# Patient Record
Sex: Female | Born: 1985
Health system: Southern US, Community
[De-identification: ages and names within clinical notes are randomized; demographics above are authoritative.]

## PROBLEM LIST (undated history)

## (undated) ENCOUNTER — Inpatient Hospital Stay (HOSPITAL_COMMUNITY): Payer: Self-pay

## (undated) DIAGNOSIS — Z5189 Encounter for other specified aftercare: Secondary | ICD-10-CM

## (undated) DIAGNOSIS — IMO0002 Reserved for concepts with insufficient information to code with codable children: Secondary | ICD-10-CM

## (undated) HISTORY — DX: Encounter for other specified aftercare: Z51.89

## (undated) HISTORY — PX: WISDOM TOOTH EXTRACTION: SHX21

## (undated) HISTORY — DX: Reserved for concepts with insufficient information to code with codable children: IMO0002

---

## 2006-04-23 DIAGNOSIS — IMO0002 Reserved for concepts with insufficient information to code with codable children: Secondary | ICD-10-CM

## 2006-04-23 DIAGNOSIS — R87619 Unspecified abnormal cytological findings in specimens from cervix uteri: Secondary | ICD-10-CM

## 2006-04-23 HISTORY — DX: Unspecified abnormal cytological findings in specimens from cervix uteri: R87.619

## 2006-04-23 HISTORY — PX: COLPOSCOPY: SHX161

## 2006-04-23 HISTORY — DX: Reserved for concepts with insufficient information to code with codable children: IMO0002

## 2006-05-22 ENCOUNTER — Other Ambulatory Visit: Admission: RE | Admit: 2006-05-22 | Discharge: 2006-05-22 | Payer: Self-pay | Admitting: Obstetrics & Gynecology

## 2006-12-04 ENCOUNTER — Other Ambulatory Visit: Admission: RE | Admit: 2006-12-04 | Discharge: 2006-12-04 | Payer: Self-pay | Admitting: Obstetrics & Gynecology

## 2007-04-25 ENCOUNTER — Other Ambulatory Visit: Admission: RE | Admit: 2007-04-25 | Discharge: 2007-04-25 | Payer: Self-pay | Admitting: Obstetrics & Gynecology

## 2007-10-23 ENCOUNTER — Emergency Department (HOSPITAL_COMMUNITY): Admission: EM | Admit: 2007-10-23 | Discharge: 2007-10-23 | Payer: Self-pay | Admitting: Family Medicine

## 2007-11-06 ENCOUNTER — Other Ambulatory Visit: Admission: RE | Admit: 2007-11-06 | Discharge: 2007-11-06 | Payer: Self-pay | Admitting: Obstetrics and Gynecology

## 2008-04-26 ENCOUNTER — Other Ambulatory Visit: Admission: RE | Admit: 2008-04-26 | Discharge: 2008-04-26 | Payer: Self-pay | Admitting: Obstetrics & Gynecology

## 2008-08-24 ENCOUNTER — Emergency Department (HOSPITAL_COMMUNITY): Admission: EM | Admit: 2008-08-24 | Discharge: 2008-08-24 | Payer: Self-pay | Admitting: Family Medicine

## 2008-10-09 ENCOUNTER — Emergency Department (HOSPITAL_COMMUNITY): Admission: EM | Admit: 2008-10-09 | Discharge: 2008-10-09 | Payer: Self-pay | Admitting: Family Medicine

## 2011-01-18 LAB — CBC
HCT: 41.9
Hemoglobin: 14.4
RBC: 4.54
RDW: 11.9

## 2011-01-18 LAB — POCT RAPID STREP A: Streptococcus, Group A Screen (Direct): NEGATIVE

## 2012-08-15 ENCOUNTER — Encounter: Payer: Self-pay | Admitting: *Deleted

## 2012-08-18 ENCOUNTER — Encounter: Payer: Self-pay | Admitting: Nurse Practitioner

## 2012-08-18 ENCOUNTER — Ambulatory Visit (INDEPENDENT_AMBULATORY_CARE_PROVIDER_SITE_OTHER): Payer: 59 | Admitting: Nurse Practitioner

## 2012-08-18 VITALS — BP 102/70 | HR 68 | Resp 16 | Ht 64.0 in | Wt 134.0 lb

## 2012-08-18 DIAGNOSIS — Z113 Encounter for screening for infections with a predominantly sexual mode of transmission: Secondary | ICD-10-CM

## 2012-08-18 DIAGNOSIS — Z202 Contact with and (suspected) exposure to infections with a predominantly sexual mode of transmission: Secondary | ICD-10-CM

## 2012-08-18 LAB — STD PANEL: HIV: NONREACTIVE

## 2012-08-18 NOTE — Patient Instructions (Signed)
Sexually Transmitted Disease  Sexually transmitted disease (STD) refers to any infection that is passed from person to person during sexual activity. This may happen by way of saliva, semen, blood, vaginal mucus, or urine. Common STDs include:   Gonorrhea.   Chlamydia.   Syphilis.   HIV/AIDS.   Genital herpes.   Hepatitis B and C.   Trichomonas.   Human papillomavirus (HPV).   Pubic lice.  CAUSES   An STD may be spread by bacteria, virus, or parasite. A person can get an STD by:   Sexual intercourse with an infected person.   Sharing sex toys with an infected person.   Sharing needles with an infected person.   Having intimate contact with the genitals, mouth, or rectal areas of an infected person.  SYMPTOMS   Some people may not have any symptoms, but they can still pass the infection to others. Different STDs have different symptoms. Symptoms include:   Painful or bloody urination.   Pain in the pelvis, abdomen, vagina, anus, throat, or eyes.   Skin rash, itching, irritation, growths, or sores (lesions). These usually occur in the genital or anal area.   Abnormal vaginal discharge.   Penile discharge in men.   Soft, flesh-colored skin growths in the genital or anal area.   Fever.   Pain or bleeding during sexual intercourse.   Swollen glands in the groin area.   Yellow skin and eyes (jaundice). This is seen with hepatitis.  DIAGNOSIS   To make a diagnosis, your caregiver may:   Take a medical history.   Perform a physical exam.   Take a specimen (culture) to be examined.   Examine a sample of discharge under a microscope.   Perform blood tests.   Perform a Pap test, if this applies.   Perform a colposcopy.   Perform a laparoscopy.  TREATMENT    Chlamydia, gonorrhea, trichomonas, and syphilis can be cured with antibiotic medicine.   Genital herpes, hepatitis, and HIV can be treated, but not cured, with prescribed medicines. The medicines will lessen the symptoms.   Genital warts  from HPV can be treated with medicine or by freezing, burning (electrocautery), or surgery. Warts may come back.   HPV is a virus and cannot be cured with medicine or surgery.However, abnormal areas may be followed very closely by your caregiver and may be removed from the cervix, vagina, or vulva through office procedures or surgery.  If your diagnosis is confirmed, your recent sexual partners need treatment. This is true even if they are symptom-free or have a negative culture or evaluation. They should not have sex until their caregiver says it is okay.  HOME CARE INSTRUCTIONS   All sexual partners should be informed, tested, and treated for all STDs.   Take your antibiotics as directed. Finish them even if you start to feel better.   Only take over-the-counter or prescription medicines for pain, discomfort, or fever as directed by your caregiver.   Rest.   Eat a balanced diet and drink enough fluids to keep your urine clear or pale yellow.   Do not have sex until treatment is completed and you have followed up with your caregiver. STDs should be checked after treatment.   Keep all follow-up appointments, Pap tests, and blood tests as directed by your caregiver.   Only use latex condoms and water-soluble lubricants during sexual activity. Do not use petroleum jelly or oils.   Avoid alcohol and illegal drugs.   Get vaccinated   for HPV and hepatitis. If you have not received these vaccines in the past, talk to your caregiver about whether one or both might be right for you.   Avoid risky sex practices that can break the skin.  The only way to avoid getting an STD is to avoid all sexual activity.Latex condoms and dental dams (for oral sex) will help lessen the risk of getting an STD, but will not completely eliminate the risk.  SEEK MEDICAL CARE IF:    You have a fever.   You have any new or worsening symptoms.  Document Released: 06/30/2002 Document Revised: 07/02/2011 Document Reviewed:  07/07/2010  [employer] Patient Information 2013 [employer].

## 2012-08-18 NOTE — Progress Notes (Signed)
Subjective:     Patient ID: Lindsey Sellers, female   DOB: August 04, 1985, 27 y.o.   MRN: [id number]  Exposure to STD  The patient's pertinent negatives include no discharge, dyspareunia, dysuria, genital itching, genital lesions, genital rash or pelvic pain. Risk factors: Ended last relatioship of 5 years about 3 months ago.  Now concerned after hearing from other people, that he may not have been faithful.     Review of Systems  Constitutional: Negative.   Respiratory: Negative.   Cardiovascular: Negative.   Gastrointestinal: Negative.   Genitourinary: Positive for vaginal bleeding. Negative for dysuria, urgency, hematuria, vaginal discharge, genital sores, vaginal pain, pelvic pain and dyspareunia.       Menses normal on Loestrin 1/20.  While out of town last week for 2 days took OCP the next morning instead of the usual bedtime dosing.  Vaginal bleeding started this weekend.  Not sexually active X 3 mos.  Musculoskeletal: Negative.   Neurological: Negative.   Psychiatric/Behavioral: Negative.        Objective:   Physical Exam  Constitutional: She appears well-developed and well-nourished.  Cardiovascular: Normal rate.   Pulmonary/Chest: Effort normal.  Abdominal: Soft. Bowel sounds are normal. She exhibits no distension and no mass. There is no tenderness. There is no rebound and no guarding.  Genitourinary:  Light brown vaginal spotting. No pain of bimanual exam.  Musculoskeletal: Normal range of motion.  Neurological: She is alert.  Psychiatric: She has a normal mood and affect. Her behavior is normal. Judgment normal.       Assessment:     Asymptomatic R/O STD's   Ended 5 year relationship who may have been unfaithful Plan:     Will do STD screening and send patient results.

## 2012-08-19 NOTE — Progress Notes (Signed)
Encounter reviewed by Dr. Brook Silva.  

## 2012-08-20 ENCOUNTER — Telehealth: Payer: Self-pay | Admitting: Nurse Practitioner

## 2012-08-20 LAB — IPS N GONORRHOEA AND CHLAMYDIA BY PCR

## 2012-08-20 NOTE — Telephone Encounter (Signed)
Pt is aware of negative (blood) lab results.

## 2012-08-20 NOTE — Telephone Encounter (Signed)
Patient is calling for recent results.  °

## 2012-08-21 ENCOUNTER — Telehealth: Payer: Self-pay | Admitting: *Deleted

## 2012-08-21 ENCOUNTER — Telehealth: Payer: Self-pay | Admitting: Nurse Practitioner

## 2012-08-21 NOTE — Telephone Encounter (Signed)
Encounter opened in error

## 2012-08-21 NOTE — Telephone Encounter (Signed)
LVM (2nd) for pt to return my call for results.  

## 2012-08-21 NOTE — Telephone Encounter (Signed)
Message copied by Osie Bond on Thu Aug 21, 2012 11:45 AM ------      Message from: Vangie Bicker A      Created: Thu Aug 21, 2012 10:52 AM      Regarding: Return Phone Call      Contact: [phone]       Patient is returning your call. ------

## 2012-08-22 ENCOUNTER — Telehealth: Payer: Self-pay | Admitting: *Deleted

## 2012-08-22 NOTE — Progress Notes (Signed)
LVM (2nd) for pt to return my call for results.

## 2012-08-22 NOTE — Telephone Encounter (Signed)
Pt is aware of lab results.

## 2012-08-22 NOTE — Telephone Encounter (Signed)
Message copied by Osie Bond on Fri Aug 22, 2012  4:33 PM ------      Message from: Ria Comment R      Created: Thu Aug 21, 2012  8:33 AM       Let pt. Know negative. ------

## 2012-08-22 NOTE — Progress Notes (Signed)
Pt is aware of lab results.

## 2012-08-22 NOTE — Telephone Encounter (Signed)
Message copied by Osie Bond on Fri Aug 22, 2012  4:33 PM ------      Message from: Ria Comment R      Created: Tue Aug 19, 2012  9:19 AM       Hold these results until Gc/Chl ------

## 2013-02-26 ENCOUNTER — Other Ambulatory Visit: Payer: Self-pay

## 2013-05-21 ENCOUNTER — Other Ambulatory Visit: Payer: Self-pay | Admitting: *Deleted

## 2013-05-21 MED ORDER — NORETHIN ACE-ETH ESTRAD-FE 1-20 MG-MCG PO TABS: 1.0000 | ORAL_TABLET | Freq: Every day | ORAL | Status: DC

## 2013-05-21 NOTE — Telephone Encounter (Signed)
Last AEX 05/2012 Last refill 06/16/2012 Next appt 06/18/2013  Will refill 1 month until appt.

## 2013-06-18 ENCOUNTER — Ambulatory Visit: Payer: Self-pay | Admitting: Nurse Practitioner

## 2013-06-29 ENCOUNTER — Ambulatory Visit (INDEPENDENT_AMBULATORY_CARE_PROVIDER_SITE_OTHER): Payer: 59 | Admitting: Certified Nurse Midwife

## 2013-06-29 ENCOUNTER — Encounter: Payer: Self-pay | Admitting: Certified Nurse Midwife

## 2013-06-29 VITALS — BP 114/77 | HR 83 | Resp 16 | Ht 63.75 in | Wt 138.0 lb

## 2013-06-29 DIAGNOSIS — Z309 Encounter for contraceptive management, unspecified: Secondary | ICD-10-CM

## 2013-06-29 DIAGNOSIS — Z01419 Encounter for gynecological examination (general) (routine) without abnormal findings: Secondary | ICD-10-CM

## 2013-06-29 MED ORDER — LEVONORGEST-ETH ESTRAD 91-DAY 0.15-0.03 &0.01 MG PO TABS: 1.0000 | ORAL_TABLET | Freq: Every day | ORAL | Status: DC

## 2013-06-29 NOTE — Progress Notes (Signed)
28 y.o. G0P0 Single Caucasian Fe here for annual exam. Periods slightly heavier and more PMS the past few cycles. No partner change, no STD concerns of testing desired. Patient would like to try extended cycle OCP to decrease PMS occurrence. Sees PCP prn. No other health issues today.    Patient's last menstrual period was 06/22/2013.          Sexually active: yes  The current method of family planning is OCP (estrogen/progesterone).    Exercising: yes  yoga,aerobics & walking Smoker:  no  Health Maintenance: Pap: 06-15-11 neg MMG:  none Colonoscopy:  none BMD:   none TDaP:  2010 Labs: none Self breast exam: occ   reports that she has never smoked. She has never used smokeless tobacco. She reports that she drinks about 0.5 ounces of alcohol per week. She reports that she does not use illicit drugs.  Past Medical History  Diagnosis Date  . Abnormal pap 04/2006  . 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 Colposcopy  2008    no biopsy done    Current Outpatient Prescriptions  Medication Sig Dispense Refill  . Multiple Vitamins-Minerals (MULTIVITAMIN PO) Take by mouth daily.      . norethindrone-ethinyl estradiol (JUNEL FE,GILDESS FE,LOESTRIN FE) 1-20 MG-MCG tablet Take 1 tablet by mouth daily.  1 Package  0   No current facility-administered medications for this visit.    Family History  Problem Relation Age of Onset  . Cancer Paternal Grandmother     lymph node    ROS:  Pertinent items are noted in HPI.  Otherwise, a comprehensive ROS was negative.  Exam:   BP 114/77  Pulse 83  Resp 16  Ht 5' 3.75" (1.619 m)  Wt 138 lb (62.596 kg)  BMI 23.88 kg/m2  LMP 06/22/2013 Height: 5' 3.75" (161.9 cm)  Ht Readings from Last 3 Encounters:  06/29/13 5' 3.75" (1.619 m)  08/18/12 5\' 4"  (1.626 m)    General appearance: alert, cooperative and appears stated age Head: Normocephalic,  without obvious abnormality, atraumatic Neck: no adenopathy, supple, symmetrical, trachea midline and thyroid normal to inspection and palpation and non-palpable Lungs: clear to auscultation bilaterally Breasts: normal appearance, no masses or tenderness, No nipple retraction or dimpling, No nipple discharge or bleeding, No axillary or supraclavicular adenopathy Heart: regular rate and rhythm Abdomen: soft, non-tender; no masses,  no organomegaly Extremities: extremities normal, atraumatic, no cyanosis or edema Skin: Skin color, texture, turgor normal. No rashes or lesions Lymph nodes: Cervical, supraclavicular, and axillary nodes normal. No abnormal inguinal nodes palpated Neurologic: Grossly normal   Pelvic: External genitalia:  no lesions              Urethra:  normal appearing urethra with no masses, tenderness or lesions              Bartholin's and Skene's: normal                 Vagina: normal appearing vagina with normal color and discharge, no lesions              Cervix: normal, non tender              Pap taken: no Bimanual Exam:  Uterus:  normal size, contour, position, consistency, mobility, non-tender and anteverted              Adnexa: normal adnexa and no  mass, fullness, tenderness               Rectovaginal: Confirms               Anus:  Deferred  A:  Well Woman with normal exam  Contraception desires extended cycle OCP trial, previous OCP use   History of abnormal pap LSIL in 2009 repeat pap follow up  P:   Reviewed health and wellness pertinent to exam  Rx Seasonique see order with instructions to keep menses calendar and advise if problems. Bleeding expectations given with extended cycle OCP. Questions addressed.  Pap smear as per guidelines   pap smear not taken today counseled on breast self exam, STD prevention, HIV risk factors and prevention, use and side effects of OCP's, adequate intake of calcium and vitamin D, diet and exercise  return annually or  prn  An After Visit Summary was printed and given to the patient.

## 2013-06-29 NOTE — Patient Instructions (Signed)
General topics  Next pap or exam is  due in 1 year Take a Women's multivitamin Take 1200 mg. of calcium daily - prefer dietary If any concerns in interim to call back  Breast Self-Awareness Practicing breast self-awareness may pick up problems early, prevent significant medical complications, and possibly save your life. By practicing breast self-awareness, you can become familiar with how your breasts look and feel and if your breasts are changing. This allows you to notice changes early. It can also offer you some reassurance that your breast health is good. One way to learn what is normal for your breasts and whether your breasts are changing is to do a breast self-exam. If you find a lump or something that was not present in the past, it is best to contact your caregiver right away. Other findings that should be evaluated by your caregiver include nipple discharge, especially if it is bloody; skin changes or reddening; areas where the skin seems to be pulled in (retracted); or new lumps and bumps. Breast pain is seldom associated with cancer (malignancy), but should also be evaluated by a caregiver. BREAST SELF-EXAM The best time to examine your breasts is 5 7 days after your menstrual period is over.  ExitCare Patient Information 2013 ExitCare[employer] LLC.   Exercise to Stay Healthy Exercise helps you become and stay healthy. EXERCISE IDEAS AND TIPS Choose exercises that:  You enjoy.  Fit into your day. You do not need to exercise really hard to be healthy. You can do exercises at a slow or medium level and stay healthy. You can:  Stretch before and after working out.  Try yoga, Pilates, or tai chi.  Lift weights.  Walk fast, swim, jog, run, climb stairs, bicycle, dance, or rollerskate.  Take aerobic classes. Exercises that burn about 150 calories:  Running 1  miles in 15 minutes.  Playing volleyball for 45 to 60 minutes.  Washing and waxing a car for 45 to 60  minutes.  Playing touch football for 45 minutes.  Walking 1  miles in 35 minutes.  Pushing a stroller 1  miles in 30 minutes.  Playing basketball for 30 minutes.  Raking leaves for 30 minutes.  Bicycling 5 miles in 30 minutes.  Walking 2 miles in 30 minutes.  Dancing for 30 minutes.  Shoveling snow for 15 minutes.  Swimming laps for 20 minutes.  Walking up stairs for 15 minutes.  Bicycling 4 miles in 15 minutes.  Gardening for 30 to 45 minutes.  Jumping rope for 15 minutes.  Washing windows or floors for 45 to 60 minutes. Document Released: 05/12/2010 Document Revised: 07/02/2011 Document Reviewed: 05/12/2010 ExitCare Patient Information 2013 ExitCare[employer] LLC.   Other topics ( that may be useful information):    Sexually Transmitted Disease Sexually transmitted disease (STD) refers to any infection that is passed from person to person during sexual activity. This may happen by way of saliva, semen, blood, vaginal mucus, or urine. Common STDs include:  Gonorrhea.  Chlamydia.  Syphilis.  HIV/AIDS.  Genital herpes.  Hepatitis B and C.  Trichomonas.  Human papillomavirus (HPV).  Pubic lice. CAUSES  An STD may be spread by bacteria, virus, or parasite. A person can get an STD by:  Sexual intercourse with an infected person.  Sharing sex toys with an infected person.  Sharing needles with an infected person.  Having intimate contact with the genitals, mouth, or rectal areas of an infected person. SYMPTOMS  Some people may not have any symptoms, but   they can still pass the infection to others. Different STDs have different symptoms. Symptoms include:  Painful or bloody urination.  Pain in the pelvis, abdomen, vagina, anus, throat, or eyes.  Skin rash, itching, irritation, growths, or sores (lesions). These usually occur in the genital or anal area.  Abnormal vaginal discharge.  Penile discharge in men.  Soft, flesh-colored skin growths in the  genital or anal area.  Fever.  Pain or bleeding during sexual intercourse.  Swollen glands in the groin area.  Yellow skin and eyes (jaundice). This is seen with hepatitis. DIAGNOSIS  To make a diagnosis, your caregiver may:  Take a medical history.  Perform a physical exam.  Take a specimen (culture) to be examined.  Examine a sample of discharge under a microscope.  Perform blood test TREATMENT   Chlamydia, gonorrhea, trichomonas, and syphilis can be cured with antibiotic medicine.  Genital herpes, hepatitis, and HIV can be treated, but not cured, with prescribed medicines. The medicines will lessen the symptoms.  Genital warts from HPV can be treated with medicine or by freezing, burning (electrocautery), or surgery. Warts may come back.  HPV is a virus and cannot be cured with medicine or surgery.However, abnormal areas may be followed very closely by your caregiver and may be removed from the cervix, vagina, or vulva through office procedures or surgery. If your diagnosis is confirmed, your recent sexual partners need treatment. This is true even if they are symptom-free or have a negative culture or evaluation. They should not have sex until their caregiver says it is okay. HOME CARE INSTRUCTIONS  All sexual partners should be informed, tested, and treated for all STDs.  Take your antibiotics as directed. Finish them even if you start to feel better.  Only take over-the-counter or prescription medicines for pain, discomfort, or fever as directed by your caregiver.  Rest.  Eat a balanced diet and drink enough fluids to keep your urine clear or pale yellow.  Do not have sex until treatment is completed and you have followed up with your caregiver. STDs should be checked after treatment.  Keep all follow-up appointments, Pap tests, and blood tests as directed by your caregiver.  Only use latex condoms and water-soluble lubricants during sexual activity. Do not use  petroleum jelly or oils.  Avoid alcohol and illegal drugs.  Get vaccinated for HPV and hepatitis. If you have not received these vaccines in the past, talk to your caregiver about whether one or both might be right for you.  Avoid risky sex practices that can break the skin. The only way to avoid getting an STD is to avoid all sexual activity.Latex condoms and dental dams (for oral sex) will help lessen the risk of getting an STD, but will not completely eliminate the risk. SEEK MEDICAL CARE IF:   You have a fever.  You have any new or worsening symptoms. Document Released: 06/30/2002 Document Revised: 07/02/2011 Document Reviewed: 07/07/2010 [facility] Patient Information 2013 [place].    Domestic Abuse You are being battered or abused if someone close to you hits, pushes, or physically hurts you in any way. You also are being abused if you are forced into activities. You are being sexually abused if you are forced to have sexual contact of any kind. You are being emotionally abused if you are made to feel worthless or if you are constantly threatened. It is important to remember that help is available. No one has the right to abuse you. PREVENTION OF FURTHER  ABUSE  Learn the warning signs of danger. This varies with situations but may include: the use of alcohol, threats, isolation from friends and family, or forced sexual contact. Leave if you feel that violence is going to occur.  If you are attacked or beaten, report it to the police so the abuse is documented. You do not have to press charges. The police can protect you while you or the attackers are leaving. Get the officer's name and badge number and a copy of the report.  Find someone you can trust and tell them what is happening to you: your caregiver, a nurse, clergy member, close friend or family member. Feeling ashamed is natural, but remember that you have done nothing wrong. No one deserves abuse. Document Released:  04/06/2000 Document Revised: 07/02/2011 Document Reviewed: 06/15/2010 ExitCare Patient Information 2013 ExitCare, LLC.    How Much is Too Much Alcohol? Drinking too much alcohol can cause injury, accidents, and health problems. These types of problems can include:   Car crashes.  Falls.  Family fighting (domestic violence).  Drowning.  Fights.  Injuries.  Burns.  Damage to certain organs.  Having a baby with birth defects. ONE DRINK CAN BE TOO MUCH WHEN YOU ARE:  Working.  Pregnant or breastfeeding.  Taking medicines. Ask your doctor.  Driving or planning to drive. If you or someone you know has a drinking problem, get help from a doctor.  Document Released: 02/03/2009 Document Revised: 07/02/2011 Document Reviewed: 02/03/2009 ExitCare Patient Information 2013 ExitCare, LLC.   Smoking Hazards Smoking cigarettes is extremely bad for your health. Tobacco smoke has over 200 known poisons in it. There are over 60 chemicals in tobacco smoke that cause cancer. Some of the chemicals found in cigarette smoke include:   Cyanide.  Benzene.  Formaldehyde.  Methanol (wood alcohol).  Acetylene (fuel used in welding torches).  Ammonia. Cigarette smoke also contains the poisonous gases nitrogen oxide and carbon monoxide.  Cigarette smokers have an increased risk of many serious medical problems and Smoking causes approximately:  90% of all lung cancer deaths in men.  80% of all lung cancer deaths in women.  90% of deaths from chronic obstructive lung disease. Compared with nonsmokers, smoking increases the risk of:  Coronary heart disease by 2 to 4 times.  Stroke by 2 to 4 times.  Men developing lung cancer by 23 times.  Women developing lung cancer by 13 times.  Dying from chronic obstructive lung diseases by 12 times.  . Smoking is the most preventable cause of death and disease in our society.  WHY IS SMOKING ADDICTIVE?  Nicotine is the chemical  agent in tobacco that is capable of causing addiction or dependence.  When you smoke and inhale, nicotine is absorbed rapidly into the bloodstream through your lungs. Nicotine absorbed through the lungs is capable of creating a powerful addiction. Both inhaled and non-inhaled nicotine may be addictive.  Addiction studies of cigarettes and spit tobacco show that addiction to nicotine occurs mainly during the teen years, when young people begin using tobacco products. WHAT ARE THE BENEFITS OF QUITTING?  There are many health benefits to quitting smoking.   Likelihood of developing cancer and heart disease decreases. Health improvements are seen almost immediately.  Blood pressure, pulse rate, and breathing patterns start returning to normal soon after quitting. QUITTING SMOKING   American Lung Association - 1-800-LUNGUSA  American Cancer Society - 1-800-ACS-2345 Document Released: 05/17/2004 Document Revised: 07/02/2011 Document Reviewed: 01/19/2009 ExitCare Patient Information 2013 ExitCare,   LLC.   Stress Management Stress is a state of physical or mental tension that often results from changes in your life or normal routine. Some common causes of stress are:  Death of a loved one.  Injuries or severe illnesses.  Getting fired or changing jobs.  Moving into a new home. Other causes may be:  Sexual problems.  Business or financial losses.  Taking on a large debt.  Regular conflict with someone at home or at work.  Constant tiredness from lack of sleep. It is not just bad things that are stressful. It may be stressful to:  Win the lottery.  Get married.  Buy a new car. The amount of stress that can be easily tolerated varies from person to person. Changes generally cause stress, regardless of the types of change. Too much stress can affect your health. It may lead to physical or emotional problems. Too little stress (boredom) may also become stressful. SUGGESTIONS TO  REDUCE STRESS:  Talk things over with your family and friends. It often is helpful to share your concerns and worries. If you feel your problem is serious, you may want to get help from a professional counselor.  Consider your problems one at a time instead of lumping them all together. Trying to take care of everything at once may seem impossible. List all the things you need to do and then start with the most important one. Set a goal to accomplish 2 or 3 things each day. If you expect to do too many in a single day you will naturally fail, causing you to feel even more stressed.  Do not use alcohol or drugs to relieve stress. Although you may feel better for a short time, they do not remove the problems that caused the stress. They can also be habit forming.  Exercise regularly - at least 3 times per week. Physical exercise can help to relieve that "uptight" feeling and will relax you.  The shortest distance between despair and hope is often a good night's sleep.  Go to bed and get up on time allowing yourself time for appointments without being rushed.  Take a short "time-out" period from any stressful situation that occurs during the day. Close your eyes and take some deep breaths. Starting with the muscles in your face, tense them, hold it for a few seconds, then relax. Repeat this with the muscles in your neck, shoulders, hand, stomach, back and legs.  Take good care of yourself. Eat a balanced diet and get plenty of rest.  Schedule time for having fun. Take a break from your daily routine to relax. HOME CARE INSTRUCTIONS   Call if you feel overwhelmed by your problems and feel you can no longer manage them on your own.  Return immediately if you feel like hurting yourself or someone else. Document Released: 10/03/2000 Document Revised: 07/02/2011 Document Reviewed: 05/26/2007 [facility] Patient Information 2013 [place].  Start new pack of pills today as discussed. Call with  any concerns, please call. Have a great spring! Debbi

## 2013-06-29 NOTE — Progress Notes (Signed)
Reviewed personally.  M. Suzanne Lisseth Brazeau, MD.  

## 2013-12-09 ENCOUNTER — Telehealth: Payer: Self-pay | Admitting: Nurse Practitioner

## 2013-12-09 NOTE — Telephone Encounter (Signed)
Patient wants to switch her bc

## 2013-12-09 NOTE — Telephone Encounter (Signed)
Left message to call Timouthy Gilardi at [phone]. 

## 2013-12-09 NOTE — Telephone Encounter (Signed)
There is not another oral pill option for 3 month periods. She can consider IUD or Nexplanon which can sometimes have no periods. Probably needs OV to discuss if she wants to change. Can schedule for next week

## 2013-12-09 NOTE — Telephone Encounter (Signed)
Spoke with patient. Patient states that she is on seasonique and is nearing the end of being on it for six months. Patient states that she is still having break through bleeding and has had increased weight gain. Patient would like to know if there is another option similar to the seasonique where she would only have a cycle once every three months where she would not have these symptoms. Would like to try something different. Advised would send a message over to Regina Eck CNM and give patient a call back with further recommendations. Patient agreeable.

## 2013-12-10 ENCOUNTER — Other Ambulatory Visit: Payer: Self-pay | Admitting: Certified Nurse Midwife

## 2013-12-10 ENCOUNTER — Telehealth: Payer: Self-pay | Admitting: Certified Nurse Midwife

## 2013-12-10 MED ORDER — LEVONORGEST-ETH ESTRAD 91-DAY 0.15-0.03 &0.01 MG PO TABS: 1.0000 | ORAL_TABLET | Freq: Every day | ORAL | Status: DC

## 2013-12-10 NOTE — Telephone Encounter (Signed)
Spoke with patient. Patient states that she called the pharmacy to see how much it was going to be for her brand only seasonique. States that it will be $300. Patient has a friend who is seen at our practice who is on Camrese. Patient would like to know if this would be an option of something she could try since it is also a three month pill. Advised would send a message over to Regina Eck CNM and give patient a call back with further recommendations and instructions. Patient agreeable.

## 2013-12-10 NOTE — Telephone Encounter (Signed)
Spoke with patient. Rx sent for Camrese #1 0RF to pharmacy on file. Advised patient to call back at the end of the three month pack to let us know how she is doing on the new OCP for refills. Patient agreeable and verbalizes understanding.  Routing to provider for final review. Patient agreeable to disposition. Will close encounter ;

## 2013-12-10 NOTE — Telephone Encounter (Signed)
That would be fine for her to try.

## 2013-12-10 NOTE — Telephone Encounter (Signed)
Patient is asking to talk with [facility] regarding her prescription.

## 2013-12-10 NOTE — Telephone Encounter (Signed)
Spoke with patient. Patient states that she is not interested in an IUD or nexplanon at this time. Patient states "Can't I be switched to the brand of this pill to see if that helps?" Advised patient could switch rx over to brand only if she would like. Patient would like to see if this will make a difference. Advised would send that rx in now. Patient would like to know how much it will cost per month. Advised will have to contact pharmacy so they can run it with her insurance and tell her how much it will be. Patient agreeable. Rx sent for [facility] with comment to dispense brand only rx.  Routing to provider for final review. Patient agreeable to disposition. Will close encounter.

## 2014-03-16 ENCOUNTER — Other Ambulatory Visit: Payer: Self-pay | Admitting: Certified Nurse Midwife

## 2014-03-17 NOTE — Telephone Encounter (Signed)
As of 12/10/13 patient switched to camrese and #1 pack was sent to pharmacy. The patient was supposed to give a update at the end of three months to let us know how she is doing.   Called patient and got update, she said she is feeling much better than the other pill she was on. Been feeling great on it, and she hasn't gained extra weight would like to continue.  Please advise [name] okay to send remainder of the year her last AEX was 06/29/13;  AEX scheduled for 07/01/14.  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

## 2014-04-07 ENCOUNTER — Encounter: Payer: Self-pay | Admitting: Family

## 2014-04-07 ENCOUNTER — Ambulatory Visit (INDEPENDENT_AMBULATORY_CARE_PROVIDER_SITE_OTHER): Payer: 59 | Admitting: Family

## 2014-04-07 VITALS — BP 118/78 | HR 81 | Temp 97.8°F | Resp 16 | Ht 64.5 in | Wt 143.4 lb

## 2014-04-07 DIAGNOSIS — Z0000 Encounter for general adult medical examination without abnormal findings: Secondary | ICD-10-CM

## 2014-04-07 LAB — URINALYSIS, ROUTINE W REFLEX MICROSCOPIC
BILIRUBIN URINE: NEGATIVE
Hgb urine dipstick: NEGATIVE
KETONES UR: NEGATIVE
Leukocytes, UA: NEGATIVE
Nitrite: NEGATIVE
PH: 6 (ref 5.0–8.0)
Total Protein, Urine: NEGATIVE
URINE GLUCOSE: NEGATIVE
Urobilinogen, UA: 0.2 (ref 0.0–1.0)

## 2014-04-07 LAB — HEPATIC FUNCTION PANEL
ALK PHOS: 28 U/L — AB (ref 39–117)
ALT: 21 U/L (ref 0–35)
AST: 16 U/L (ref 0–37)
Albumin: 4.4 g/dL (ref 3.5–5.2)
BILIRUBIN DIRECT: 0.1 mg/dL (ref 0.0–0.3)
Total Bilirubin: 0.3 mg/dL (ref 0.2–1.2)
Total Protein: 7.3 g/dL (ref 6.0–8.3)

## 2014-04-07 LAB — BASIC METABOLIC PANEL
BUN: 8 mg/dL (ref 6–23)
CALCIUM: 9.5 mg/dL (ref 8.4–10.5)
CO2: 24 meq/L (ref 19–32)
CREATININE: 0.7 mg/dL (ref 0.4–1.2)
Chloride: 105 mEq/L (ref 96–112)
GFR: 97.48 mL/min (ref >60.00)
Glucose, Bld: 72 mg/dL (ref 70–99)
Potassium: 4.1 mEq/L (ref 3.5–5.1)
Sodium: 139 mEq/L (ref 135–145)

## 2014-04-07 LAB — LIPID PANEL
CHOLESTEROL: 172 mg/dL (ref 0–200)
HDL: 69.1 mg/dL (ref >39.00)
LDL CALC: 80 mg/dL (ref 0–99)
NonHDL: 102.9
Total CHOL/HDL Ratio: 2
Triglycerides: 113 mg/dL (ref 0.0–149.0)
VLDL: 22.6 mg/dL (ref 0.0–40.0)

## 2014-04-07 LAB — CBC WITH DIFFERENTIAL/PLATELET
BASOS ABS: 0 10*3/uL (ref 0.0–0.1)
Basophils Relative: 0.3 % (ref 0.0–3.0)
EOS ABS: 0.1 10*3/uL (ref 0.0–0.7)
Eosinophils Relative: 1.4 % (ref 0.0–5.0)
HEMATOCRIT: 43.5 % (ref 36.0–46.0)
HEMOGLOBIN: 14.2 g/dL (ref 12.0–15.0)
LYMPHS ABS: 2 10*3/uL (ref 0.7–4.0)
Lymphocytes Relative: 28.3 % (ref 12.0–46.0)
MCHC: 32.6 g/dL (ref 30.0–36.0)
MCV: 96.9 fl (ref 78.0–100.0)
MONO ABS: 0.4 10*3/uL (ref 0.1–1.0)
MONOS PCT: 5.2 % (ref 3.0–12.0)
NEUTROS ABS: 4.7 10*3/uL (ref 1.4–7.7)
Neutrophils Relative %: 64.8 % (ref 43.0–77.0)
Platelets: 229 10*3/uL (ref 150.0–400.0)
RBC: 4.49 Mil/uL (ref 3.87–5.11)
RDW: 12.6 % (ref 11.5–15.5)
WBC: 7.2 10*3/uL (ref 4.0–10.5)

## 2014-04-07 LAB — TSH: TSH: 2.1 u[IU]/mL (ref 0.35–4.50)

## 2014-04-07 NOTE — Assessment & Plan Note (Signed)
Will obtain routine lab work. Immunizations reviewed up to date. Pap up to date- per gyn.

## 2014-04-07 NOTE — Progress Notes (Signed)
Subjective:    Patient ID: Lindsey Sellers, female    DOB: June 06, 1985, 28 y.o.   MRN: [id number]  HPI  [name] is a 28 yr old female who presents today to establish care.   Patient presents today for complete physical.  Immunizations: up to date Diet: reports diet is good Exercise: enjoys fitness classes at [employer] 2-3 times a week Pap Smear: per gyn last pap was 2 years ago. Has hx of abnormal pap (remotely 7 years ago) she had colposcopy Mammogram: Eye: up to date on eye exam Dental: up to date   Review of Systems  Constitutional:       Had some weight gain with change in OCP, but now stable.   HENT: Negative for hearing loss and rhinorrhea.   Eyes: Negative for visual disturbance.  Respiratory: Negative for cough.   Cardiovascular: Negative for leg swelling.  Gastrointestinal: Negative for nausea, vomiting, diarrhea and constipation.  Genitourinary: Negative for dysuria, frequency and menstrual problem.  Musculoskeletal: Negative for myalgias and arthralgias.  Skin: Negative for rash.  Neurological: Negative for headaches.  Hematological: Negative for adenopathy.  Psychiatric/Behavioral:       Denies depression. Mild anxiety at work   Past Medical History  Diagnosis Date  . Abnormal pap 04/2006  . 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 Marital Status: Single    Spouse Name: N/A    Number of Children: N/A  . Years of Education: N/A   Occupational History  . Not on file.   Social History Main Topics  . Smoking status: Never Smoker   . Smokeless tobacco: Never Used  . Alcohol Use: 0.6 - 1.2 oz/week    1-2 Not specified per week     Comment: occasional  . Drug Use: No  . Sexual Activity:    Partners: Male    Birth Control/ Protection: Pill   Other Topics Concern  . Not on file   Social History Narrative   Lives with roommate, has a boyfriend   Team lead for registration at [employer]    Grew up locally   Completed graduate degree   Enjoys reading   4 cats    Past Surgical History  Procedure Laterality Date  . Colposcopy  2008    no biopsy done  . Wisdom tooth extraction      Family History  Problem Relation Age of Onset  . Cancer Paternal Grandmother     lymph node  . Gout Father     No Known Allergies  Current Outpatient Prescriptions on File Prior to Visit  Medication Sig Dispense Refill  . CAMRESE 0.15-0.03 &0.01 MG tablet TAKE 1 TABLET BY MOUTH DAILY. 1 Package 3  . Multiple Vitamins-Minerals (MULTIVITAMIN PO) Take by mouth daily.     No current facility-administered medications on file prior to visit.    BP 118/78 mmHg  Pulse 81  Temp(Src) 97.8 F (36.6 C) (Oral)  Resp 16  Ht 5' 4.5" (1.638 m)  Wt 143 lb 6.4 oz (65.046 kg)  BMI 24.24 kg/m2  SpO2 99%  LMP 03/28/2014       Objective:   Physical Exam  Physical Exam  Constitutional: She is oriented to person, place, and time. She appears well-developed and well-nourished. No distress.  HENT:  Head: Normocephalic and atraumatic.  Right Ear: Tympanic membrane and ear canal normal.  Left Ear: Tympanic membrane and ear canal  normal.  Mouth/Throat: Oropharynx is clear and moist.  Eyes: Pupils are equal, round.  No scleral icterus. EOM intact Neck: Normal range of motion. No thyromegaly present.  Cardiovascular: Normal rate and regular rhythm.   No murmur heard. Pulmonary/Chest: Effort normal and breath sounds normal. No respiratory distress. He has no wheezes. She has no rales. She exhibits no tenderness.  Abdominal: Soft. Bowel sounds are normal. He exhibits no distension and no mass. There is no tenderness. There is no rebound and no guarding.  Musculoskeletal: She exhibits no edema.  Lymphadenopathy:    She has no cervical adenopathy.  Neurological: She is alert and oriented to person, place, and time. She has normal patellar reflexes. She exhibits normal muscle tone. Coordination  normal.  Skin: Skin is warm and dry.  Psychiatric: She has a normal mood and affect. Her behavior is normal. Judgment and thought content normal.  Breast/pelvic: deferred to GYN        Assessment & Plan:          Assessment & Plan:

## 2014-04-07 NOTE — Progress Notes (Signed)
   Subjective:    Patient ID: Lindsey Sellers, female    DOB: 06-30-1985, 28 y.o.   MRN: [id number]  HPI    Review of Systems     Objective:   Physical Exam        Assessment & Plan:

## 2014-04-07 NOTE — Progress Notes (Signed)
Pre visit review using our clinic review tool, if applicable. No additional management support is needed unless otherwise documented below in the visit note. 

## 2014-04-07 NOTE — Patient Instructions (Signed)
Please complete lab work prior to leaving. Follow up in 1 year, sooner if problems/concerns. Welcome to [facility]!

## 2014-04-07 NOTE — Addendum Note (Signed)
Addended by: Debbrah Alar on: 04/07/2014 08:06 AM   Modules accepted: Orders

## 2014-04-08 ENCOUNTER — Encounter: Payer: Self-pay | Admitting: Family

## 2014-07-01 ENCOUNTER — Ambulatory Visit (INDEPENDENT_AMBULATORY_CARE_PROVIDER_SITE_OTHER): Payer: 59 | Admitting: Certified Nurse Midwife

## 2014-07-01 ENCOUNTER — Encounter: Payer: Self-pay | Admitting: Certified Nurse Midwife

## 2014-07-01 VITALS — BP 110/74 | HR 68 | Resp 16 | Ht 63.5 in | Wt 139.0 lb

## 2014-07-01 DIAGNOSIS — Z124 Encounter for screening for malignant neoplasm of cervix: Secondary | ICD-10-CM

## 2014-07-01 DIAGNOSIS — Z01419 Encounter for gynecological examination (general) (routine) without abnormal findings: Secondary | ICD-10-CM

## 2014-07-01 DIAGNOSIS — Z3041 Encounter for surveillance of contraceptive pills: Secondary | ICD-10-CM | POA: Diagnosis not present

## 2014-07-01 MED ORDER — LEVONORGEST-ETH ESTRAD 91-DAY 0.15-0.03 &0.01 MG PO TABS: 1.0000 | ORAL_TABLET | Freq: Every day | ORAL | Status: DC

## 2014-07-01 NOTE — Patient Instructions (Signed)
General topics  Next pap or exam is  due in 1 year Take a Women's multivitamin Take 1200 mg. of calcium daily - prefer dietary If any concerns in interim to call back  Breast Self-Awareness Practicing breast self-awareness may pick up problems early, prevent significant medical complications, and possibly save your life. By practicing breast self-awareness, you can become familiar with how your breasts look and feel and if your breasts are changing. This allows you to notice changes early. It can also offer you some reassurance that your breast health is good. One way to learn what is normal for your breasts and whether your breasts are changing is to do a breast self-exam. If you find a lump or something that was not present in the past, it is best to contact your caregiver right away. Other findings that should be evaluated by your caregiver include nipple discharge, especially if it is bloody; skin changes or reddening; areas where the skin seems to be pulled in (retracted); or new lumps and bumps. Breast pain is seldom associated with cancer (malignancy), but should also be evaluated by a caregiver. BREAST SELF-EXAM The best time to examine your breasts is 5 7 days after your menstrual period is over.  ExitCare Patient Information 2013 [employer].   Exercise to Stay Healthy Exercise helps you become and stay healthy. EXERCISE IDEAS AND TIPS Choose exercises that:  You enjoy.  Fit into your day. You do not need to exercise really hard to be healthy. You can do exercises at a slow or medium level and stay healthy. You can:  Stretch before and after working out.  Try yoga, Pilates, or tai chi.  Lift weights.  Walk fast, swim, jog, run, climb stairs, bicycle, dance, or rollerskate.  Take aerobic classes. Exercises that burn about 150 calories:  Running 1  miles in 15 minutes.  Playing volleyball for 45 to 60 minutes.  Washing and waxing a car for 45 to 60  minutes.  Playing touch football for 45 minutes.  Walking 1  miles in 35 minutes.  Pushing a stroller 1  miles in 30 minutes.  Playing basketball for 30 minutes.  Raking leaves for 30 minutes.  Bicycling 5 miles in 30 minutes.  Walking 2 miles in 30 minutes.  Dancing for 30 minutes.  Shoveling snow for 15 minutes.  Swimming laps for 20 minutes.  Walking up stairs for 15 minutes.  Bicycling 4 miles in 15 minutes.  Gardening for 30 to 45 minutes.  Jumping rope for 15 minutes.  Washing windows or floors for 45 to 60 minutes. Document Released: 05/12/2010 Document Revised: 07/02/2011 Document Reviewed: 05/12/2010 ExitCare Patient Information 2013 [employer].   Other topics ( that may be useful information):    Sexually Transmitted Disease Sexually transmitted disease (STD) refers to any infection that is passed from person to person during sexual activity. This may happen by way of saliva, semen, blood, vaginal mucus, or urine. Common STDs include:  Gonorrhea.  Chlamydia.  Syphilis.  HIV/AIDS.  Genital herpes.  Hepatitis B and C.  Trichomonas.  Human papillomavirus (HPV).  Pubic lice. CAUSES  An STD may be spread by bacteria, virus, or parasite. A person can get an STD by:  Sexual intercourse with an infected person.  Sharing sex toys with an infected person.  Sharing needles with an infected person.  Having intimate contact with the genitals, mouth, or rectal areas of an infected person. SYMPTOMS  Some people may not have any symptoms, but   they can still pass the infection to others. Different STDs have different symptoms. Symptoms include:  Painful or bloody urination.  Pain in the pelvis, abdomen, vagina, anus, throat, or eyes.  Skin rash, itching, irritation, growths, or sores (lesions). These usually occur in the genital or anal area.  Abnormal vaginal discharge.  Penile discharge in men.  Soft, flesh-colored skin growths in the  genital or anal area.  Fever.  Pain or bleeding during sexual intercourse.  Swollen glands in the groin area.  Yellow skin and eyes (jaundice). This is seen with hepatitis. DIAGNOSIS  To make a diagnosis, your caregiver may:  Take a medical history.  Perform a physical exam.  Take a specimen (culture) to be examined.  Examine a sample of discharge under a microscope.  Perform blood test TREATMENT   Chlamydia, gonorrhea, trichomonas, and syphilis can be cured with antibiotic medicine.  Genital herpes, hepatitis, and HIV can be treated, but not cured, with prescribed medicines. The medicines will lessen the symptoms.  Genital warts from HPV can be treated with medicine or by freezing, burning (electrocautery), or surgery. Warts may come back.  HPV is a virus and cannot be cured with medicine or surgery.However, abnormal areas may be followed very closely by your caregiver and may be removed from the cervix, vagina, or vulva through office procedures or surgery. If your diagnosis is confirmed, your recent sexual partners need treatment. This is true even if they are symptom-free or have a negative culture or evaluation. They should not have sex until their caregiver says it is okay. HOME CARE INSTRUCTIONS  All sexual partners should be informed, tested, and treated for all STDs.  Take your antibiotics as directed. Finish them even if you start to feel better.  Only take over-the-counter or prescription medicines for pain, discomfort, or fever as directed by your caregiver.  Rest.  Eat a balanced diet and drink enough fluids to keep your urine clear or pale yellow.  Do not have sex until treatment is completed and you have followed up with your caregiver. STDs should be checked after treatment.  Keep all follow-up appointments, Pap tests, and blood tests as directed by your caregiver.  Only use latex condoms and water-soluble lubricants during sexual activity. Do not use  petroleum jelly or oils.  Avoid alcohol and illegal drugs.  Get vaccinated for HPV and hepatitis. If you have not received these vaccines in the past, talk to your caregiver about whether one or both might be right for you.  Avoid risky sex practices that can break the skin. The only way to avoid getting an STD is to avoid all sexual activity.Latex condoms and dental dams (for oral sex) will help lessen the risk of getting an STD, but will not completely eliminate the risk. SEEK MEDICAL CARE IF:   You have a fever.  You have any new or worsening symptoms. Document Released: 06/30/2002 Document Revised: 07/02/2011 Document Reviewed: 07/07/2010 [facility] Patient Information 2013 [employer].    Domestic Abuse You are being battered or abused if someone close to you hits, pushes, or physically hurts you in any way. You also are being abused if you are forced into activities. You are being sexually abused if you are forced to have sexual contact of any kind. You are being emotionally abused if you are made to feel worthless or if you are constantly threatened. It is important to remember that help is available. No one has the right to abuse you. PREVENTION OF FURTHER  ABUSE  Learn the warning signs of danger. This varies with situations but may include: the use of alcohol, threats, isolation from friends and family, or forced sexual contact. Leave if you feel that violence is going to occur.  If you are attacked or beaten, report it to the police so the abuse is documented. You do not have to press charges. The police can protect you while you or the attackers are leaving. Get the officer's name and badge number and a copy of the report.  Find someone you can trust and tell them what is happening to you: your caregiver, a nurse, clergy member, close friend or family member. Feeling ashamed is natural, but remember that you have done nothing wrong. No one deserves abuse. Document Released:  04/06/2000 Document Revised: 07/02/2011 Document Reviewed: 06/15/2010 ExitCare Patient Information 2013 [employer].    How Much is Too Much Alcohol? Drinking too much alcohol can cause injury, accidents, and health problems. These types of problems can include:   Car crashes.  Falls.  Family fighting (domestic violence).  Drowning.  Fights.  Injuries.  Burns.  Damage to certain organs.  Having a baby with birth defects. ONE DRINK CAN BE TOO MUCH WHEN YOU ARE:  Working.  Pregnant or breastfeeding.  Taking medicines. Ask your doctor.  Driving or planning to drive. If you or someone you know has a drinking problem, get help from a doctor.  Document Released: 02/03/2009 Document Revised: 07/02/2011 Document Reviewed: 02/03/2009 ExitCare Patient Information 2013 [employer].   Smoking Hazards Smoking cigarettes is extremely bad for your health. Tobacco smoke has over 200 known poisons in it. There are over 60 chemicals in tobacco smoke that cause cancer. Some of the chemicals found in cigarette smoke include:   Cyanide.  Benzene.  Formaldehyde.  Methanol (wood alcohol).  Acetylene (fuel used in welding torches).  Ammonia. Cigarette smoke also contains the poisonous gases nitrogen oxide and carbon monoxide.  Cigarette smokers have an increased risk of many serious medical problems and Smoking causes approximately:  90% of all lung cancer deaths in men.  80% of all lung cancer deaths in women.  90% of deaths from chronic obstructive lung disease. Compared with nonsmokers, smoking increases the risk of:  Coronary heart disease by 2 to 4 times.  Stroke by 2 to 4 times.  Men developing lung cancer by 23 times.  Women developing lung cancer by 13 times.  Dying from chronic obstructive lung diseases by 12 times.  . Smoking is the most preventable cause of death and disease in our society.  WHY IS SMOKING ADDICTIVE?  Nicotine is the chemical  agent in tobacco that is capable of causing addiction or dependence.  When you smoke and inhale, nicotine is absorbed rapidly into the bloodstream through your lungs. Nicotine absorbed through the lungs is capable of creating a powerful addiction. Both inhaled and non-inhaled nicotine may be addictive.  Addiction studies of cigarettes and spit tobacco show that addiction to nicotine occurs mainly during the teen years, when young people begin using tobacco products. WHAT ARE THE BENEFITS OF QUITTING?  There are many health benefits to quitting smoking.   Likelihood of developing cancer and heart disease decreases. Health improvements are seen almost immediately.  Blood pressure, pulse rate, and breathing patterns start returning to normal soon after quitting. QUITTING SMOKING   American Lung Association - 1-800-LUNGUSA  American Cancer Society - 1-800-ACS-2345 Document Released: 05/17/2004 Document Revised: 07/02/2011 Document Reviewed: 01/19/2009 ExitCare Patient Information 2013 ExitCare,   LLC.   Stress Management Stress is a state of physical or mental tension that often results from changes in your life or normal routine. Some common causes of stress are:  Death of a loved one.  Injuries or severe illnesses.  Getting fired or changing jobs.  Moving into a new home. Other causes may be:  Sexual problems.  Business or financial losses.  Taking on a large debt.  Regular conflict with someone at home or at work.  Constant tiredness from lack of sleep. It is not just bad things that are stressful. It may be stressful to:  Win the lottery.  Get married.  Buy a new car. The amount of stress that can be easily tolerated varies from person to person. Changes generally cause stress, regardless of the types of change. Too much stress can affect your health. It may lead to physical or emotional problems. Too little stress (boredom) may also become stressful. SUGGESTIONS TO  REDUCE STRESS:  Talk things over with your family and friends. It often is helpful to share your concerns and worries. If you feel your problem is serious, you may want to get help from a professional counselor.  Consider your problems one at a time instead of lumping them all together. Trying to take care of everything at once may seem impossible. List all the things you need to do and then start with the most important one. Set a goal to accomplish 2 or 3 things each day. If you expect to do too many in a single day you will naturally fail, causing you to feel even more stressed.  Do not use alcohol or drugs to relieve stress. Although you may feel better for a short time, they do not remove the problems that caused the stress. They can also be habit forming.  Exercise regularly - at least 3 times per week. Physical exercise can help to relieve that "uptight" feeling and will relax you.  The shortest distance between despair and hope is often a good night's sleep.  Go to bed and get up on time allowing yourself time for appointments without being rushed.  Take a short "time-out" period from any stressful situation that occurs during the day. Close your eyes and take some deep breaths. Starting with the muscles in your face, tense them, hold it for a few seconds, then relax. Repeat this with the muscles in your neck, shoulders, hand, stomach, back and legs.  Take good care of yourself. Eat a balanced diet and get plenty of rest.  Schedule time for having fun. Take a break from your daily routine to relax. HOME CARE INSTRUCTIONS   Call if you feel overwhelmed by your problems and feel you can no longer manage them on your own.  Return immediately if you feel like hurting yourself or someone else. Document Released: 10/03/2000 Document Revised: 07/02/2011 Document Reviewed: 05/26/2007 ExitCare Patient Information 2013 ExitCare[employer].   

## 2014-07-01 NOTE — Progress Notes (Signed)
29 y.o. G0P0000 Single  Caucasian Fe here for annual exam. Periods normal , only one occasion of BTB with extended cycle OCP. Happy with choice. No health issues in past year. Sees urgent care if needed. No STD screening needed. No health issues today.  Patient's last menstrual period was 06/24/2014.          Sexually active: Yes.    The current method of family planning is OCP (estrogen/progesterone).    Exercising: Yes.    spin & zumba Smoker:  no  Health Maintenance: Pap:  06-15-11 neg MMG:  none Colonoscopy:  none BMD:   none TDaP:  2010 Labs: none Self breast exam: done monthly   reports that she has never smoked. She has never used smokeless tobacco. She reports that she drinks about 0.6 - 1.2 oz of alcohol per week. She reports that she does not use illicit drugs.  Past Medical History  Diagnosis Date  . Abnormal pap 04/2006  . 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 Colposcopy  2008    no biopsy done  . Wisdom tooth extraction      Current Outpatient Prescriptions  Medication Sig Dispense Refill  . CAMRESE 0.15-0.03 &0.01 MG tablet TAKE 1 TABLET BY MOUTH DAILY. 1 Package 3  . Multiple Vitamins-Minerals (MULTIVITAMIN PO) Take by mouth daily.     No current facility-administered medications for this visit.    Family History  Problem Relation Age of Onset  . Cancer Paternal Grandmother     lymph node  . Gout Father     ROS:  Pertinent items are noted in HPI.  Otherwise, a comprehensive ROS was negative.  Exam:   BP 110/74 mmHg  Pulse 68  Resp 16  Ht 5' 3.5" (1.613 m)  Wt 139 lb (63.05 kg)  BMI 24.23 kg/m2  LMP 06/24/2014 Height: 5' 3.5" (161.3 cm) Ht Readings from Last 3 Encounters:  07/01/14 5' 3.5" (1.613 m)  04/07/14 5' 4.5" (1.638 m)  06/29/13 5' 3.75" (1.619 m)    General appearance: alert, cooperative and appears stated age Head: Normocephalic,  without obvious abnormality, atraumatic Neck: no adenopathy, supple, symmetrical, trachea midline and thyroid normal to inspection and palpation Lungs: clear to auscultation bilaterally Breasts: normal appearance, no masses or tenderness, No nipple retraction or dimpling, No nipple discharge or bleeding, No axillary or supraclavicular adenopathy Heart: regular rate and rhythm Abdomen: soft, non-tender; no masses,  no organomegaly Extremities: extremities normal, atraumatic, no cyanosis or edema Skin: Skin color, texture, turgor normal. No rashes or lesions Lymph nodes: Cervical, supraclavicular, and axillary nodes normal. No abnormal inguinal nodes palpated Neurologic: Grossly normal   Pelvic: External genitalia:  no lesions              Urethra:  normal appearing urethra with no masses, tenderness or lesions              Bartholin's and Skene's: normal                 Vagina: normal appearing vagina with normal color and discharge, no lesions              Cervix: normal non tender, no lesions, bleeding with pap only              Pap taken: Yes.   Bimanual Exam:  Uterus:  normal size, contour, position, consistency, mobility, non-tender  Adnexa: normal adnexa and no mass, fullness, tenderness               Rectovaginal: Confirms               Anus:  normal sphincter tone, no lesions  Chaperone present: Yes  A:  Well Woman with normal exam  Contraception OCP desired   P:   Reviewed health and wellness pertinent to exam  Rx Camrese see order  Pap smear taken today with HPVHR   counseled on breast self exam, STD prevention, HIV risk factors and prevention, use and side effects of OCP's, adequate intake of calcium and vitamin D, diet and exercise  return annually or prn  An After Visit Summary was printed and given to the patient.

## 2014-07-02 NOTE — Progress Notes (Signed)
Reviewed personally.  M. Suzanne Mohamud Mrozek, MD.  

## 2014-07-06 LAB — IPS PAP TEST WITH HPV

## 2014-08-04 ENCOUNTER — Telehealth: Payer: Self-pay | Admitting: Certified Nurse Midwife

## 2014-08-04 NOTE — Telephone Encounter (Signed)
Left message regarding upcoming appointment has been canceled and needs to be rescheduled. °

## 2015-03-01 ENCOUNTER — Telehealth: Payer: Self-pay | Admitting: Emergency Medicine

## 2015-03-01 ENCOUNTER — Encounter: Payer: Self-pay | Admitting: Certified Nurse Midwife

## 2015-03-01 NOTE — Telephone Encounter (Signed)
Chief Complaint  Patient presents with  . Appointment    Patient sent mychart message   . Medication Management   ----- Message -----    From: Marella Bile    Sent: 03/01/2015  2:12 PM EST      To: Melvia Heaps, CNM Subject: Non-Urgent Medical Question  Good morning,  I was recently changed to a different generic for my birth control (Daysee) by the pharmacy. I'm wondering if it's time to reevaluate what I'm taking-- I am on week 10 of this 3 month cycle, and I've been spotting for 2 weeks now (nothing heavy and it's darkly colored discharge). I've had some breakthrough bleeding on the other cycles since I've switched to the extended pill, but not two weeks at a time. Would you be concerned? I very much like the idea of the 3 month cycle but two weeks of spotting has got me a little worried. Thoughts? thanks! [name]

## 2015-03-01 NOTE — Telephone Encounter (Signed)
Call to patient. She states her extended cycle tablets were changed to a generic pill over the summer and has been having break through bleeding. She states two weeks ago, she traveled out of the country to [place] and did not miss any pills but could have been late due to time change. Denies pain or heavy vaginal bleeding. Spotting only. Sexually active. She thinks she may need to change pill types.  Advised patient to take OTC home pregnancy test and to return call if positive or if develops any heavy bleeding or dizziness, weakness, lightheaded. Patient verbalized understanding of instructions.  Scheduled office visit with Melvia Heaps CNM for 03/03/15 at 0800.  Routing to provider for final review. Patient agreeable to disposition. Will close encounter.

## 2015-03-01 NOTE — Telephone Encounter (Signed)
Responded to patient via mychart.  Requested she call office. Telephone encounter created.  Will close mychart encounter.

## 2015-03-01 NOTE — Telephone Encounter (Signed)
Mychart message sent to patient to request she call office to schedule office visit with Melvia Heaps CNM.

## 2015-03-03 ENCOUNTER — Ambulatory Visit (INDEPENDENT_AMBULATORY_CARE_PROVIDER_SITE_OTHER): Payer: 59 | Admitting: Certified Nurse Midwife

## 2015-03-03 ENCOUNTER — Encounter: Payer: Self-pay | Admitting: Certified Nurse Midwife

## 2015-03-03 VITALS — BP 108/72 | HR 70 | Resp 16 | Ht 63.5 in | Wt 143.0 lb

## 2015-03-03 DIAGNOSIS — N926 Irregular menstruation, unspecified: Secondary | ICD-10-CM | POA: Diagnosis not present

## 2015-03-03 DIAGNOSIS — Z3041 Encounter for surveillance of contraceptive pills: Secondary | ICD-10-CM | POA: Diagnosis not present

## 2015-03-03 LAB — POCT URINE PREGNANCY: Preg Test, Ur: NEGATIVE

## 2015-03-03 NOTE — Progress Notes (Signed)
29 y.o. Single Caucasian female G0P0000 with LMP 12/21/14 here complaining of  break through bleeding with contraceptive use of Daysee( different generic).   Using contraception method as prescribed.  Denies inconsistent use.  No new medications. Describes break through bleeding occurring on 10 week of continuous use cycle pack, has had spotting for the past 2 weeks. Patient has just returned from [place]. Patient also was switched to new generic. Spotting is not heavy light requires panti liner daily. . Patient is not experiencing cramping with the spotting. Home UPT negative.  Vital Signs:  BP  108/72         Pulse 70  Weight:  143 UPT-neg  O: Healthy WN WD female   Alert oriented x3 Skin:warm and dry Thyroid:normal to inspection and palpation Abdomen: non tender, no masses Pelvic exam:External genital: normal female, no lesions BUS: negative Bladder, urethral meatus non tender Vagina: normal, appearance and discharge, slight brown blood noted in vagina Cervix: non tender, no lesions scant blood noted Uterus: normal, non tender, no mases Adnexal: non tender, no masses, normal    A:Normal Pelvic exam 2-BTB with contraceptive use of new generic Daysee(seasonique). Patient had previous problem with another generic.     P:Reviewed findings of normal pelvic exam 2-Patient to request Camrese previous generic, which worked well. Will stop current pack have menses and start new pack of Camrese as instructed. Will continue to keep menses calendar and advise if any changes or DUB does not resolve.   Rv prn/aex 3/17

## 2015-03-03 NOTE — Patient Instructions (Signed)
Oral Contraception Use Oral contraceptive pills (OCPs) are medicines taken to prevent pregnancy. OCPs work by preventing the ovaries from releasing eggs. The hormones in OCPs also cause the cervical mucus to thicken, preventing the sperm from entering the uterus. The hormones also cause the uterine lining to become thin, not allowing a fertilized egg to attach to the inside of the uterus. OCPs are highly effective when taken exactly as prescribed. However, OCPs do not prevent sexually transmitted diseases (STDs). Safe sex practices, such as using condoms along with an OCP, can help prevent STDs. Before taking OCPs, you may have a physical exam and Pap test. Your health care provider may also order blood tests if necessary. Your health care provider will make sure you are a good candidate for oral contraception. Discuss with your health care provider the possible side effects of the OCP you may be prescribed. When starting an OCP, it can take 2 to 3 months for the body to adjust to the changes in hormone levels in your body.  HOW TO TAKE ORAL CONTRACEPTIVE PILLS Your health care provider may advise you on how to start taking the first cycle of OCPs. Otherwise, you can:   Start on day 1 of your menstrual period. You will not need any backup contraceptive protection with this start time.   Start on the first Sunday after your menstrual period or the day you get your prescription. In these cases, you will need to use backup contraceptive protection for the first week.   Start the pill at any time of your cycle. If you take the pill within 5 days of the start of your period, you are protected against pregnancy right away. In this case, you will not need a backup form of birth control. If you start at any other time of your menstrual cycle, you will need to use another form of birth control for 7 days. If your OCP is the type called a minipill, it will protect you from pregnancy after taking it for 2 days (48  hours). After you have started taking OCPs:   If you forget to take 1 pill, take it as soon as you remember. Take the next pill at the regular time.   If you miss 2 or more pills, call your health care provider because different pills have different instructions for missed doses. Use backup birth control until your next menstrual period starts.   If you use a 28-day pack that contains inactive pills and you miss 1 of the last 7 pills (pills with no hormones), it will not matter. Throw away the rest of the non-hormone pills and start a new pill pack.  No matter which day you start the OCP, you will always start a new pack on that same day of the week. Have an extra pack of OCPs and a backup contraceptive method available in case you miss some pills or lose your OCP pack.  HOME CARE INSTRUCTIONS   Do not smoke.   Always use a condom to protect against STDs. OCPs do not protect against STDs.   Use a calendar to mark your menstrual period days.   Read the information and directions that came with your OCP. Talk to your health care provider if you have questions.  SEEK MEDICAL CARE IF:   You develop nausea and vomiting.   You have abnormal vaginal discharge or bleeding.   You develop a rash.   You miss your menstrual period.   You are losing   your hair.   You need treatment for mood swings or depression.   You get dizzy when taking the OCP.   You develop acne from taking the OCP.   You become pregnant.  SEEK IMMEDIATE MEDICAL CARE IF:   You develop chest pain.   You develop shortness of breath.   You have an uncontrolled or severe headache.   You develop numbness or slurred speech.   You develop visual problems.   You develop pain, redness, and swelling in the legs.    This information is not intended to replace advice given to you by your health care provider. Make sure you discuss any questions you have with your health care provider.   Document  Released: 03/29/2011 Document Revised: 04/30/2014 Document Reviewed: 09/28/2012 Elsevier Interactive Patient Education 2016 Else[employer] Inc[employer]  

## 2015-03-03 NOTE — Progress Notes (Signed)
Reviewed personally.  M. Suzanne Turner Baillie, MD.  

## 2015-05-03 ENCOUNTER — Telehealth: Payer: Self-pay | Admitting: Certified Nurse Midwife

## 2015-05-03 NOTE — Telephone Encounter (Signed)
Left message regarding upcoming appointment has been canceled and needs to be rescheduled. °

## 2015-05-31 MED FILL — CAMRESE 0.15-0.03-0.01 MG T: 0.15-0.03 & | 90 days supply | Qty: 91 | Fill #3

## 2015-07-04 ENCOUNTER — Ambulatory Visit: Payer: 59 | Admitting: Certified Nurse Midwife

## 2015-07-08 ENCOUNTER — Ambulatory Visit: Payer: 59 | Admitting: Certified Nurse Midwife

## 2015-08-10 ENCOUNTER — Ambulatory Visit (INDEPENDENT_AMBULATORY_CARE_PROVIDER_SITE_OTHER): Payer: 59 | Admitting: Certified Nurse Midwife

## 2015-08-10 ENCOUNTER — Encounter: Payer: Self-pay | Admitting: Certified Nurse Midwife

## 2015-08-10 VITALS — BP 110/80 | HR 68 | Resp 16 | Ht 63.75 in | Wt 145.0 lb

## 2015-08-10 DIAGNOSIS — Z3041 Encounter for surveillance of contraceptive pills: Secondary | ICD-10-CM | POA: Diagnosis not present

## 2015-08-10 DIAGNOSIS — Z01419 Encounter for gynecological examination (general) (routine) without abnormal findings: Secondary | ICD-10-CM | POA: Diagnosis not present

## 2015-08-10 MED ORDER — NORETHIN ACE-ETH ESTRAD-FE 1-20 MG-MCG(24) PO TABS: 1.0000 | ORAL_TABLET | Freq: Every day | ORAL | Status: DC

## 2015-08-10 NOTE — Progress Notes (Signed)
30 y.o. G0P0000 Single  Caucasian Fe here for annual exam. Periods with extended cycle OCP has continued to have BTB and would like to change to another OCP. Not interested in other type of contraception at this point. Busy with moving in new house and work. Sees PCP prn, no issues in past year. No partner change or STD screening desired. No other health issues today.  Patient's last menstrual period was 07/30/2015.          Sexually active: Yes.    The current method of family planning is OCP (estrogen/progesterone).    Exercising: Yes.    barre,spin,zumba Smoker:  no  Health Maintenance: Pap:  07-02-14 neg HPV HR neg MMG:  none Colonoscopy:  none BMD:   none TDaP:  2010 Shingles: no Pneumonia: no Hep C and HIV: HIV neg 2014 Labs: none Self breast exam: done monthly   reports that she has never smoked. She has never used smokeless tobacco. She reports that she drinks about 1.2 - 1.8 oz of alcohol per week. She reports that she does not use illicit drugs.  Past Medical History  Diagnosis Date  . Abnormal pap 04/2006  . 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 Colposcopy  2008    no biopsy done  . Wisdom tooth extraction      Current Outpatient Prescriptions  Medication Sig Dispense Refill  . Levonorgestrel-Ethinyl Estradiol (DAYSEE) 0.15-0.03 &0.01 MG tablet Take 1 tablet by mouth daily.    . Multiple Vitamins-Minerals (MULTIVITAMIN PO) Take by mouth daily.     No current facility-administered medications for this visit.    Family History  Problem Relation Age of Onset  . Cancer Paternal Grandmother     lymph node  . Gout Father     ROS:  Pertinent items are noted in HPI.  Otherwise, a comprehensive ROS was negative.  Exam:   BP 110/80 mmHg  Pulse 68  Resp 16  Ht 5' 3.75" (1.619 m)  Wt 145 lb (65.772 kg)  BMI 25.09 kg/m2  LMP 07/30/2015 Height: 5' 3.75" (161.9  cm) Ht Readings from Last 3 Encounters:  08/10/15 5' 3.75" (1.619 m)  03/03/15 5' 3.5" (1.613 m)  07/01/14 5' 3.5" (1.613 m)    General appearance: alert, cooperative and appears stated age Head: Normocephalic, without obvious abnormality, atraumatic Neck: no adenopathy, supple, symmetrical, trachea midline and thyroid normal to inspection and palpation Lungs: clear to auscultation bilaterally Breasts: normal appearance, no masses or tenderness, No nipple retraction or dimpling, No nipple discharge or bleeding, No axillary or supraclavicular adenopathy , small brown mole noted, round no irregular appearance, near axilla area on left, no change in past year per patient, had dermatology check, no issues. Heart: regular rate and rhythm Abdomen: soft, non-tender; no masses,  no organomegaly Extremities: extremities normal, atraumatic, no cyanosis or edema Skin: Skin color, texture, turgor normal. No rashes or lesions Lymph nodes: Cervical, supraclavicular, and axillary nodes normal. No abnormal inguinal nodes palpated Neurologic: Grossly normal   Pelvic: External genitalia:  no lesions              Urethra:  normal appearing urethra with no masses, tenderness or lesions              Bartholin's and Skene's: normal                 Vagina: normal appearing vagina with  normal color and discharge, no lesions              Cervix: normal,non tender, no lesions              Pap taken: No. Bimanual Exam:  Uterus:  normal size, contour, position, consistency, mobility, non-tender and anteverted              Adnexa: normal adnexa and no mass, fullness, tenderness               Rectovaginal: Confirms               Anus:  normal appearance  Chaperone present: yes  A:  Well Woman with normal exam  Contraception OCP desired, not continuous use.  P:   Reviewed health and wellness pertinent to exam  Rx Loestrin 24 Fe with instructions see order. Patient will advise if continues with BTB.  Pap  smear as above not taken   counseled on breast self exam, STD prevention, HIV risk factors and prevention, use and side effects of OCP's, adequate intake of calcium and vitamin D, diet and exercise  return annually or prn  An After Visit Summary was printed and given to the patient.

## 2015-08-10 NOTE — Patient Instructions (Signed)
EXERCISE AND DIET:  We recommended that you start or continue a regular exercise program for good health. Regular exercise means any activity that makes your heart beat faster and makes you sweat.  We recommend exercising at least 30 minutes per day at least 3 days a week, preferably 4 or 5.  We also recommend a diet low in fat and sugar.  Inactivity, poor dietary choices and obesity can cause diabetes, heart attack, stroke, and kidney damage, among others.    ALCOHOL AND SMOKING:  Women should limit their alcohol intake to no more than 7 drinks/beers/glasses of wine (combined, not each!) per week. Moderation of alcohol intake to this level decreases your risk of breast cancer and liver damage. And of course, no recreational drugs are part of a healthy lifestyle.  And absolutely no smoking or even second hand smoke. Most people know smoking can cause heart and lung diseases, but did you know it also contributes to weakening of your bones? Aging of your skin?  Yellowing of your teeth and nails?  CALCIUM AND VITAMIN D:  Adequate intake of calcium and Vitamin D are recommended.  The recommendations for exact amounts of these supplements seem to change often, but generally speaking 600 mg of calcium (either carbonate or citrate) and 800 units of Vitamin D per day seems prudent. Certain women may benefit from higher intake of Vitamin D.  If you are among these women, your doctor will have told you during your visit.    PAP SMEARS:  Pap smears, to check for cervical cancer or precancers,  have traditionally been done yearly, although recent scientific advances have shown that most women can have pap smears less often.  However, every woman still should have a physical exam from her gynecologist every year. It will include a breast check, inspection of the vulva and vagina to check for abnormal growths or skin changes, a visual exam of the cervix, and then an exam to evaluate the size and shape of the uterus and  ovaries.  And after 32 years of age, a rectal exam is indicated to check for rectal cancers. We will also provide age appropriate advice regarding health maintenance, like when you should have certain vaccines, screening for sexually transmitted diseases, bone density testing, colonoscopy, mammograms, etc.   MAMMOGRAMS:  All women over 40 years old should have a yearly mammogram. Many facilities now offer a "3D" mammogram, which may cost around $50 extra out of pocket. If possible,  we recommend you accept the option to have the 3D mammogram performed.  It both reduces the number of women who will be called back for extra views which then turn out to be normal, and it is better than the routine mammogram at detecting truly abnormal areas.    COLONOSCOPY:  Colonoscopy to screen for colon cancer is recommended for all women at age 50.  We know, you hate the idea of the prep.  We agree, BUT, having colon cancer and not knowing it is worse!!  Colon cancer so often starts as a polyp that can be seen and removed at colonscopy, which can quite literally save your life!  And if your first colonoscopy is normal and you have no family history of colon cancer, most women don't have to have it again for 10 years.  Once every ten years, you can do something that may end up saving your life, right?  We will be happy to help you get it scheduled when you are ready.    Be sure to check your insurance coverage so you understand how much it will cost.  It may be covered as a preventative service at no cost, but you should check your particular policy.     Oral Contraception Use Oral contraceptive pills (OCPs) are medicines taken to prevent pregnancy. OCPs work by preventing the ovaries from releasing eggs. The hormones in OCPs also cause the cervical mucus to thicken, preventing the sperm from entering the uterus. The hormones also cause the uterine lining to become thin, not allowing a fertilized egg to attach to the inside of  the uterus. OCPs are highly effective when taken exactly as prescribed. However, OCPs do not prevent sexually transmitted diseases (STDs). Safe sex practices, such as using condoms along with an OCP, can help prevent STDs. Before taking OCPs, you may have a physical exam and Pap test. Your health care provider may also order blood tests if necessary. Your health care provider will make sure you are a good candidate for oral contraception. Discuss with your health care provider the possible side effects of the OCP you may be prescribed. When starting an OCP, it can take 2 to 3 months for the body to adjust to the changes in hormone levels in your body.  HOW TO TAKE ORAL CONTRACEPTIVE PILLS Your health care provider may advise you on how to start taking the first cycle of OCPs. Otherwise, you can:   Start on day 1 of your menstrual period. You will not need any backup contraceptive protection with this start time.   Start on the first Sunday after your menstrual period or the day you get your prescription. In these cases, you will need to use backup contraceptive protection for the first week.   Start the pill at any time of your cycle. If you take the pill within 5 days of the start of your period, you are protected against pregnancy right away. In this case, you will not need a backup form of birth control. If you start at any other time of your menstrual cycle, you will need to use another form of birth control for 7 days. If your OCP is the type called a minipill, it will protect you from pregnancy after taking it for 2 days (48 hours). After you have started taking OCPs:   If you forget to take 1 pill, take it as soon as you remember. Take the next pill at the regular time.   If you miss 2 or more pills, call your health care provider because different pills have different instructions for missed doses. Use backup birth control until your next menstrual period starts.   If you use a 28-day  pack that contains inactive pills and you miss 1 of the last 7 pills (pills with no hormones), it will not matter. Throw away the rest of the non-hormone pills and start a new pill pack.  No matter which day you start the OCP, you will always start a new pack on that same day of the week. Have an extra pack of OCPs and a backup contraceptive method available in case you miss some pills or lose your OCP pack.  HOME CARE INSTRUCTIONS   Do not smoke.   Always use a condom to protect against STDs. OCPs do not protect against STDs.   Use a calendar to mark your menstrual period days.   Read the information and directions that came with your OCP. Talk to your health care provider if you have questions.  SEEK MEDICAL CARE IF:   You develop nausea and vomiting.   You have abnormal vaginal discharge or bleeding.   You develop a rash.   You miss your menstrual period.   You are losing your hair.   You need treatment for mood swings or depression.   You get dizzy when taking the OCP.   You develop acne from taking the OCP.   You become pregnant.  SEEK IMMEDIATE MEDICAL CARE IF:   You develop chest pain.   You develop shortness of breath.   You have an uncontrolled or severe headache.   You develop numbness or slurred speech.   You develop visual problems.   You develop pain, redness, and swelling in the legs.    This information is not intended to replace advice given to you by your health care provider. Make sure you discuss any questions you have with your health care provider.   Document Released: 03/29/2011 Document Revised: 04/30/2014 Document Reviewed: 09/28/2012 Elsevier Interactive Patient Education [employer].  Have a good year, let me know if spotting continues

## 2015-08-11 NOTE — Progress Notes (Signed)
Encounter reviewed Jill Jertson, MD   

## 2015-08-12 ENCOUNTER — Telehealth: Payer: Self-pay | Admitting: Emergency Medicine

## 2015-08-12 ENCOUNTER — Encounter: Payer: Self-pay | Admitting: Certified Nurse Midwife

## 2015-08-12 NOTE — Telephone Encounter (Signed)
Patient sent mychart message and response sent to back to patient via mychart.  Please advise if any further instructions.   To: Lindsey Sellers    From: Michele Mcalpine, RN    Created: 08/12/2015 5:39 PM     -------------------------------------------------------------------------------- [name],  My name is [name] and I am a triage nurse with Melvia Heaps CNM. I am helping her to answer mychart messages. At this point in your cycle, you should not stop your pills if you are sexually active because you will not have contraception coverage. It would be best for cycle control and contraception to finish your active pills and then wait 4 days then start your new pack. I hope this helps! Please call us if you have any further questions.   Sincerely,  Karen Chafe, BSN RN-BC Triage Nurse [place] [address], [place], [place] [address] [facility]:  [phone]; Fax:  [phone]   ----- Message -----    From: Lindsey Sellers    Sent: 08/12/2015  9:17 AM EDT      To: Melvia Heaps, CNM Subject: Visit Follow-Up Question  Good morning! I know we discussed on Wednesday that I had two active weeks left on my birth control pack and then I'd fill my new prescription for the monthly pill. I am spotting again however, and I'm wondering if I should just go ahead and fill the new pack and allow my body to have a period this weekend/next week. Thoughts?  thanks! [name]

## 2015-08-15 NOTE — Telephone Encounter (Signed)
Should do BUM during new start again

## 2015-08-16 NOTE — Telephone Encounter (Signed)
Call to patient, notified of Debbi's instruction for BUM with new start. Patient agreeable. Encounter closed.

## 2015-08-31 MED FILL — LARIN 24 FE 1 MG-20 MCG TAB: 1-20 | 84 days supply | Qty: 84 | Fill #0 | Status: TO

## 2015-11-23 MED FILL — LARIN 24 FE 1 MG-20 MCG TAB: 1-20 | 84 days supply | Qty: 84 | Fill #0

## 2016-02-13 MED FILL — LARIN 24 FE 1 MG-20 MCG TAB: 1-20 | 84 days supply | Qty: 84 | Fill #1

## 2016-04-30 DIAGNOSIS — H5213 Myopia, bilateral: Secondary | ICD-10-CM | POA: Diagnosis not present

## 2016-05-08 MED FILL — BLISOVI 24 FE TABLET: 1-20 | 84 days supply | Qty: 84 | Fill #2

## 2016-07-31 MED FILL — BLISOVI 24 FE TABLET: 1-20 | 84 days supply | Qty: 84 | Fill #3

## 2016-08-10 ENCOUNTER — Ambulatory Visit (INDEPENDENT_AMBULATORY_CARE_PROVIDER_SITE_OTHER): Payer: 59 | Admitting: Certified Nurse Midwife

## 2016-08-10 ENCOUNTER — Encounter: Payer: Self-pay | Admitting: Certified Nurse Midwife

## 2016-08-10 VITALS — BP 102/64 | HR 60 | Resp 16 | Ht 63.75 in | Wt 146.0 lb

## 2016-08-10 DIAGNOSIS — Z3041 Encounter for surveillance of contraceptive pills: Secondary | ICD-10-CM

## 2016-08-10 DIAGNOSIS — Z01419 Encounter for gynecological examination (general) (routine) without abnormal findings: Secondary | ICD-10-CM | POA: Diagnosis not present

## 2016-08-10 DIAGNOSIS — Z124 Encounter for screening for malignant neoplasm of cervix: Secondary | ICD-10-CM

## 2016-08-10 MED ORDER — NORETHIN ACE-ETH ESTRAD-FE 1-20 MG-MCG(24) PO TABS: 1.0000 | ORAL_TABLET | Freq: Every day | ORAL | 4 refills | Status: DC

## 2016-08-10 NOTE — Progress Notes (Signed)
31 y.o. G0P0000 Single  Caucasian Fe here for annual exam. Periods normal, no issues. Contraception working well, would like same generic each time if possible. No STD concerns. Sees PCP for aex, has not been seen this year yet.has been working on diet and weight control. No other health issues today.  Recently engaged and getting married 10/18. Moved in new house!      Patient's last menstrual period was 08/02/2016 (exact date).          Sexually active: Yes.    The current method of family planning is OCP (estrogen/progesterone).    Exercising: Yes.    pure barre,spin,zumba Smoker:  no  Health Maintenance: Pap:  07-02-14 neg HPV HR neg History of Abnormal Pap: yes colpo 2008 no biopsy done MMG:  none Self Breast exams: yes Colonoscopy:  none BMD:   none TDaP:  2010 Shingles: no Pneumonia: no Hep C and HIV: HIV neg 2014 Labs: none   reports that she has never smoked. She has never used smokeless tobacco. She reports that she drinks about 1.8 oz of alcohol per week . She reports that she does not use drugs.  Past Medical History:  Diagnosis Date  . Abnormal pap 04/2006  . 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 COLPOSCOPY  2008   no biopsy done  . WISDOM TOOTH EXTRACTION      Current Outpatient Prescriptions  Medication Sig Dispense Refill  . Multiple Vitamins-Minerals (MULTIVITAMIN PO) Take by mouth daily.    . Norethindrone Acetate-Ethinyl Estrad-FE (LOESTRIN 24 FE) 1-20 MG-MCG(24) tablet Take 1 tablet by mouth daily. 3 Package 4   No current facility-administered medications for this visit.     Family History  Problem Relation Age of Onset  . Cancer Paternal Grandmother     lymph node  . Gout Father     ROS:  Pertinent items are noted in HPI.  Otherwise, a comprehensive ROS was negative.  Exam:   BP 102/64   Pulse 60   Resp 16   Ht 5' 3.75" (1.619 m)   Wt 146 lb  (66.2 kg)   LMP 08/02/2016 (Exact Date)   BMI 25.26 kg/m  Height: 5' 3.75" (161.9 cm) Ht Readings from Last 3 Encounters:  08/10/16 5' 3.75" (1.619 m)  08/10/15 5' 3.75" (1.619 m)  03/03/15 5' 3.5" (1.613 m)    General appearance: alert, cooperative and appears stated age Head: Normocephalic, without obvious abnormality, atraumatic Neck: no adenopathy, supple, symmetrical, trachea midline and thyroid normal to inspection and palpation Lungs: clear to auscultation bilaterally Breasts: normal appearance, no masses or tenderness, No nipple retraction or dimpling, No nipple discharge or bleeding, No axillary or supraclavicular adenopathy Heart: regular rate and rhythm Abdomen: soft, non-tender; no masses,  no organomegaly Extremities: extremities normal, atraumatic, no cyanosis or edema Skin: Skin color, texture, turgor normal. No rashes or lesions Lymph nodes: Cervical, supraclavicular, and axillary nodes normal. No abnormal inguinal nodes palpated Neurologic: Grossly normal   Pelvic: External genitalia:  no lesions              Urethra:  normal appearing urethra with no masses, tenderness or lesions              Bartholin's and Skene's: normal                 Vagina: normal appearing vagina with normal color and discharge, no lesions  Cervix: no bleeding following Pap, no cervical motion tenderness and no lesions              Pap taken: Yes.   Bimanual Exam:  Uterus:  normal size, contour, position, consistency, mobility, non-tender              Adnexa: normal adnexa and no mass, fullness, tenderness               Rectovaginal: Confirms               Anus:  normal appearance  Chaperone present: yes  A:  Well Woman with normal exam  Contraception OCP desired    P:   Reviewed health and wellness pertinent to exam  Discussed requesting same generic each time to help with slight change in cycle.   Rx Loestrin 24 Fe see order with request and instructions  Pap smear:  yes  counseled on breast self exam, use and side effects of OCP's, adequate intake of calcium and vitamin D, diet and exercise  return annually or prn  An After Visit Summary was printed and given to the patient.

## 2016-08-10 NOTE — Patient Instructions (Signed)
General topics  Next pap or exam is  due in 1 year Take a Women's multivitamin Take 1200 mg. of calcium daily - prefer dietary If any concerns in interim to call back  Breast Self-Awareness Practicing breast self-awareness may pick up problems early, prevent significant medical complications, and possibly save your life. By practicing breast self-awareness, you can become familiar with how your breasts look and feel and if your breasts are changing. This allows you to notice changes early. It can also offer you some reassurance that your breast health is good. One way to learn what is normal for your breasts and whether your breasts are changing is to do a breast self-exam. If you find a lump or something that was not present in the past, it is best to contact your caregiver right away. Other findings that should be evaluated by your caregiver include nipple discharge, especially if it is bloody; skin changes or reddening; areas where the skin seems to be pulled in (retracted); or new lumps and bumps. Breast pain is seldom associated with cancer (malignancy), but should also be evaluated by a caregiver. BREAST SELF-EXAM The best time to examine your breasts is 5 7 days after your menstrual period is over.  ExitCare Patient Information 2013 [employer].   Exercise to Stay Healthy Exercise helps you become and stay healthy. EXERCISE IDEAS AND TIPS Choose exercises that:  You enjoy.  Fit into your day. You do not need to exercise really hard to be healthy. You can do exercises at a slow or medium level and stay healthy. You can:  Stretch before and after working out.  Try yoga, Pilates, or tai chi.  Lift weights.  Walk fast, swim, jog, run, climb stairs, bicycle, dance, or rollerskate.  Take aerobic classes. Exercises that burn about 150 calories:  Running 1  miles in 15 minutes.  Playing volleyball for 45 to 60 minutes.  Washing and waxing a car for 45 to 60  minutes.  Playing touch football for 45 minutes.  Walking 1  miles in 35 minutes.  Pushing a stroller 1  miles in 30 minutes.  Playing basketball for 30 minutes.  Raking leaves for 30 minutes.  Bicycling 5 miles in 30 minutes.  Walking 2 miles in 30 minutes.  Dancing for 30 minutes.  Shoveling snow for 15 minutes.  Swimming laps for 20 minutes.  Walking up stairs for 15 minutes.  Bicycling 4 miles in 15 minutes.  Gardening for 30 to 45 minutes.  Jumping rope for 15 minutes.  Washing windows or floors for 45 to 60 minutes. Document Released: 05/12/2010 Document Revised: 07/02/2011 Document Reviewed: 05/12/2010 ExitCare Patient Information 2013 [employer].   Other topics ( that may be useful information):    Sexually Transmitted Disease Sexually transmitted disease (STD) refers to any infection that is passed from person to person during sexual activity. This may happen by way of saliva, semen, blood, vaginal mucus, or urine. Common STDs include:  Gonorrhea.  Chlamydia.  Syphilis.  HIV/AIDS.  Genital herpes.  Hepatitis B and C.  Trichomonas.  Human papillomavirus (HPV).  Pubic lice. CAUSES  An STD may be spread by bacteria, virus, or parasite. A person can get an STD by:  Sexual intercourse with an infected person.  Sharing sex toys with an infected person.  Sharing needles with an infected person.  Having intimate contact with the genitals, mouth, or rectal areas of an infected person. SYMPTOMS  Some people may not have any symptoms, but   they can still pass the infection to others. Different STDs have different symptoms. Symptoms include:  Painful or bloody urination.  Pain in the pelvis, abdomen, vagina, anus, throat, or eyes.  Skin rash, itching, irritation, growths, or sores (lesions). These usually occur in the genital or anal area.  Abnormal vaginal discharge.  Penile discharge in men.  Soft, flesh-colored skin growths in the  genital or anal area.  Fever.  Pain or bleeding during sexual intercourse.  Swollen glands in the groin area.  Yellow skin and eyes (jaundice). This is seen with hepatitis. DIAGNOSIS  To make a diagnosis, your caregiver may:  Take a medical history.  Perform a physical exam.  Take a specimen (culture) to be examined.  Examine a sample of discharge under a microscope.  Perform blood test TREATMENT   Chlamydia, gonorrhea, trichomonas, and syphilis can be cured with antibiotic medicine.  Genital herpes, hepatitis, and HIV can be treated, but not cured, with prescribed medicines. The medicines will lessen the symptoms.  Genital warts from HPV can be treated with medicine or by freezing, burning (electrocautery), or surgery. Warts may come back.  HPV is a virus and cannot be cured with medicine or surgery.However, abnormal areas may be followed very closely by your caregiver and may be removed from the cervix, vagina, or vulva through office procedures or surgery. If your diagnosis is confirmed, your recent sexual partners need treatment. This is true even if they are symptom-free or have a negative culture or evaluation. They should not have sex until their caregiver says it is okay. HOME CARE INSTRUCTIONS  All sexual partners should be informed, tested, and treated for all STDs.  Take your antibiotics as directed. Finish them even if you start to feel better.  Only take over-the-counter or prescription medicines for pain, discomfort, or fever as directed by your caregiver.  Rest.  Eat a balanced diet and drink enough fluids to keep your urine clear or pale yellow.  Do not have sex until treatment is completed and you have followed up with your caregiver. STDs should be checked after treatment.  Keep all follow-up appointments, Pap tests, and blood tests as directed by your caregiver.  Only use latex condoms and water-soluble lubricants during sexual activity. Do not use  petroleum jelly or oils.  Avoid alcohol and illegal drugs.  Get vaccinated for HPV and hepatitis. If you have not received these vaccines in the past, talk to your caregiver about whether one or both might be right for you.  Avoid risky sex practices that can break the skin. The only way to avoid getting an STD is to avoid all sexual activity.Latex condoms and dental dams (for oral sex) will help lessen the risk of getting an STD, but will not completely eliminate the risk. SEEK MEDICAL CARE IF:   You have a fever.  You have any new or worsening symptoms. Document Released: 06/30/2002 Document Revised: 07/02/2011 Document Reviewed: 07/07/2010 [facility] Patient Information 2013 [place].    Domestic Abuse You are being battered or abused if someone close to you hits, pushes, or physically hurts you in any way. You also are being abused if you are forced into activities. You are being sexually abused if you are forced to have sexual contact of any kind. You are being emotionally abused if you are made to feel worthless or if you are constantly threatened. It is important to remember that help is available. No one has the right to abuse you. PREVENTION OF FURTHER  ABUSE  Learn the warning signs of danger. This varies with situations but may include: the use of alcohol, threats, isolation from friends and family, or forced sexual contact. Leave if you feel that violence is going to occur.  If you are attacked or beaten, report it to the police so the abuse is documented. You do not have to press charges. The police can protect you while you or the attackers are leaving. Get the officer's name and badge number and a copy of the report.  Find someone you can trust and tell them what is happening to you: your caregiver, a nurse, clergy member, close friend or family member. Feeling ashamed is natural, but remember that you have done nothing wrong. No one deserves abuse. Document Released:  04/06/2000 Document Revised: 07/02/2011 Document Reviewed: 06/15/2010 ExitCare Patient Information 2013 [employer]Care[employer].    How Much is Too Much Alcohol? Drinking too much alcohol can cause injury, accidents, and health problems. These types of problems can include:   Car crashes.  Falls.  Family fighting (domestic violence).  Drowning.  Fights.  Injuries.  Burns.  Damage to certain organs.  Having a baby with birth defects. ONE DRINK CAN BE TOO MUCH WHEN YOU ARE:  Working.  Pregnant or breastfeeding.  Taking medicines. Ask your doctor.  Driving or planning to drive. If you or someone you know has a drinking problem, get help from a doctor.  Document Released: 02/03/2009 Document Revised: 07/02/2011 Document Reviewed: 02/03/2009 ExitCare Patient Information 2013 [employer]Care[employer].   Smoking Hazards Smoking cigarettes is extremely bad for your health. Tobacco smoke has over 200 known poisons in it. There are over 60 chemicals in tobacco smoke that cause cancer. Some of the chemicals found in cigarette smoke include:   Cyanide.  Benzene.  Formaldehyde.  Methanol (wood alcohol).  Acetylene (fuel used in welding torches).  Ammonia. Cigarette smoke also contains the poisonous gases nitrogen oxide and carbon monoxide.  Cigarette smokers have an increased risk of many serious medical problems and Smoking causes approximately:  90% of all lung cancer deaths in men.  80% of all lung cancer deaths in women.  90% of deaths from chronic obstructive lung disease. Compared with nonsmokers, smoking increases the risk of:  Coronary heart disease by 2 to 4 times.  Stroke by 2 to 4 times.  Men developing lung cancer by 23 times.  Women developing lung cancer by 13 times.  Dying from chronic obstructive lung diseases by 12 times.  . Smoking is the most preventable cause of death and disease in our society.  WHY IS SMOKING ADDICTIVE?  Nicotine is the chemical  agent in tobacco that is capable of causing addiction or dependence.  When you smoke and inhale, nicotine is absorbed rapidly into the bloodstream through your lungs. Nicotine absorbed through the lungs is capable of creating a powerful addiction. Both inhaled and non-inhaled nicotine may be addictive.  Addiction studies of cigarettes and spit tobacco show that addiction to nicotine occurs mainly during the teen years, when young people begin using tobacco products. WHAT ARE THE BENEFITS OF QUITTING?  There are many health benefits to quitting smoking.   Likelihood of developing cancer and heart disease decreases. Health improvements are seen almost immediately.  Blood pressure, pulse rate, and breathing patterns start returning to normal soon after quitting. QUITTING SMOKING   American Lung Association - 1-800-LUNGUSA  American Cancer Society - 1-800-ACS-2345 Document Released: 05/17/2004 Document Revised: 07/02/2011 Document Reviewed: 01/19/2009 ExitCare Patient Information 2013 ExitCare,   LLC.   Stress Management Stress is a state of physical or mental tension that often results from changes in your life or normal routine. Some common causes of stress are:  Death of a loved one.  Injuries or severe illnesses.  Getting fired or changing jobs.  Moving into a new home. Other causes may be:  Sexual problems.  Business or financial losses.  Taking on a large debt.  Regular conflict with someone at home or at work.  Constant tiredness from lack of sleep. It is not just bad things that are stressful. It may be stressful to:  Win the lottery.  Get married.  Buy a new car. The amount of stress that can be easily tolerated varies from person to person. Changes generally cause stress, regardless of the types of change. Too much stress can affect your health. It may lead to physical or emotional problems. Too little stress (boredom) may also become stressful. SUGGESTIONS TO  REDUCE STRESS:  Talk things over with your family and friends. It often is helpful to share your concerns and worries. If you feel your problem is serious, you may want to get help from a professional counselor.  Consider your problems one at a time instead of lumping them all together. Trying to take care of everything at once may seem impossible. List all the things you need to do and then start with the most important one. Set a goal to accomplish 2 or 3 things each day. If you expect to do too many in a single day you will naturally fail, causing you to feel even more stressed.  Do not use alcohol or drugs to relieve stress. Although you may feel better for a short time, they do not remove the problems that caused the stress. They can also be habit forming.  Exercise regularly - at least 3 times per week. Physical exercise can help to relieve that "uptight" feeling and will relax you.  The shortest distance between despair and hope is often a good night's sleep.  Go to bed and get up on time allowing yourself time for appointments without being rushed.  Take a short "time-out" period from any stressful situation that occurs during the day. Close your eyes and take some deep breaths. Starting with the muscles in your face, tense them, hold it for a few seconds, then relax. Repeat this with the muscles in your neck, shoulders, hand, stomach, back and legs.  Take good care of yourself. Eat a balanced diet and get plenty of rest.  Schedule time for having fun. Take a break from your daily routine to relax. HOME CARE INSTRUCTIONS   Call if you feel overwhelmed by your problems and feel you can no longer manage them on your own.  Return immediately if you feel like hurting yourself or someone else. Document Released: 10/03/2000 Document Revised: 07/02/2011 Document Reviewed: 05/26/2007 ExitCare Patient Information 2013 ExitCare, LLC.   

## 2016-08-11 NOTE — Progress Notes (Signed)
Encounter reviewed Zebulan Hinshaw, MD   

## 2016-08-13 LAB — IPS PAP TEST WITH REFLEX TO HPV

## 2016-09-04 ENCOUNTER — Telehealth: Payer: Self-pay | Admitting: Family

## 2016-09-04 NOTE — Telephone Encounter (Signed)
This is okay with me.  

## 2016-09-04 NOTE — Telephone Encounter (Signed)
Patient is requesting to transfer care because of proximity.   Melissa,  Is this ok with you?  Sam, Is this ok with you?

## 2016-09-04 NOTE — Telephone Encounter (Signed)
Ok with me 

## 2016-09-07 ENCOUNTER — Telehealth: Payer: Self-pay | Admitting: General Practice

## 2016-09-07 NOTE — Telephone Encounter (Signed)
Called patient to establish care with [facility]. Left message on phone # [phone]

## 2016-09-07 NOTE — Telephone Encounter (Signed)
Hailey, please give this patient a call to establish

## 2016-09-07 NOTE — Telephone Encounter (Signed)
Lindsey Sellers, Please give this patient a call to establish. ty

## 2016-09-11 NOTE — Telephone Encounter (Signed)
Patient called back, she has been scheduled for October 10, 2016 at 9am with Wanda Plump to establish care.

## 2016-10-09 NOTE — Progress Notes (Signed)
Lindsey Sellers is a 31 y.o. female here to Lindsey Sellers and Annual Physical exam.  I acted as a Education administrator for [employer], PA-C Lindsey Pickler, Lindsey Sellers  History of Present Illness:   Chief Complaint  Patient presents with  . [place]  . Annual Exam   Patient is here for a physical exam. She lives in the [place] and is transferring to this office from Lindsey Alar, NP at [facility].  Acute Concerns: None  Chronic Issues: None  Health Maintenance: Immunizations -- Up to date Colonoscopy -- N/A Mammogram -- N/A -- no early ultrasounds/mammogram PAP -- 08/10/2016 Normal Diet -- yogurt with granola and fruit, more likely to bring lunch from home than eat out, cooks often for dinner Caffeine intake -- 2 cups of coffee daily, no energy drinks or soda, occasional sweet tea Sleep habits -- light sleeper Exercise -- Pure [employer]re 2x a week, [employer] for spin and zumba Weight -- Weight: 144 lb 4 oz (65.4 kg) -- a little high for her, trying to lose weight for the wedding Mood -- no issues with anger or anxiety Periods -- was on the 8-month birth control, getting periods monthly now with her Loestrin 24 Fe  Depression screen PHQ 2/9 10/10/2016  Decreased Interest 0  Down, Depressed, Hopeless 0  PHQ - 2 Score 0   Other providers/specialists: Lindsey Sellers -- Ob-Gyn Eye doctor and dentist -- up to date  PMHx, SurgHx, SocialHx, Medications, and Allergies were reviewed in the Visit Navigator and updated as appropriate.  Current Medications:   Current Outpatient Prescriptions:  Marland Kitchen  Multiple Vitamins-Minerals (MULTIVITAMIN PO), Take by mouth daily., Disp: , Rfl:  .  Norethindrone Acetate-Ethinyl Estrad-FE (LOESTRIN 24 FE) 1-20 MG-MCG(24) tablet, Take 1 tablet by mouth daily., Disp: 3 Package, Rfl: 4   Review of Systems:   Review of Systems  Constitutional: Negative for chills, fever, malaise/fatigue and weight loss.  HENT: Negative  for hearing loss, sinus pain and sore throat.   Eyes: Negative for blurred vision.  Respiratory: Negative for cough and shortness of breath.   Cardiovascular: Negative for chest pain, palpitations and leg swelling.  Gastrointestinal: Negative for abdominal pain, constipation, diarrhea, heartburn, nausea and vomiting.  Genitourinary: Negative for dysuria, frequency and urgency.  Musculoskeletal: Negative for back pain, myalgias and neck pain.  Skin: Negative for itching and rash.  Neurological: Negative for dizziness, tingling, seizures, loss of consciousness and headaches.  Endo/Heme/Allergies: Negative for polydipsia.  Psychiatric/Behavioral: Negative for depression. The patient is not nervous/anxious.     Vitals:   Vitals:   10/10/16 0855  BP: 138/84  Pulse: 89  Temp: 98.8 F (37.1 C)  TempSrc: Oral  SpO2: 99%  Weight: 144 lb 4 oz (65.4 kg)  Height: 5\' 4"  (1.626 m)     Body mass index is 24.76 kg/m.  Physical Exam:   Physical Exam  Constitutional: She appears well-developed and well-nourished. She is cooperative.  Non-toxic appearance. She does not have a sickly appearance. She does not appear ill. No distress.  HENT:  Head: Normocephalic and atraumatic.  Right Ear: Tympanic membrane, external ear and ear canal normal. Tympanic membrane is not erythematous, not retracted and not bulging.  Left Ear: Tympanic membrane, external ear and ear canal normal. Tympanic membrane is not erythematous, not retracted and not bulging.  Eyes: Conjunctivae, EOM and lids are normal. Pupils are equal, round, and reactive to light.  Neck: Trachea normal and full passive range  of motion without pain.  Cardiovascular: Normal rate, regular rhythm, S1 normal, S2 normal, normal heart sounds and intact distal pulses.   Pulmonary/Chest: Effort normal and breath sounds normal. No tachypnea. No respiratory distress. She has no decreased breath sounds. She has no wheezes. She has no rhonchi. She has no  rales.  Abdominal: Soft. Normal appearance and bowel sounds are normal. There is no tenderness.  Musculoskeletal: Normal range of motion.  Lymphadenopathy:    She has no cervical adenopathy.  Neurological: She is alert. She has normal reflexes. No cranial nerve deficit or sensory deficit. GCS eye subscore is 4. GCS verbal subscore is 5. GCS motor subscore is 6.  Skin: Skin is warm, dry and intact.  Psychiatric: She has a normal mood and affect. Her speech is normal and behavior is normal.  Nursing note and vitals reviewed.   Assessment and Plan:    Nusaybah was seen today for establish care and annual exam.  Diagnoses and all orders for this visit:  Routine general medical examination at a health care facility Today patient counseled on age appropriate routine health concerns for screening and prevention, each reviewed and up to date or declined. Immunizations reviewed and up to date or declined. Labs ordered and reviewed. Risk factors for depression reviewed and negative. Hearing function and visual acuity are intact. ADLs screened and addressed as needed. Functional ability and level of safety reviewed and appropriate. Education, counseling and referrals performed based on assessed risks today. Patient provided with a copy of personalized plan for preventive services. -     CBC with Differential/Platelet; Future -     Comprehensive metabolic panel; Future  Encounter for lipid screening for cardiovascular disease She is not fasting this morning, she will return for fasting labs. -     Lipid panel; Future   . Reviewed expectations re: course of current medical issues. . Discussed self-management of symptoms. . Outlined signs and symptoms indicating need for more acute intervention. . Patient verbalized understanding and all questions were answered. . See orders for this visit as documented in the electronic medical record. . Patient received an After-Visit Summary.  CMA or Lindsey Sellers  served as scribe during this visit. History, Physical, and Plan performed by medical provider. Documentation and orders reviewed and attested to.  Lindsey Coke, PA-C

## 2016-10-10 ENCOUNTER — Ambulatory Visit (INDEPENDENT_AMBULATORY_CARE_PROVIDER_SITE_OTHER): Payer: 59 | Admitting: Physician Assistant

## 2016-10-10 ENCOUNTER — Encounter: Payer: Self-pay | Admitting: Physician Assistant

## 2016-10-10 ENCOUNTER — Encounter: Payer: Self-pay | Admitting: *Deleted

## 2016-10-10 VITALS — BP 138/84 | HR 89 | Temp 98.8°F | Ht 64.0 in | Wt 144.2 lb

## 2016-10-10 DIAGNOSIS — Z0000 Encounter for general adult medical examination without abnormal findings: Secondary | ICD-10-CM

## 2016-10-10 DIAGNOSIS — Z136 Encounter for screening for cardiovascular disorders: Secondary | ICD-10-CM

## 2016-10-10 DIAGNOSIS — Z13220 Encounter for screening for lipoid disorders: Secondary | ICD-10-CM

## 2016-10-10 NOTE — Patient Instructions (Signed)
It was great meeting you!  Please make an appointment with the lab on your way out. I would like for you to return for lab work within 1-2 weeks. After midnight on the day of the lab draw, please do not eat anything. You may have water, black coffee, unsweetened tea.   Health Maintenance, Female Adopting a healthy lifestyle and getting preventive care can go a long way to promote health and wellness. Talk with your health care provider about what schedule of regular examinations is right for you. This is a good chance for you to check in with your provider about disease prevention and staying healthy. In between checkups, there are plenty of things you can do on your own. Experts have done a lot of research about which lifestyle changes and preventive measures are most likely to keep you healthy. Ask your health care provider for more information. Weight and diet Eat a healthy diet  Be sure to include plenty of vegetables, fruits, low-fat dairy products, and lean protein.  Do not eat a lot of foods high in solid fats, added sugars, or salt.  Get regular exercise. This is one of the most important things you can do for your health. ? Most adults should exercise for at least 150 minutes each week. The exercise should increase your heart rate and make you sweat (moderate-intensity exercise). ? Most adults should also do strengthening exercises at least twice a week. This is in addition to the moderate-intensity exercise.  Maintain a healthy weight  Body mass index (BMI) is a measurement that can be used to identify possible weight problems. It estimates body fat based on height and weight. Your health care provider can help determine your BMI and help you achieve or maintain a healthy weight.  For females 20 years of age and older: ? A BMI below 18.5 is considered underweight. ? A BMI of 18.5 to 24.9 is normal. ? A BMI of 25 to 29.9 is considered overweight. ? A BMI of 30 and above is  considered obese.  Watch levels of cholesterol and blood lipids  You should start having your blood tested for lipids and cholesterol at 33 years of age, then have this test every 5 years.  You may need to have your cholesterol levels checked more often if: ? Your lipid or cholesterol levels are high. ? You are older than 33 years of age. ? You are at high risk for heart disease.  Cancer screening Lung Cancer  Lung cancer screening is recommended for adults 55-80 years old who are at high risk for lung cancer because of a history of smoking.  A yearly low-dose CT scan of the lungs is recommended for people who: ? Currently smoke. ? Have quit within the past 15 years. ? Have at least a 30-pack-year history of smoking. A pack year is smoking an average of one pack of cigarettes a day for 1 year.  Yearly screening should continue until it has been 15 years since you quit.  Yearly screening should stop if you develop a health problem that would prevent you from having lung cancer treatment.  Breast Cancer  Practice breast self-awareness. This means understanding how your breasts normally appear and feel.  It also means doing regular breast self-exams. Let your health care provider know about any changes, no matter how small.  If you are in your 20s or 30s, you should have a clinical breast exam (CBE) by a health care provider every 1-3 years   part of a regular health exam.  If you are 40 or older, have a CBE every year. Also consider having a breast X-ray (mammogram) every year.  If you have a family history of breast cancer, talk to your health care provider about genetic screening.  If you are at high risk for breast cancer, talk to your health care provider about having an MRI and a mammogram every year.  Breast cancer gene (BRCA) assessment is recommended for women who have family members with BRCA-related cancers. BRCA-related cancers  include: ? Breast. ? Ovarian. ? Tubal. ? Peritoneal cancers.  Results of the assessment will determine the need for genetic counseling and BRCA1 and BRCA2 testing.  Cervical Cancer Your health care provider may recommend that you be screened regularly for cancer of the pelvic organs (ovaries, uterus, and vagina). This screening involves a pelvic examination, including checking for microscopic changes to the surface of your cervix (Pap test). You may be encouraged to have this screening done every 3 years, beginning at age 66.  For women ages 23-65, health care providers may recommend pelvic exams and Pap testing every 3 years, or they may recommend the Pap and pelvic exam, combined with testing for human papilloma virus (HPV), every 5 years. Some types of HPV increase your risk of cervical cancer. Testing for HPV may also be done on women of any age with unclear Pap test results.  Other health care providers may not recommend any screening for nonpregnant women who are considered low risk for pelvic cancer and who do not have symptoms. Ask your health care provider if a screening pelvic exam is right for you.  If you have had past treatment for cervical cancer or a condition that could lead to cancer, you need Pap tests and screening for cancer for at least 20 years after your treatment. If Pap tests have been discontinued, your risk factors (such as having a new sexual partner) need to be reassessed to determine if screening should resume. Some women have medical problems that increase the chance of getting cervical cancer. In these cases, your health care provider may recommend more frequent screening and Pap tests.  Colorectal Cancer  This type of cancer can be detected and often prevented.  Routine colorectal cancer screening usually begins at 33 years of age and continues through 33 years of age.  Your health care provider may recommend screening at an earlier age if you have risk factors  for colon cancer.  Your health care provider may also recommend using home test kits to check for hidden blood in the stool.  A small camera at the end of a tube can be used to examine your colon directly (sigmoidoscopy or colonoscopy). This is done to check for the earliest forms of colorectal cancer.  Routine screening usually begins at age 84.  Direct examination of the colon should be repeated every 5-10 years through 33 years of age. However, you may need to be screened more often if early forms of precancerous polyps or small growths are found.  Skin Cancer  Check your skin from head to toe regularly.  Tell your health care provider about any new moles or changes in moles, especially if there is a change in a mole's shape or color.  Also tell your health care provider if you have a mole that is larger than the size of a pencil eraser.  Always use sunscreen. Apply sunscreen liberally and repeatedly throughout the day.  Protect yourself by wearing  long sleeves, pants, a wide-brimmed hat, and sunglasses whenever you are outside.  Heart disease, diabetes, and high blood pressure  High blood pressure causes heart disease and increases the risk of stroke. High blood pressure is more likely to develop in: ? People who have blood pressure in the high end of the normal range (130-139/85-89 mm Hg). ? People who are overweight or obese. ? People who are African American.  If you are 84-53 years of age, have your blood pressure checked every 3-5 years. If you are 36 years of age or older, have your blood pressure checked every year. You should have your blood pressure measured twice-once when you are at a hospital or clinic, and once when you are not at a hospital or clinic. Record the average of the two measurements. To check your blood pressure when you are not at a hospital or clinic, you can use: ? An automated blood pressure machine at a pharmacy. ? A home blood pressure monitor.  If  you are between 81 years and 46 years old, ask your health care provider if you should take aspirin to prevent strokes.  Have regular diabetes screenings. This involves taking a blood sample to check your fasting blood sugar level. ? If you are at a normal weight and have a low risk for diabetes, have this test once every three years after 33 years of age. ? If you are overweight and have a high risk for diabetes, consider being tested at a younger age or more often. Preventing infection Hepatitis B  If you have a higher risk for hepatitis B, you should be screened for this virus. You are considered at high risk for hepatitis B if: ? You were born in a country where hepatitis B is common. Ask your health care provider which countries are considered high risk. ? Your parents were born in a high-risk country, and you have not been immunized against hepatitis B (hepatitis B vaccine). ? You have HIV or AIDS. ? You use needles to inject street drugs. ? You live with someone who has hepatitis B. ? You have had sex with someone who has hepatitis B. ? You get hemodialysis treatment. ? You take certain medicines for conditions, including cancer, organ transplantation, and autoimmune conditions.  Hepatitis C  Blood testing is recommended for: ? Everyone born from [birth year] through 1967. ? Anyone with known risk factors for hepatitis C.  Sexually transmitted infections (STIs)  You should be screened for sexually transmitted infections (STIs) including gonorrhea and chlamydia if: ? You are sexually active and are younger than 33 years of age. ? You are older than 33 years of age and your health care provider tells you that you are at risk for this type of infection. ? Your sexual activity has changed since you were last screened and you are at an increased risk for chlamydia or gonorrhea. Ask your health care provider if you are at risk.  If you do not have HIV, but are at risk, it may be recommended  that you take a prescription medicine daily to prevent HIV infection. This is called pre-exposure prophylaxis (PrEP). You are considered at risk if: ? You are sexually active and do not regularly use condoms or know the HIV status of your partner(s). ? You take drugs by injection. ? You are sexually active with a partner who has HIV.  Talk with your health care provider about whether you are at high risk of being infected with HIV.  If you choose to begin PrEP, you should first be tested for HIV. You should then be tested every 3 months for as long as you are taking PrEP. Pregnancy  If you are premenopausal and you may become pregnant, ask your health care provider about preconception counseling.  If you may become pregnant, take 400 to 800 micrograms (mcg) of folic acid every day.  If you want to prevent pregnancy, talk to your health care provider about birth control (contraception). Osteoporosis and menopause  Osteoporosis is a disease in which the bones lose minerals and strength with aging. This can result in serious bone fractures. Your risk for osteoporosis can be identified using a bone density scan.  If you are 8 years of age or older, or if you are at risk for osteoporosis and fractures, ask your health care provider if you should be screened.  Ask your health care provider whether you should take a calcium or vitamin D supplement to lower your risk for osteoporosis.  Menopause may have certain physical symptoms and risks.  Hormone replacement therapy may reduce some of these symptoms and risks. Talk to your health care provider about whether hormone replacement therapy is right for you. Follow these instructions at home:  Schedule regular health, dental, and eye exams.  Stay current with your immunizations.  Do not use any tobacco products including cigarettes, chewing tobacco, or electronic cigarettes.  If you are pregnant, do not drink alcohol.  If you are  breastfeeding, limit how much and how often you drink alcohol.  Limit alcohol intake to no more than 1 drink per day for nonpregnant women. One drink equals 12 ounces of beer, 5 ounces of wine, or 1 ounces of hard liquor.  Do not use street drugs.  Do not share needles.  Ask your health care provider for help if you need support or information about quitting drugs.  Tell your health care provider if you often feel depressed.  Tell your health care provider if you have ever been abused or do not feel safe at home. This information is not intended to replace advice given to you by your health care provider. Make sure you discuss any questions you have with your health care provider. Document Released: 10/23/2010 Document Revised: 09/15/2015 Document Reviewed: 01/11/2015 Elsevier Interactive Patient Education  [employer].

## 2016-10-16 ENCOUNTER — Other Ambulatory Visit (INDEPENDENT_AMBULATORY_CARE_PROVIDER_SITE_OTHER): Payer: 59

## 2016-10-16 DIAGNOSIS — Z13220 Encounter for screening for lipoid disorders: Secondary | ICD-10-CM

## 2016-10-16 DIAGNOSIS — Z0000 Encounter for general adult medical examination without abnormal findings: Secondary | ICD-10-CM | POA: Diagnosis not present

## 2016-10-16 DIAGNOSIS — Z136 Encounter for screening for cardiovascular disorders: Secondary | ICD-10-CM | POA: Diagnosis not present

## 2016-10-16 LAB — COMPREHENSIVE METABOLIC PANEL
ALT: 21 U/L (ref 0–35)
AST: 16 U/L (ref 0–37)
Albumin: 4.8 g/dL (ref 3.5–5.2)
Alkaline Phosphatase: 29 U/L — ABNORMAL LOW (ref 39–117)
BUN: 8 mg/dL (ref 6–23)
CHLORIDE: 105 meq/L (ref 96–112)
CO2: 26 mEq/L (ref 19–32)
Calcium: 9.7 mg/dL (ref 8.4–10.5)
Creatinine, Ser: 0.82 mg/dL (ref 0.40–1.20)
GFR: 85.21 mL/min (ref >60.00)
GLUCOSE: 87 mg/dL (ref 70–99)
POTASSIUM: 4 meq/L (ref 3.5–5.1)
SODIUM: 138 meq/L (ref 135–145)
TOTAL PROTEIN: 7.4 g/dL (ref 6.0–8.3)
Total Bilirubin: 0.4 mg/dL (ref 0.2–1.2)

## 2016-10-16 LAB — LIPID PANEL
CHOL/HDL RATIO: 2
Cholesterol: 159 mg/dL (ref 0–200)
HDL: 66 mg/dL (ref >39.00)
LDL CALC: 79 mg/dL (ref 0–99)
NONHDL: 92.79
Triglycerides: 68 mg/dL (ref 0.0–149.0)
VLDL: 13.6 mg/dL (ref 0.0–40.0)

## 2016-10-16 LAB — CBC WITH DIFFERENTIAL/PLATELET
Basophils Absolute: 0 10*3/uL (ref 0.0–0.1)
Basophils Relative: 0.4 % (ref 0.0–3.0)
EOS PCT: 2.1 % (ref 0.0–5.0)
Eosinophils Absolute: 0.1 10*3/uL (ref 0.0–0.7)
HCT: 43.8 % (ref 36.0–46.0)
Hemoglobin: 15.1 g/dL — ABNORMAL HIGH (ref 12.0–15.0)
LYMPHS ABS: 2 10*3/uL (ref 0.7–4.0)
Lymphocytes Relative: 35.7 % (ref 12.0–46.0)
MCHC: 34.5 g/dL (ref 30.0–36.0)
MCV: 96.2 fl (ref 78.0–100.0)
MONO ABS: 0.3 10*3/uL (ref 0.1–1.0)
Monocytes Relative: 6.3 % (ref 3.0–12.0)
NEUTROS PCT: 55.5 % (ref 43.0–77.0)
Neutro Abs: 3.1 10*3/uL (ref 1.4–7.7)
Platelets: 217 10*3/uL (ref 150.0–400.0)
RBC: 4.56 Mil/uL (ref 3.87–5.11)
RDW: 12.6 % (ref 11.5–15.5)
WBC: 5.6 10*3/uL (ref 4.0–10.5)

## 2016-10-17 MED FILL — BLISOVI 24 FE TABLET: 1-20 | 84 days supply | Qty: 84 | Fill #0

## 2017-01-07 MED FILL — BLISOVI 24 FE TABLET: 1-20 | 84 days supply | Qty: 84 | Fill #1

## 2017-04-23 NOTE — L&D Delivery Note (Addendum)
OB/GYN Faculty Practice Delivery Note  Lindsey Sellers is a 32 y.o. G1P0000 s/p SVD at [redacted]w[redacted]d. She was admitted for SOL.   ROM: 7h 71m with meconium fluid GBS Status: negative Maximum Maternal Temperature: 98.70F  Labor Progress: . No augmentation or epidural  Delivery Date/Time: 0227 Delivery: Called to room and patient was complete and pushing. Head delivered ROA. No nuchal cord present. Shoulder and body delivered in usual fashion. Infant with spontaneous cry, placed on mother's abdomen, dried and stimulated. Cord clamped x 2 after 1-minute delay, and cut by FOB. Cord blood drawn. Placenta delivered spontaneously with gentle cord traction. Fundus firm with massage and Pitocin. Labia, perineum, vagina, and cervix inspected inspected with 3rd degree laceration noted, specifically 3A after rectal exam. External anal spinchter repaired with 4.0 Vicryl, remainder of repair completed with 3.0 Vicryl.    Placenta: intact Complications: none Lacerations: 3rd degree EBL: 435ml  Infant: vigorous female  APGARs 9 and 9  weight pending   Orlene Plum, MD Family Medicine Resident     [facility] [place]  I was gloved and present for the delivery in its entirety, and I agree with the above resident's note.    Phill Myron, D.O. OB Fellow  03/06/2018, 9:43 AM

## 2017-06-18 DIAGNOSIS — H5213 Myopia, bilateral: Secondary | ICD-10-CM | POA: Diagnosis not present

## 2017-06-30 ENCOUNTER — Encounter: Payer: Self-pay | Admitting: Certified Nurse Midwife

## 2017-07-01 ENCOUNTER — Telehealth: Payer: Self-pay | Admitting: Certified Nurse Midwife

## 2017-07-01 NOTE — Telephone Encounter (Signed)
Patient sent the following correspondence through [place]. Routing to triage to assist patient with request.  ----- Message from [place], Generic sent at 07/01/2017 10:35 AM EDT -----    I'll take 07/02/2017 at 4 pm. thank you!  ----- Message -----  From: Nurse Naaman Plummer  Sent: 07/01/2017 9:33 AM EDT  To: Marella Bile  Subject: [id number] UPT  Jerel Shepherd CNM has openings on 07/02/2017 at 4 pm, Wednesday 07/03/2017 at 2:45 pm, and Thursday 07/04/2017 at 4 pm. Do any of these dates work for you?    Sincerely,    Reesa Chew, RN    ----- Message -----   From: Marella Bile   Sent: 07/01/2017 9:28 AM EDT    To: Nurse Harley Hallmark S  Subject: [id number] UPT    Thanks, Kaitlyn! Today after 4, or Tuesday-Friday after 230p work for me this week. Does she have any availability then?   ----- Message -----  From: Nurse Naaman Plummer  Sent: 07/01/2017 8:27 AM EDT  To: Marella Bile  Subject: Positive UPT  [name],    Please contact the office at your convenience to schedule and office visit with Melvia Heaps CNM to confirm pregnancy and discuss next steps in your care. If there is a day that you could come in I would be happy to look at her availability for you.    Sincerely,    Reesa Chew, RN   I called the patient and left her a message to call back or send a message through [place] because the appointment on 08/02/17 at 4:00 PM is no longer available.

## 2017-07-01 NOTE — Telephone Encounter (Signed)
-----   Message from [place], Generic sent at 06/30/2017 3:49 PM EDT -----    Hello! I stopped taking my birth control in December. My period was set for last Wednesday the 6th and I felt like I had normal PMS symptoms, some nausea, cramping etc. well, my period never arrived and I took a test yesterday and it is positive. Could those symptoms have been related to early pregnancy instead? What are my next steps, should I make an appointment with you or my PCP?   Thanks!  Lindsey Sellers

## 2017-07-04 ENCOUNTER — Ambulatory Visit: Payer: 59 | Admitting: Certified Nurse Midwife

## 2017-07-04 ENCOUNTER — Encounter: Payer: Self-pay | Admitting: Certified Nurse Midwife

## 2017-07-04 ENCOUNTER — Other Ambulatory Visit: Payer: Self-pay

## 2017-07-04 VITALS — BP 122/80 | HR 80 | Ht 63.75 in | Wt 149.0 lb

## 2017-07-04 DIAGNOSIS — N926 Irregular menstruation, unspecified: Secondary | ICD-10-CM | POA: Diagnosis not present

## 2017-07-04 DIAGNOSIS — Z3201 Encounter for pregnancy test, result positive: Secondary | ICD-10-CM | POA: Diagnosis not present

## 2017-07-04 LAB — POCT URINE PREGNANCY: PREG TEST UR: POSITIVE — AB

## 2017-07-04 NOTE — Progress Notes (Signed)
32 y.o.marital white female g0p0 presents with amenorrhea with + UPT on 06/29/17. LMP 05/28/17. Planned  pregnancy. Complaining of breast tenderness, fatigue, nausea. Denies spotting, bleeding or cramping. Medications she is taking are: Prenatal vitamin and probiotic and Omega Fish oil.  . Patient has consumed alcohol since + UPT.  Spouse supportive and excited. Has cats, but uses gloves and mask for litter when spouse not available. Aware of concerns with cats.   O: HPI pertinent to above. Healthy WDWN female Affect: normal, orientation x 3  Last Aex: 08/10/16 Pap smear: negative 08/10/16            Rubella screen: not done  A: Amenorrhea with positive UPT  5 wk 1 days per [facility]MP with [facility] 03/04/18 Planned pregnancy    P: Reviewed with patient importance of prenatal care during pregnancy. Given OB provider list. Reviewed nutrition importance of pregnancy and selecting from all food groups and making sure to have adequate protein intake daily. Discussed avoiding raw or exotic fish, soft cheeses due to risk of bacteria . Discussed concerns with FAS with alcohol use in pregnancy. Discussed increase of IUGR and SIDS with smoking use or second smoke. Reviewed warning signs of early pregnancy and need to advise if occurs. Discussed comfort measures for early pregnancy changes. Offered viability PUS here prior to initiating prenatal care. Patient  plans to have PUS here. She will be called with insurance information and scheduled. Questions addressed at length.  Labs:none  Rv prn  Time spent with patient in face to face counseling regarding pregnancy and prenatal care 38  minutes

## 2017-07-04 NOTE — Patient Instructions (Signed)

## 2017-07-05 NOTE — Telephone Encounter (Signed)
Patient was seen on 07/04/2017 with Melvia Heaps CNM. Please see OV note. Encounter closed.

## 2017-07-09 ENCOUNTER — Telehealth: Payer: Self-pay | Admitting: Certified Nurse Midwife

## 2017-07-09 ENCOUNTER — Other Ambulatory Visit: Payer: Self-pay | Admitting: *Deleted

## 2017-07-09 DIAGNOSIS — Z3201 Encounter for pregnancy test, result positive: Secondary | ICD-10-CM

## 2017-07-09 DIAGNOSIS — N926 Irregular menstruation, unspecified: Secondary | ICD-10-CM

## 2017-07-09 NOTE — Telephone Encounter (Signed)
Call placed to patient to review benefits and schedule recommended ultrasound. Left voicemail message requesting a return call °

## 2017-07-09 NOTE — Telephone Encounter (Signed)
Patient returned call. Reviewed benefit for an ultrasound. Patient understood and agreeable. Patient ready to schedule. Patient scheduled 07/25/17 with Dr Sabra Heck. Patient aware of appointment date, arrival time and cancellation policy. No further questions. Ok to close   cc: Dr Sabra Heck  cc: Meyer Russel, CNM

## 2017-07-15 ENCOUNTER — Telehealth: Payer: Self-pay | Admitting: Certified Nurse Midwife

## 2017-07-15 ENCOUNTER — Encounter: Payer: Self-pay | Admitting: Certified Nurse Midwife

## 2017-07-15 NOTE — Telephone Encounter (Signed)
Please advise on magnesium. Okay to advised may take Colace 100 mg OTC daily as well as increasing water and fiber intake?

## 2017-07-15 NOTE — Telephone Encounter (Signed)
Message   ----- Message from [place], Generic sent at 07/15/2017 8:27 AM EDT -----    Good morning! I am experiencing some constipation with this pregnancy (first time for everything I guess!). It doesn't appear that I can take many medications, but I have taken advice from the internet and tried to up my intake of fiber and work outs. Is magnesium safe to take? Would you recommend I try that for some relief?

## 2017-07-16 NOTE — Telephone Encounter (Signed)
Spoke with patient. Advised of message as seen below from [place]. Patient verbalizes understanding. Encounter closed.

## 2017-07-16 NOTE — Telephone Encounter (Signed)
She should have magnesium in prenatal vitamins. Ok to do [employer]

## 2017-07-25 ENCOUNTER — Ambulatory Visit (INDEPENDENT_AMBULATORY_CARE_PROVIDER_SITE_OTHER): Payer: 59 | Admitting: Obstetrics & Gynecology

## 2017-07-25 ENCOUNTER — Ambulatory Visit (INDEPENDENT_AMBULATORY_CARE_PROVIDER_SITE_OTHER): Payer: 59

## 2017-07-25 VITALS — BP 122/80 | HR 80 | Resp 14

## 2017-07-25 DIAGNOSIS — Z3201 Encounter for pregnancy test, result positive: Secondary | ICD-10-CM | POA: Diagnosis not present

## 2017-07-25 DIAGNOSIS — N926 Irregular menstruation, unspecified: Secondary | ICD-10-CM

## 2017-07-25 DIAGNOSIS — Z3491 Encounter for supervision of normal pregnancy, unspecified, first trimester: Secondary | ICD-10-CM | POA: Diagnosis not present

## 2017-07-25 NOTE — Progress Notes (Signed)
32 y.o. G36P0000 Married Caucasian female here for viability scan.  Feels pretty good.  Very excited.  Spouse with her today.  Denies vaginal bleeding.  Having some RLQ pain with coughing.  LMP:  05/27/2017.  EGA:  8 3/7 wks   EDC:  03/03/18  Findings:  UTERUS: Single gestational IUP with CRL 2.08cm.  Yolk sac, gestational sac noted.  FCA at 172 BPM noted.  CWD. ADNEXA: Left ovary: 1.5 x 0.9cm       Right ovary: 3.0 x 2.0cm with 2.0cm corpus luteal cyst CUL DE SAC: no free fluid  Discussion:  Findings reviewed.  Discussed specific obstetrical issues/concerns.  No cats in the home.   Tdap 02/14/09.  Had chicken pox.  Aware flu vaccine safe and recommended.  Her is UTD.    Fish/shellfish/mercury discuss.  Unpasteurized cheese/juices discussed.  Nitrites in foods disucssed.    Fetal DNA particle testing discussed.  First trimester down's testing discussed.  Cystic fibrosis discussed..  Assessment:  [place] IUP, CWD  Plan:  Reviewed options for obstetrical care.  Things she knows where she wants to transfer care.  As they are on Epic, will not need transfer of records but if changes mind, knows to call so records can be sent.  ~20 minutes spent with patient >50% of time was in face to face discussion of above.

## 2017-07-28 ENCOUNTER — Encounter: Payer: Self-pay | Admitting: Obstetrics & Gynecology

## 2017-08-13 ENCOUNTER — Encounter: Payer: Self-pay | Admitting: Obstetrics & Gynecology

## 2017-08-13 ENCOUNTER — Ambulatory Visit (INDEPENDENT_AMBULATORY_CARE_PROVIDER_SITE_OTHER): Payer: 59 | Admitting: Obstetrics & Gynecology

## 2017-08-13 DIAGNOSIS — Z3400 Encounter for supervision of normal first pregnancy, unspecified trimester: Secondary | ICD-10-CM | POA: Diagnosis not present

## 2017-08-13 DIAGNOSIS — Z3401 Encounter for supervision of normal first pregnancy, first trimester: Secondary | ICD-10-CM

## 2017-08-13 DIAGNOSIS — Z113 Encounter for screening for infections with a predominantly sexual mode of transmission: Secondary | ICD-10-CM

## 2017-08-13 DIAGNOSIS — Z3689 Encounter for other specified antenatal screening: Secondary | ICD-10-CM

## 2017-08-13 DIAGNOSIS — Z3687 Encounter for antenatal screening for uncertain dates: Secondary | ICD-10-CM

## 2017-08-13 NOTE — Progress Notes (Signed)
Bedside U/S showed single IUP with FHT of 166 BPM  CRL measures 47.50mm  GA is [redacted]w[redacted]d

## 2017-08-13 NOTE — Progress Notes (Signed)
  Subjective:    Lindsey Sellers is being seen today for her first obstetrical visit.  This is a planned pregnancy. She is at [redacted]w[redacted]d gestation. Her obstetrical history is significant for none. Relationship with FOB: spouse, living together. Patient does intend to breast feed. Pregnancy history fully reviewed.  Patient reports no complaints.  Review of Systems:   Review of Systems  Objective:     BP 114/78   Pulse 82   Wt 148 lb (67.1 kg)   LMP 05/27/2017   BMI 25.60 kg/m  Physical Exam  Exam Breathing, conversing, and ambulating normally Well nourished, well hydrated White female, no apparent distress Heart- rrr Lungs- CTAB Abd- benign   Assessment:    Pregnancy: G1P0000 Patient Active Problem List   Diagnosis Date Noted  . Supervision of normal first pregnancy, antepartum 08/13/2017       Plan:     Initial labs drawn. Prenatal vitamins. Problem list reviewed and updated. AFP3 discussed: declined. Role of ultrasound in pregnancy discussed; fetal survey: requested. Amniocentesis discussed: not indicated. She opt for [employer] with optimal schedule   Emily Filbert 08/13/2017

## 2017-08-14 LAB — OBSTETRIC PANEL
Antibody Screen: NOT DETECTED
BASOS ABS: 16 {cells}/uL (ref 0–200)
Basophils Relative: 0.2 %
Eosinophils Absolute: 81 cells/uL (ref 15–500)
Eosinophils Relative: 1 %
HCT: 34.6 % — ABNORMAL LOW (ref 35.0–45.0)
HEP B S AG: NONREACTIVE
Hemoglobin: 12.4 g/dL (ref 11.7–15.5)
LYMPHS ABS: 1798 {cells}/uL (ref 850–3900)
MCH: 32.8 pg (ref 27.0–33.0)
MCHC: 35.8 g/dL (ref 32.0–36.0)
MCV: 91.5 fL (ref 80.0–100.0)
MPV: 10.9 fL (ref 7.5–12.5)
Monocytes Relative: 5.8 %
NEUTROS PCT: 70.8 %
Neutro Abs: 5735 cells/uL (ref 1500–7800)
PLATELETS: 224 10*3/uL (ref 140–400)
RBC: 3.78 10*6/uL — AB (ref 3.80–5.10)
RDW: 11.9 % (ref 11.0–15.0)
RPR Ser Ql: NONREACTIVE
RUBELLA: 2.23 {index}
Total Lymphocyte: 22.2 %
WBC mixed population: 470 cells/uL (ref 200–950)
WBC: 8.1 10*3/uL (ref 3.8–10.8)

## 2017-08-14 LAB — GC/CHLAMYDIA PROBE AMP (~~LOC~~) NOT AT ARMC
Chlamydia: NEGATIVE
Neisseria Gonorrhea: NEGATIVE

## 2017-08-14 LAB — HIV ANTIBODY (ROUTINE TESTING W REFLEX): HIV: NONREACTIVE

## 2017-08-15 ENCOUNTER — Ambulatory Visit: Payer: 59 | Admitting: Certified Nurse Midwife

## 2017-08-16 LAB — URINE CULTURE, OB REFLEX: ORGANISM ID, BACTERIA: NO GROWTH

## 2017-08-16 LAB — CULTURE, OB URINE

## 2017-08-19 LAB — CYSTIC FIBROSIS DIAGNOSTIC STUDY

## 2017-08-21 DIAGNOSIS — Z3400 Encounter for supervision of normal first pregnancy, unspecified trimester: Secondary | ICD-10-CM

## 2017-08-28 ENCOUNTER — Encounter: Payer: Self-pay | Admitting: *Deleted

## 2017-10-02 ENCOUNTER — Encounter: Payer: Self-pay | Admitting: Obstetrics & Gynecology

## 2017-10-04 ENCOUNTER — Encounter: Payer: Self-pay | Admitting: Physician Assistant

## 2017-10-04 ENCOUNTER — Ambulatory Visit: Payer: 59 | Admitting: Physician Assistant

## 2017-10-04 VITALS — BP 120/68 | HR 96 | Temp 98.5°F | Ht 63.75 in | Wt 155.2 lb

## 2017-10-04 DIAGNOSIS — M7742 Metatarsalgia, left foot: Secondary | ICD-10-CM | POA: Diagnosis not present

## 2017-10-04 DIAGNOSIS — M7741 Metatarsalgia, right foot: Secondary | ICD-10-CM

## 2017-10-04 NOTE — Progress Notes (Signed)
Lindsey Sellers is a 32 y.o. female here for a new problem.  History of Present Illness:   Chief Complaint  Patient presents with  . Foot Pain    woke up monday morning and foot was hurting on the ball. She worked out on Sunday but doesn't recall hurting herself.    HPI    Patient is [redacted] weeks pregnant.  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 Does not  recall any inciting event.  She woke up Monday morning and her foot was hurting specifically on the ball of her foot. She is typically barefoot at home as often and can be, and during work she often wears non-supportive shoes such as ballet flats. Describes the pain as sharp and sometimes throbbing. Pain is as bad as a 7/10. Has not taken anything for pain.    Past Medical History:  Diagnosis Date  . Abnormal pap 04/2006  . 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 Marital status: Married    Spouse name: Not on file  . Number of children: Not on file  . Years of education: Not on file  . Highest education level: Not on file  Occupational History  . Not on file  Social Needs  . Financial resource strain: Not on file  . Food insecurity:    Worry: Not on file    Inability: Not on file  . Transportation needs:    Medical: Not on file    Non-medical: Not on file  Tobacco Use  . Smoking status: Never Smoker  . Smokeless tobacco: Never Used  Substance and Sexual Activity  . Alcohol use: Not Currently    Alcohol/week: 3.0 oz    Types: 2 Glasses of wine, 3 Standard drinks or equivalent per week  . Drug use: No  . Sexual activity: Yes    Partners: Male  Lifestyle  . Physical activity:    Days per week: Not on file    Minutes per session: Not on file  . Stress: Not on file  Relationships  . Social connections:    Talks on phone: Not on file    Gets together: Not on file    Attends religious service: Not on file    Active member  of club or organization: Not on file    Attends meetings of clubs or organizations: Not on file    Relationship status: Not on file  . Intimate partner violence:    Fear of current or ex partner: Not on file    Emotionally abused: Not on file    Physically abused: Not on file    Forced sexual activity: Not on file  Other Topics Concern  . 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 COLPOSCOPY  2008   no biopsy done  . WISDOM TOOTH EXTRACTION      Family History  Problem Relation Age of Onset  . Cancer Paternal Grandmother        lymph node  . Gout Father   . Colon cancer Neg Hx   . Breast cancer Neg  Hx     No Known Allergies  Current Medications:   Current Outpatient Medications:  .  Nutritional Supplements (DHEA PO), Take 1 tablet by mouth daily., Disp: , Rfl:  .  Prenatal Vit-Fe Fumarate-FA (PRENATAL VITAMIN PO), Take by mouth., Disp: , Rfl:    Review of Systems:   ROS  Negative unless otherwise specified per HPI.  Vitals:   Vitals:   10/04/17 1318  BP: 120/68  Pulse: 96  Temp: 98.5 F (36.9 C)  TempSrc: Oral  SpO2: 99%  Weight: 155 lb 3.2 oz (70.4 kg)  Height: 5' 3.75" (1.619 m)     Body mass index is 26.85 kg/m.  Physical Exam:   Physical Exam  Constitutional: She is oriented to person, place, and time. She appears well-developed and well-nourished.  HENT:  Head: Normocephalic and atraumatic.  Eyes: Conjunctivae and EOM are normal.  Neck: Normal range of motion. Neck supple.  Pulmonary/Chest: Effort normal.  Musculoskeletal: Normal range of motion.  Tenderness with palpation to MTP joints of sole of bilateral feet with slight swelling. No decreased ROM to active and passive movement.  Neurological: She  is alert and oriented to person, place, and time.  Skin: Skin is warm and dry.  Psychiatric: She has a normal mood and affect. Her behavior is normal. Judgment and thought content normal.    Assessment and Plan:    Lindsey Sellers was seen today for foot pain.  Diagnoses and all orders for this visit:  Metatarsalgia of both feet   Dr. Paulla Fore in to evaluate patient. Suspect patient would benefit from metatarsal pads in shoes. Discussed use of these as well as more supportive shoes. Recommended submerging feet in cool water to help with symptoms. Follow-up with Dr. Paulla Fore if symptoms worsen or persist. Also recommended Tylenol if needed for severe pain. Patient is agreeable to plan.  . Reviewed expectations re: course of current medical issues. . Discussed self-management of symptoms. . Outlined signs and symptoms indicating need for more acute intervention. . Patient verbalized understanding and all questions were answered. . See orders for this visit as documented in the electronic medical record. . Patient received an After-Visit Summary.   Inda Coke, PA-C

## 2017-10-07 ENCOUNTER — Encounter (HOSPITAL_COMMUNITY): Payer: Self-pay

## 2017-10-14 ENCOUNTER — Ambulatory Visit (HOSPITAL_COMMUNITY)
Admission: RE | Admit: 2017-10-14 | Discharge: 2017-10-14 | Disposition: A | Discharge status: Home / Self Care | Payer: 59 | Source: Ambulatory Visit | Attending: Obstetrics & Gynecology | Admitting: Obstetrics & Gynecology

## 2017-10-14 ENCOUNTER — Other Ambulatory Visit: Payer: Self-pay | Admitting: Obstetrics & Gynecology

## 2017-10-14 DIAGNOSIS — Z363 Encounter for antenatal screening for malformations: Secondary | ICD-10-CM

## 2017-10-14 DIAGNOSIS — Z3A20 20 weeks gestation of pregnancy: Secondary | ICD-10-CM

## 2017-10-14 DIAGNOSIS — Z3482 Encounter for supervision of other normal pregnancy, second trimester: Secondary | ICD-10-CM | POA: Diagnosis not present

## 2017-10-14 DIAGNOSIS — Z3400 Encounter for supervision of normal first pregnancy, unspecified trimester: Secondary | ICD-10-CM

## 2017-10-16 ENCOUNTER — Ambulatory Visit (INDEPENDENT_AMBULATORY_CARE_PROVIDER_SITE_OTHER): Payer: 59 | Admitting: Obstetrics & Gynecology

## 2017-10-16 ENCOUNTER — Ambulatory Visit (HOSPITAL_COMMUNITY): Payer: 59

## 2017-10-16 DIAGNOSIS — Z3400 Encounter for supervision of normal first pregnancy, unspecified trimester: Secondary | ICD-10-CM

## 2017-10-16 NOTE — Progress Notes (Signed)
   PRENATAL VISIT NOTE  Subjective:  Lindsey Sellers is a 32 y.o. G1P0000 at [redacted]w[redacted]d being seen today for ongoing prenatal care.  She is currently monitored for the following issues for this low-risk pregnancy and has Supervision of normal first pregnancy, antepartum on their problem list.  Patient reports no complaints.  Contractions: Not present. Vag. Bleeding: None.  Movement: Present. Denies leaking of fluid.   The following portions of the patient's history were reviewed and updated as appropriate: allergies, current medications, past family history, past medical history, past social history, past surgical history and problem list. Problem list updated.  Objective:   Vitals:   10/16/17 0814  BP: 102/71  Pulse: 76  Weight: 152 lb (68.9 kg)    Fetal Status: Fetal Heart Rate (bpm): 150   Movement: Present     General:  Alert, oriented and cooperative. Patient is in no acute distress.  Skin: Skin is warm and dry. No rash noted.   Cardiovascular: Normal heart rate noted  Respiratory: Normal respiratory effort, no problems with respiration noted  Abdomen: Soft, gravid, appropriate for gestational age.  Pain/Pressure: Absent     Pelvic: Cervical exam deferred        Extremities: Normal range of motion.  Edema: None  Mental Status: Normal mood and affect. Normal behavior. Normal judgment and thought content.   Assessment and Plan:  Pregnancy: G1P0000 at [redacted]w[redacted]d  1. Supervision of normal first pregnancy, antepartum   Preterm labor symptoms and general obstetric precautions including but not limited to vaginal bleeding, contractions, leaking of fluid and fetal movement were reviewed in detail with the patient. Please refer to After Visit Summary for other counseling recommendations.    Return in about 8 weeks (around 12/11/2017) for 2 hour GTT.  No future appointments.  Emily Filbert, MD

## 2017-12-13 ENCOUNTER — Ambulatory Visit (INDEPENDENT_AMBULATORY_CARE_PROVIDER_SITE_OTHER): Payer: 59 | Admitting: Certified Nurse Midwife

## 2017-12-13 ENCOUNTER — Other Ambulatory Visit: Payer: Self-pay | Admitting: Certified Nurse Midwife

## 2017-12-13 VITALS — BP 118/68 | HR 70 | Wt 165.0 lb

## 2017-12-13 DIAGNOSIS — Z3400 Encounter for supervision of normal first pregnancy, unspecified trimester: Secondary | ICD-10-CM | POA: Diagnosis not present

## 2017-12-13 DIAGNOSIS — Z3403 Encounter for supervision of normal first pregnancy, third trimester: Secondary | ICD-10-CM

## 2017-12-13 DIAGNOSIS — Z23 Encounter for immunization: Secondary | ICD-10-CM

## 2017-12-13 LAB — POCT URINALYSIS DIPSTICK OB
Glucose, UA: NEGATIVE — AB
POC,PROTEIN,UA: NEGATIVE

## 2017-12-13 NOTE — Patient Instructions (Signed)

## 2017-12-13 NOTE — Progress Notes (Signed)
Subjective:  Lindsey Sellers is a 32 y.o. G1P0000 at [redacted]w[redacted]d being seen today for ongoing prenatal care.  She is currently monitored for the following issues for this low-risk pregnancy and has Supervision of normal first pregnancy, antepartum on their problem list.  Patient reports no complaints.  Contractions: Irregular. Vag. Bleeding: None.  Movement: Present. Denies leaking of fluid.   The following portions of the patient's history were reviewed and updated as appropriate: allergies, current medications, past family history, past medical history, past social history, past surgical history and problem list. Problem list updated.  Objective:   Vitals:   12/13/17 0817  BP: 118/68  Pulse: 70  Weight: 74.8 kg    Fetal Status: Fetal Heart Rate (bpm): 141 Fundal Height: 28 cm Movement: Present     General:  Alert, oriented and cooperative. Patient is in no acute distress.  Skin: Skin is warm and dry. No rash noted.   Cardiovascular: Normal heart rate noted  Respiratory: Normal respiratory effort, no problems with respiration noted  Abdomen: Soft, gravid, appropriate for gestational age. Pain/Pressure: Absent     Pelvic: Vag. Bleeding: None Vag D/C Character: Thin   Cervical exam deferred        Extremities: Normal range of motion.  Edema: None  Mental Status: Normal mood and affect. Normal behavior. Normal judgment and thought content.   Urinalysis:      Assessment and Plan:  Pregnancy: G1P0000 at [redacted]w[redacted]d  1. Supervision of normal first pregnancy, antepartum - 2Hr GTT w/ 1 Hr Carpenter 75 g - HIV antibody (with reflex) - CBC - RPR - Tdap vaccine greater than or equal to 7yo IM - POC Urinalysis Dipstick OB  Preterm labor symptoms and general obstetric precautions including but not limited to vaginal bleeding, contractions, leaking of fluid and fetal movement were reviewed in detail with the patient. Please refer to After Visit Summary for other counseling recommendations.   Return in about 2 weeks (around 12/27/2017).   Julianne Handler, CNM

## 2017-12-14 ENCOUNTER — Encounter: Payer: Self-pay | Admitting: Certified Nurse Midwife

## 2017-12-16 ENCOUNTER — Telehealth: Payer: Self-pay

## 2017-12-16 LAB — CBC
HCT: 36.6 % (ref 35.0–45.0)
HEMOGLOBIN: 12.5 g/dL (ref 11.7–15.5)
MCH: 32.2 pg (ref 27.0–33.0)
MCHC: 34.2 g/dL (ref 32.0–36.0)
MCV: 94.3 fL (ref 80.0–100.0)
MPV: 11.1 fL (ref 7.5–12.5)
PLATELETS: 193 10*3/uL (ref 140–400)
RBC: 3.88 10*6/uL (ref 3.80–5.10)
RDW: 13.1 % (ref 11.0–15.0)
WBC: 11.3 10*3/uL — AB (ref 3.8–10.8)

## 2017-12-16 LAB — 2HR GTT W 1 HR, CARPENTER, 75 G
GLUCOSE, 1 HR, GEST: 144 mg/dL (ref 65–179)
GLUCOSE, FASTING, GEST: 71 mg/dL (ref 65–91)
Glucose, 2 Hr, Gest: 116 mg/dL (ref 65–152)

## 2017-12-16 LAB — HIV ANTIBODY (ROUTINE TESTING W REFLEX): HIV: NONREACTIVE

## 2017-12-16 LAB — RPR: RPR Ser Ql: NONREACTIVE

## 2017-12-16 NOTE — Telephone Encounter (Signed)
Left message on pt's voicemail letting her know that she passed GTT.

## 2018-01-02 ENCOUNTER — Encounter: Payer: Self-pay | Admitting: Certified Nurse Midwife

## 2018-01-10 ENCOUNTER — Ambulatory Visit (INDEPENDENT_AMBULATORY_CARE_PROVIDER_SITE_OTHER): Payer: 59

## 2018-01-10 DIAGNOSIS — Z3400 Encounter for supervision of normal first pregnancy, unspecified trimester: Secondary | ICD-10-CM

## 2018-01-10 NOTE — Patient Instructions (Addendum)
Safe Medications in Pregnancy   Acne: Benzoyl Peroxide Salicylic Acid  Backache/Headache: Tylenol: 2 regular strength every 4 hours OR              2 Extra strength every 6 hours  Colds/Coughs/Allergies: Benadryl (alcohol free) 25 mg every 6 hours as needed Breath right strips Claritin Cepacol throat lozenges Chloraseptic throat spray Cold-Eeze- up to three times per day Cough drops, alcohol free Flonase (by prescription only) Guaifenesin Mucinex Robitussin DM (plain only, alcohol free) Saline nasal spray/drops Sudafed (pseudoephedrine) & Actifed ** use only after [redacted] weeks gestation and if you do not have high blood pressure Tylenol Vicks Vaporub Zinc lozenges Zyrtec   Constipation: Colace Ducolax suppositories Fleet enema Glycerin suppositories Metamucil Milk of magnesia Miralax Senokot Smooth move tea  Diarrhea: Kaopectate Imodium A-D  *NO pepto Bismol  Hemorrhoids: Anusol Anusol HC Preparation H Tucks  Indigestion: Tums Maalox Mylanta Zantac  Pepcid  Insomnia: Benadryl (alcohol free) 25mg  every 6 hours as needed Tylenol PM Unisom, no Gelcaps  Leg Cramps: Tums MagGel  Nausea/Vomiting:  Bonine Dramamine Emetrol Ginger extract Sea bands Meclizine  Nausea medication to take during pregnancy:  Unisom (doxylamine succinate 25 mg tablets) Take one tablet daily at bedtime. If symptoms are not adequately controlled, the dose can be increased to a maximum recommended dose of two tablets daily (1/2 tablet in the morning, 1/2 tablet mid-afternoon and one at bedtime). Vitamin B6 100mg  tablets. Take one tablet twice a day (up to 200 mg per day).  Skin Rashes: Aveeno products Benadryl cream or 25mg  every 6 hours as needed Calamine Lotion 1% cortisone cream  Yeast infection: Gyne-lotrimin 7 Monistat 7   **If taking multiple medications, please check labels to avoid duplicating the same active ingredients **take medication as directed on  the label ** Do not exceed 4000 mg of tylenol in 24 hours **Do not take medications that contain aspirin or ibuprofen     Group B Streptococcus Infection During Pregnancy Group B Streptococcus (GBS) is a type of bacteria (Streptococcus agalactiae) that is often found in healthy people, commonly in the rectum, vagina, and intestines. In people who are healthy and not pregnant, the bacteria rarely cause serious illness or complications. However, women who test positive for GBS during pregnancy can pass the bacteria to their baby during childbirth, which can cause serious infection in the baby after birth. Women with GBS may also have infections during their pregnancy or immediately after childbirth, such as such as urinary tract infections (UTIs) or infections of the uterus (uterine infections). Having GBS also increases a woman's risk of complications during pregnancy, such as early (preterm) labor or delivery, miscarriage, or stillbirth. Routine testing (screening) for GBS is recommended for all pregnant women. What increases the risk? You may have a higher risk for GBS infection during pregnancy if you had one during a past pregnancy. What are the signs or symptoms? In most cases, GBS infection does not cause symptoms in pregnant women. Signs and symptoms of a possible GBS-related infection may include:  Labor starting before the 37th week of pregnancy.  A UTI or bladder infection, which may cause: ? Fever. ? Pain or burning during urination. ? Frequent urination.  Fever during labor, along with: ? Bad-smelling discharge. ? Uterine tenderness. ? Rapid heartbeat in the mother, baby, or both.  Rare but serious symptoms of a possible GBS-related infection in women include:  Blood infection (septicemia). This may cause fever, chills, or confusion.  Lung infection (pneumonia). This may cause  fever, chills, cough, rapid breathing, difficulty breathing, or chest pain.  Bone, joint, skin,  or soft tissue infection.  How is this diagnosed? You may be screened for GBS between week 35 and week 37 of your pregnancy. If you have symptoms of preterm labor, you may be screened earlier. This condition is diagnosed based on lab test results from:  A swab of fluid from the vagina and rectum.  A urine sample.  How is this treated? This condition is treated with antibiotic medicine. When you go into labor, or as soon as your water breaks (your membranes rupture), you will be given antibiotics through an IV tube. Antibiotics will continue until after you give birth. If you are having a cesarean delivery, you do not need antibiotics unless your membranes have already ruptured. Follow these instructions at home:  Take over-the-counter and prescription medicines only as told by your health care provider.  Take your antibiotic medicine as told by your health care provider. Do not stop taking the antibiotic even if you start to feel better.  Keep all pre-birth (prenatal) visits and follow-up visits as told by your health care provider. This is important. Contact a health care provider if:  You have pain or burning when you urinate.  You have to urinate frequently.  You have a fever or chills.  You develop a bad-smelling vaginal discharge. Get help right away if:  Your membranes rupture.  You go into labor.  You have severe pain in your abdomen.  You have difficulty breathing.  You have chest pain. This information is not intended to replace advice given to you by your health care provider. Make sure you discuss any questions you have with your health care provider. Document Released: 07/17/2007 Document Revised: 11/04/2015 Document Reviewed: 11/03/2015 Elsevier Interactive Patient Education  [employer].

## 2018-01-10 NOTE — Progress Notes (Signed)
   PRENATAL VISIT NOTE  Subjective:  Lindsey Sellers is a 32 y.o. G1P0000 at [redacted]w[redacted]d being seen today for ongoing prenatal care.  She is currently monitored for the following issues for this low-risk pregnancy and has Supervision of normal first pregnancy, antepartum on their problem list.  Patient reports no complaints.  Contractions: Not present. Vag. Bleeding: None.  Movement: Present. Denies leaking of fluid.   The following portions of the patient's history were reviewed and updated as appropriate: allergies, current medications, past family history, past medical history, past social history, past surgical history and problem list. Problem list updated.  Objective:   Vitals:   01/10/18 0812  BP: 108/78  Pulse: 91  Weight: 170 lb (77.1 kg)    Fetal Status: Fetal Heart Rate (bpm): 140 Fundal Height: 33 cm Movement: Present     General:  Alert, oriented and cooperative. Patient is in no acute distress.  Skin: Skin is warm and dry. No rash noted.   Cardiovascular: Normal heart rate noted  Respiratory: Normal respiratory effort, no problems with respiration noted  Abdomen: Soft, gravid, appropriate for gestational age.  Pain/Pressure: Absent     Pelvic: Cervical exam deferred        Extremities: Normal range of motion.  Edema: None  Mental Status: Normal mood and affect. Normal behavior. Normal judgment and thought content.   Assessment and Plan:  Pregnancy: G1P0000 at [redacted]w[redacted]d  1. Supervision of normal first pregnancy, antepartum - No complaints. Routine care - GBS next visit. Anticipatory guidance reviewed  Preterm labor symptoms and general obstetric precautions including but not limited to vaginal bleeding, contractions, leaking of fluid and fetal movement were reviewed in detail with the patient. Please refer to After Visit Summary for other counseling recommendations.  Return in about 4 weeks (around 02/07/2018) for Return OB visit.  Future Appointments  Date Time  Provider [place]  02/07/2018  8:45 AM Julianne Handler, CNM [id number]    Wende Mott, [place] 01/10/18 8:39 AM

## 2018-02-07 ENCOUNTER — Ambulatory Visit (INDEPENDENT_AMBULATORY_CARE_PROVIDER_SITE_OTHER): Payer: 59 | Admitting: Certified Nurse Midwife

## 2018-02-07 ENCOUNTER — Other Ambulatory Visit: Payer: Self-pay

## 2018-02-07 ENCOUNTER — Inpatient Hospital Stay (HOSPITAL_COMMUNITY)
Admission: AD | Admit: 2018-02-07 | Discharge: 2018-02-07 | Disposition: A | Discharge status: Home / Self Care | Payer: 59 | Source: Ambulatory Visit | Attending: Obstetrics and Gynecology | Admitting: Obstetrics and Gynecology

## 2018-02-07 ENCOUNTER — Encounter (HOSPITAL_COMMUNITY): Payer: Self-pay | Admitting: *Deleted

## 2018-02-07 VITALS — BP 154/101 | HR 89 | Wt 175.0 lb

## 2018-02-07 DIAGNOSIS — Z113 Encounter for screening for infections with a predominantly sexual mode of transmission: Secondary | ICD-10-CM | POA: Diagnosis not present

## 2018-02-07 DIAGNOSIS — Z3483 Encounter for supervision of other normal pregnancy, third trimester: Secondary | ICD-10-CM | POA: Diagnosis not present

## 2018-02-07 DIAGNOSIS — O163 Unspecified maternal hypertension, third trimester: Secondary | ICD-10-CM

## 2018-02-07 DIAGNOSIS — Z3A36 36 weeks gestation of pregnancy: Secondary | ICD-10-CM | POA: Insufficient documentation

## 2018-02-07 DIAGNOSIS — Z3400 Encounter for supervision of normal first pregnancy, unspecified trimester: Secondary | ICD-10-CM

## 2018-02-07 DIAGNOSIS — O26893 Other specified pregnancy related conditions, third trimester: Secondary | ICD-10-CM | POA: Diagnosis not present

## 2018-02-07 DIAGNOSIS — R030 Elevated blood-pressure reading, without diagnosis of hypertension: Secondary | ICD-10-CM | POA: Insufficient documentation

## 2018-02-07 LAB — COMPREHENSIVE METABOLIC PANEL
ALT: 19 U/L (ref 0–44)
AST: 20 U/L (ref 15–41)
Albumin: 3.5 g/dL (ref 3.5–5.0)
Alkaline Phosphatase: 129 U/L — ABNORMAL HIGH (ref 38–126)
Anion gap: 10 (ref 5–15)
BILIRUBIN TOTAL: 0.6 mg/dL (ref 0.3–1.2)
BUN: 8 mg/dL (ref 6–20)
CHLORIDE: 105 mmol/L (ref 98–111)
CO2: 17 mmol/L — ABNORMAL LOW (ref 22–32)
CREATININE: 0.53 mg/dL (ref 0.44–1.00)
Calcium: 8.7 mg/dL — ABNORMAL LOW (ref 8.9–10.3)
Glucose, Bld: 80 mg/dL (ref 70–99)
POTASSIUM: 3.6 mmol/L (ref 3.5–5.1)
Sodium: 132 mmol/L — ABNORMAL LOW (ref 135–145)
TOTAL PROTEIN: 6.5 g/dL (ref 6.5–8.1)

## 2018-02-07 LAB — CBC
HCT: 39.9 % (ref 36.0–46.0)
HEMOGLOBIN: 13.8 g/dL (ref 12.0–15.0)
MCH: 32.6 pg (ref 26.0–34.0)
MCHC: 34.6 g/dL (ref 30.0–36.0)
MCV: 94.3 fL (ref 80.0–100.0)
Platelets: 183 10*3/uL (ref 150–400)
RBC: 4.23 MIL/uL (ref 3.87–5.11)
RDW: 13 % (ref 11.5–15.5)
WBC: 10.5 10*3/uL (ref 4.0–10.5)
nRBC: 0 % (ref 0.0–0.2)

## 2018-02-07 LAB — POCT URINALYSIS DIPSTICK OB: Glucose, UA: NEGATIVE

## 2018-02-07 LAB — URINALYSIS, ROUTINE W REFLEX MICROSCOPIC
BILIRUBIN URINE: NEGATIVE
Glucose, UA: NEGATIVE mg/dL
Hgb urine dipstick: NEGATIVE
KETONES UR: NEGATIVE mg/dL
Leukocytes, UA: NEGATIVE
NITRITE: NEGATIVE
Protein, ur: NEGATIVE mg/dL
Specific Gravity, Urine: 1.01 (ref 1.005–1.030)
pH: 5 (ref 5.0–8.0)

## 2018-02-07 LAB — OB RESULTS CONSOLE GC/CHLAMYDIA: GC PROBE AMP, GENITAL: NEGATIVE

## 2018-02-07 LAB — PROTEIN / CREATININE RATIO, URINE
CREATININE, URINE: 54 mg/dL
PROTEIN CREATININE RATIO: 0.15 mg/mg{creat} (ref 0.00–0.15)
TOTAL PROTEIN, URINE: 8 mg/dL

## 2018-02-07 LAB — OB RESULTS CONSOLE GBS: STREP GROUP B AG: NEGATIVE

## 2018-02-07 NOTE — MAU Provider Note (Signed)
History    CSN: [id number] Arrival date and time: 02/07/18 1014  Chief Complaint  Patient presents with  . Hypertension   HPI  Lindsey Sellers is a 32yo G1P0 at [redacted]w[redacted]d who presents to MAU from clinic with elevated BP, headache, and visual changes yesterday. BP was elevated in clinic today, and she states she was very alarmed, so she and provider decided she should come here for evaluation. States recently she's just felt not quite right. Pregnancy has been uncomplicated. She states she has felt more uncomfortable the last 2-3 weeks. Stopped doing pure barre/cycling recently because difficult with her increased gestation. Gets headaches off/on normally, works with computer most of the day. Usually goes away on their own.Has had a couple of episodes of spots/flashes in her vision, had an episode yesterday. Denies headache, vision changes, abdominal pain today. Has had some worsening swelling over the last few weeks as well, but she sits at a desk most of the day. Swelling usually gets better by the morning. Is enrolled in [employer], so she takes her BP once a week in the morning. It is usually 100/70s. States she's been feeling more uncomfortable - baby boy kicking her, "stitches" in left side. Denies contractions, vaginal bleeding, leakage of fluid. Has felt like slightly more "moist" discharge but no itching, pain, seems normal to her.   She receives her prenatal [facility] at [facility]. Support person is her husband. Works in Engineer, technical sales for [employer].   OB History    Gravida  1   Para  0   Term  0   Preterm  0   AB  0   Living  0     SAB  0   TAB  0   Ectopic  0   Multiple  0   Live Births              Past Medical History:  Diagnosis Date  . Abnormal pap 04/2006  . 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 Medical history non-contributory     Past Surgical History:  Procedure Laterality Date  . COLPOSCOPY  2008   no biopsy done  . WISDOM TOOTH  EXTRACTION      Family History  Problem Relation Age of Onset  . Cancer Paternal Grandmother        lymph node  . Gout Father   . Colon cancer Neg Hx   . Breast cancer Neg Hx     Social History   Tobacco Use  . Smoking status: Never Smoker  . Smokeless tobacco: Never Used  Substance Use Topics  . Alcohol use: Not Currently    Alcohol/week: 5.0 standard drinks    Types: 2 Glasses of wine, 3 Standard drinks or equivalent per week  . Drug use: No    Allergies: No Known Allergies  No medications prior to admission.    Review of Systems  Constitutional: Positive for activity change. Negative for fatigue.  HENT: Negative for congestion.   Eyes: Positive for visual disturbance.  Respiratory: Negative for shortness of breath.   Cardiovascular: Positive for leg swelling. Negative for chest pain and palpitations.  Gastrointestinal: Negative for abdominal pain and nausea.  Genitourinary: Positive for flank pain. Negative for dysuria, vaginal discharge and vaginal pain.  Musculoskeletal: Negative for back pain.  Neurological: Positive for headaches. Negative for dizziness and tremors.  Psychiatric/Behavioral: Positive for sleep disturbance.   Physical Exam   Blood pressure 127/84, pulse 93, temperature 98.4 F (  36.9 C), temperature source Oral, resp. rate 20, weight 79.7 kg, last menstrual period 05/27/2017, SpO2 100 %.  Physical Exam  Nursing note and vitals reviewed. Constitutional: She is oriented to person, place, and time. She appears well-developed and well-nourished. No distress.  HENT:  Head: Normocephalic and atraumatic.  Eyes: Conjunctivae and EOM are normal. No scleral icterus.  Cardiovascular: Normal rate, regular rhythm, normal heart sounds and intact distal pulses.  No murmur heard. Respiratory: Effort normal and breath sounds normal. No respiratory distress. She has no wheezes.  GI: Soft. Bowel sounds are normal. There is no tenderness. There is no guarding.   gravid  Genitourinary:  Genitourinary Comments: deferred  Musculoskeletal: She exhibits no edema.  Neurological: She is alert and oriented to person, place, and time.  Skin: Skin is warm and dry. No rash noted.  Psychiatric: She has a normal mood and affect. Her behavior is normal.   MAU Course  Procedures  MDM -- elevated BP in clinic, first BP elevated 143/93, next BP wnl -- will check HELLP labs, UPC, and U/A given flank pain  -- CBC wnl, CMP wnl, UPC 0.15, U/A without evidence of infection  -- REACTIVE NST: 130s/mod/+a/-d; no contractions   Assessment and Plan  Lindsey Sellers is a 32yo G1P0 at [redacted]w[redacted]d who presents to MAU from clinic with elevated BP, headache, and visual changes yesterday. Blood pressures in MAU were normal to mild range elevated. HELLP labs wnl and UPC 0.15. Consulted with Dr. Elly Modena regarding plan of care. Given no severe features at this time, will plan for BP recheck in 3 days when 37 weeks. If BP elevated at that time, will plan for induction of labor for gestational hypertension. Counseled patient on signs of severe preeclampsia and reasons to return to MAU over the weekend. She is enrolled in [employer] and has a blood pressure cuff at home, so instructed to monitor BP daily and return I severe range BP. Patient voiced understanding of plan. Will call clinic for appointment for BP check in 3 days or return to MAU if unavailable. All questions answered prior to discharge.   Glenice Bow, DO  02/07/2018, 2:08 PM

## 2018-02-07 NOTE — Progress Notes (Signed)
Subjective:  Lindsey Sellers is a 32 y.o. G1P0000 at [redacted]w[redacted]d being seen today for ongoing prenatal care.  She is currently monitored for the following issues for this low-risk pregnancy and has Supervision of normal first pregnancy, antepartum on their problem list.  Patient reports had episode of blurry vision yesterday. No HA or RUQ pain.  Contractions: Not present. Vag. Bleeding: None.  Movement: Present. Denies leaking of fluid.   The following portions of the patient's history were reviewed and updated as appropriate: allergies, current medications, past family history, past medical history, past social history, past surgical history and problem list. Problem list updated.  Objective:   Vitals:   02/07/18 0857 02/07/18 0934  BP: (!) 123/91 (!) 154/101  Pulse: 78 89  Weight: 79.4 kg     Fetal Status: Fetal Heart Rate (bpm): 141 Fundal Height: 36 cm Movement: Present  Presentation: Vertex  General:  Alert, oriented and cooperative. Patient is in no acute distress.  Skin: Skin is warm and dry. No rash noted.   Cardiovascular: Normal heart rate noted  Respiratory: Normal respiratory effort, no problems with respiration noted  Abdomen: Soft, gravid, appropriate for gestational age. Pain/Pressure: Absent     Pelvic: Vag. Bleeding: None Vag D/C Character: Thin   Cervical exam deferred        Extremities: Normal range of motion.  Edema: None  Mental Status: Normal mood and affect. Normal behavior. Normal judgment and thought content.   Urinalysis:      Assessment and Plan:  Pregnancy: G1P0000 at [redacted]w[redacted]d  1. Encounter for supervision of other normal pregnancy in third trimester - Culture, beta strep (group b only) - GC/Chlamydia probe amp ([place])not at [facility] - POC Urinalysis Dipstick OB  2. Hypertension affecting pregnancy in third trimester - elevated x2 today, blurry vision yesterday - to MAU for PEC eval  Preterm labor symptoms and general obstetric precautions including  but not limited to vaginal bleeding, contractions, leaking of fluid and fetal movement were reviewed in detail with the patient. Please refer to After Visit Summary for other counseling recommendations.  Return in about 1 week (around 02/14/2018).   Julianne Handler, CNM

## 2018-02-07 NOTE — Discharge Instructions (Signed)
·   Return to MAU over the weekend for the following reasons: BP > 160/110, headache that doesn't improve with normal measures like Tylenol/drinking water, spots in vision, abdominal pain  Check your blood pressure once a day or if you have symptoms, and keep track of these   If your blood pressure is still elevated on Monday, you will likely be admitted for induction of labor

## 2018-02-07 NOTE — MAU Note (Signed)
Urine sent to lab 

## 2018-02-07 NOTE — MAU Note (Signed)
Sent from office, rtn appt. BP elevated. Had a brief episode of visual blurring yesterday. Increased swelling in feet.  Denies HA or epigastric pain.

## 2018-02-10 ENCOUNTER — Ambulatory Visit: Payer: 59

## 2018-02-10 VITALS — BP 116/83 | HR 87

## 2018-02-10 DIAGNOSIS — Z0130 Encounter for examination of blood pressure without abnormal findings: Secondary | ICD-10-CM

## 2018-02-10 LAB — CULTURE, BETA STREP (GROUP B ONLY)
MICRO NUMBER: 91255386
SPECIMEN QUALITY:: ADEQUATE

## 2018-02-10 LAB — GC/CHLAMYDIA PROBE AMP (~~LOC~~) NOT AT ARMC
CHLAMYDIA, DNA PROBE: NEGATIVE
NEISSERIA GONORRHEA: NEGATIVE

## 2018-02-10 NOTE — Progress Notes (Addendum)
PT was seen on Friday for OB appt and then sent to MAU for high blood pressure. PT states over the weekend she had been checking her BP and it has been good. She denies any headaches or visual changes today. BP today 116/83. Told pt to call office with any changes in BP, headaches or visual changes. Pt expressed understanding. Pt will keep scheduled OB appt for 02/14/18.

## 2018-02-14 ENCOUNTER — Ambulatory Visit (INDEPENDENT_AMBULATORY_CARE_PROVIDER_SITE_OTHER): Payer: 59 | Admitting: Certified Nurse Midwife

## 2018-02-14 VITALS — BP 122/85 | HR 77 | Wt 178.0 lb

## 2018-02-14 DIAGNOSIS — Z3403 Encounter for supervision of normal first pregnancy, third trimester: Secondary | ICD-10-CM

## 2018-02-14 DIAGNOSIS — Z3400 Encounter for supervision of normal first pregnancy, unspecified trimester: Secondary | ICD-10-CM

## 2018-02-14 NOTE — Progress Notes (Signed)
Subjective:  Lindsey Sellers is a 32 y.o. G1P0000 at [redacted]w[redacted]d being seen today for ongoing prenatal care.  She is currently monitored for the following issues for this low-risk pregnancy and has Supervision of normal first pregnancy, antepartum on their problem list.  Patient reports no complaints.  Contractions: Irritability. Vag. Bleeding: None.  Movement: Present. Denies leaking of fluid. No visual disturbances or RUQ pain. Had HA few day ago, spontaneously resolved.  The following portions of the patient's history were reviewed and updated as appropriate: allergies, current medications, past family history, past medical history, past social history, past surgical history and problem list. Problem list updated.  Objective:   Vitals:   02/14/18 0918  BP: 122/85  Pulse: 77  Weight: 80.7 kg    Fetal Status: Fetal Heart Rate (bpm): 147 Fundal Height: 37 cm Movement: Present  Presentation: Vertex  General:  Alert, oriented and cooperative. Patient is in no acute distress.  Skin: Skin is warm and dry. No rash noted.   Cardiovascular: Normal heart rate noted  Respiratory: Normal respiratory effort, no problems with respiration noted  Abdomen: Soft, gravid, appropriate for gestational age. Pain/Pressure: Present     Pelvic: Vag. Bleeding: None Vag D/C Character: Thin   Cervical exam deferred        Extremities: Normal range of motion.  Edema: Trace  Mental Status: Normal mood and affect. Normal behavior. Normal judgment and thought content.   Urinalysis:      Assessment and Plan:  Pregnancy: G1P0000 at [redacted]w[redacted]d  1. Supervision of normal first pregnancy, antepartum  2. Transient HTN in pregnancy - elevated BP on 10/18, PEC neg, BP normal since - has cuff at home, notify with any elevations - PEC precautions  Term labor symptoms and general obstetric precautions including but not limited to vaginal bleeding, contractions, leaking of fluid and fetal movement were reviewed in detail with  the patient. Please refer to After Visit Summary for other counseling recommendations.  Return in about 1 week (around 02/21/2018).   Julianne Handler, CNM

## 2018-02-21 ENCOUNTER — Ambulatory Visit (INDEPENDENT_AMBULATORY_CARE_PROVIDER_SITE_OTHER): Payer: 59 | Admitting: Certified Nurse Midwife

## 2018-02-21 VITALS — BP 130/83 | HR 73 | Wt 177.0 lb

## 2018-02-21 DIAGNOSIS — Z3400 Encounter for supervision of normal first pregnancy, unspecified trimester: Secondary | ICD-10-CM

## 2018-02-21 NOTE — Progress Notes (Signed)
Subjective:  Lindsey Sellers is a 32 y.o. G1P0000 at [redacted]w[redacted]d being seen today for ongoing prenatal care.  She is currently monitored for the following issues for this low-risk pregnancy and has Supervision of normal first pregnancy, antepartum on their problem list.  Patient reports no complaints.  Contractions: Irritability. Vag. Bleeding: None.  Movement: Present. Denies leaking of fluid.   The following portions of the patient's history were reviewed and updated as appropriate: allergies, current medications, past family history, past medical history, past social history, past surgical history and problem list. Problem list updated.  Objective:   Vitals:   02/21/18 1013  BP: 130/83  Pulse: 73  Weight: 80.3 kg    Fetal Status: Fetal Heart Rate (bpm): 144 Fundal Height: 38 cm Movement: Present  Presentation: Vertex  General:  Alert, oriented and cooperative. Patient is in no acute distress.  Skin: Skin is warm and dry. No rash noted.   Cardiovascular: Normal heart rate noted  Respiratory: Normal respiratory effort, no problems with respiration noted  Abdomen: Soft, gravid, appropriate for gestational age. Pain/Pressure: Present     Pelvic: Vag. Bleeding: None Vag D/C Character: Thick   Cervical exam deferred        Extremities: Normal range of motion.  Edema: Trace  Mental Status: Normal mood and affect. Normal behavior. Normal judgment and thought content.   Urinalysis:      Assessment and Plan:  Pregnancy: G1P0000 at [redacted]w[redacted]d  1. Supervision of normal first pregnancy - had elevated BP few weeks ago, none since - no PEC sx - continue spot checks at home, notify for elevations  Term labor symptoms and general obstetric precautions including but not limited to vaginal bleeding, contractions, leaking of fluid and fetal movement were reviewed in detail with the patient. Please refer to After Visit Summary for other counseling recommendations.  Return in about 1 week (around  02/28/2018).   Julianne Handler, CNM

## 2018-02-28 ENCOUNTER — Ambulatory Visit (INDEPENDENT_AMBULATORY_CARE_PROVIDER_SITE_OTHER): Payer: 59 | Admitting: Student

## 2018-02-28 DIAGNOSIS — Z3400 Encounter for supervision of normal first pregnancy, unspecified trimester: Secondary | ICD-10-CM

## 2018-02-28 DIAGNOSIS — Z3403 Encounter for supervision of normal first pregnancy, third trimester: Secondary | ICD-10-CM

## 2018-02-28 NOTE — Patient Instructions (Signed)
Labor Induction Labor induction is when steps are taken to cause a pregnant woman to begin the labor process. Most women go into labor on their own between 37 weeks and 42 weeks of the pregnancy. When this does not happen or when there is a medical need, methods may be used to induce labor. Labor induction causes a pregnant woman's uterus to contract. It also causes the cervix to soften (ripen), open (dilate), and thin out (efface). Usually, labor is not induced before 39 weeks of the pregnancy unless there is a problem with the baby or mother. Before inducing labor, your health care provider will consider a number of factors, including the following:  The medical condition of you and the baby.  How many weeks along you are.  The status of the baby's lung maturity.  The condition of the cervix.  The position of the baby. What are the reasons for labor induction? Labor may be induced for the following reasons:  The health of the baby or mother is at risk.  The pregnancy is overdue by 1 week or more.  The water breaks but labor does not start on its own.  The mother has a health condition or serious illness, such as high blood pressure, infection, placental abruption, or diabetes.  The amniotic fluid amounts are low around the baby.  The baby is distressed. Convenience or wanting the baby to be born on a certain date is not a reason for inducing labor. What methods are used for labor induction? Several methods of labor induction may be used, such as:  Prostaglandin medicine. This medicine causes the cervix to dilate and ripen. The medicine will also start contractions. It can be taken by mouth or by inserting a suppository into the vagina.  Inserting a thin tube (catheter) with a balloon on the end into the vagina to dilate the cervix. Once inserted, the balloon is expanded with water, which causes the cervix to open.  Stripping the membranes. Your health care provider separates  amniotic sac tissue from the cervix, causing the cervix to be stretched and causing the release of a hormone called progesterone. This may cause the uterus to contract. It is often done during an office visit. You will be sent home to wait for the contractions to begin. You will then come in for an induction.  Breaking the water. Your health care provider makes a hole in the amniotic sac using a small instrument. Once the amniotic sac breaks, contractions should begin. This may still take hours to see an effect.  Medicine to trigger or strengthen contractions. This medicine is given through an IV access tube inserted into a vein in your arm. All of the methods of induction, besides stripping the membranes, will be done in the hospital. Induction is done in the hospital so that you and the baby can be carefully monitored. How long does it take for labor to be induced? Some inductions can take up to 2-3 days. Depending on the cervix, it usually takes less time. It takes longer when you are induced early in the pregnancy or if this is your first pregnancy. If a mother is still pregnant and the induction has been going on for 2-3 days, either the mother will be sent home or a cesarean delivery will be needed. What are the risks associated with labor induction? Some of the risks of induction include:  Changes in fetal heart rate, such as too high, too low, or erratic.  Fetal distress.    Chance of infection for the mother and baby.  Increased chance of having a cesarean delivery.  Breaking off (abruption) of the placenta from the uterus (rare).  Uterine rupture (very rare). When induction is needed for medical reasons, the benefits of induction may outweigh the risks. What are some reasons for not inducing labor? Labor induction should not be done if:  It is shown that your baby does not tolerate labor.  You have had previous surgeries on your uterus, such as a myomectomy or the removal of  fibroids.  Your placenta lies very low in the uterus and blocks the opening of the cervix (placenta previa).  Your baby is not in a head-down position.  The umbilical cord drops down into the birth canal in front of the baby. This could cut off the baby's blood and oxygen supply.  You have had a previous cesarean delivery.  There are unusual circumstances, such as the baby being extremely premature. This information is not intended to replace advice given to you by your health care provider. Make sure you discuss any questions you have with your health care provider. Document Released: 08/29/2006 Document Revised: 09/15/2015 Document Reviewed: 11/06/2012 Elsevier Interactive Patient Education  2017 [employer]  

## 2018-02-28 NOTE — Progress Notes (Signed)
   PRENATAL VISIT NOTE  Subjective:  Lindsey Sellers is a 32 y.o. G1P0000 at [redacted]w[redacted]d being seen today for ongoing prenatal care.  She is currently monitored for the following issues for this low-risk pregnancy and has Supervision of normal first pregnancy, antepartum on their problem list.  Patient reports pelvic pressure and braxton hicks; baby presses down at night and causes her to leak urine  but no leaking of amniotic fluid. .  Contractions: Irritability. Vag. Bleeding: None.  Movement: Present. Denies leaking of fluid.   The following portions of the patient's history were reviewed and updated as appropriate: allergies, current medications, past family history, past medical history, past social history, past surgical history and problem list. Problem list updated.  Objective:   Vitals:   02/28/18 0831  BP: 112/82  Pulse: 81  Weight: 178 lb (80.7 kg)    Fetal Status: Fetal Heart Rate (bpm): 146 Fundal Height: 39 cm Movement: Present     General:  Alert, oriented and cooperative. Patient is in no acute distress.  Skin: Skin is warm and dry. No rash noted.   Cardiovascular: Normal heart rate noted  Respiratory: Normal respiratory effort, no problems with respiration noted  Abdomen: Soft, gravid, appropriate for gestational age.  Pain/Pressure: Present     Pelvic: Cervical exam performed Dilation: 2 Effacement (%): 50 Station: -2  Extremities: Normal range of motion.  Edema: Trace  Mental Status: Normal mood and affect. Normal behavior. Normal judgment and thought content.   Assessment and Plan:  Pregnancy: G1P0000 at [redacted]w[redacted]d  1. Supervision of normal first pregnancy, antepartum -Doing well; BPs have been normal at home.  -IOL scheduled for 11/19; will have appt next Wed or Thursday for LROB and NST. Patient is a little unsure about induction, but agrees that if it is necessary, she will be induced at 41 weeks. Plan to discuss this more at her appt  next week. If induction plan  stays the same (11/19 at 41 weeks), she is agreeable to coming in on 11/18 for FB placement.   Preterm labor symptoms and general obstetric precautions including but not limited to vaginal bleeding, contractions, leaking of fluid and fetal movement were reviewed in detail with the patient. Please refer to After Visit Summary for other counseling recommendations.  Return next wed or thurs for NST and LROB.  Future Appointments  Date Time Provider [place]  03/04/2018  1:45 PM Lajean Manes, CNM [facility]    Mervyn Skeeters [place], [place]

## 2018-03-01 ENCOUNTER — Other Ambulatory Visit: Payer: Self-pay | Admitting: Student

## 2018-03-03 ENCOUNTER — Telehealth (HOSPITAL_COMMUNITY): Payer: Self-pay | Admitting: *Deleted

## 2018-03-03 NOTE — Telephone Encounter (Signed)
Preadmission screen  

## 2018-03-04 ENCOUNTER — Inpatient Hospital Stay (HOSPITAL_COMMUNITY)
Admission: AD | Admit: 2018-03-04 | Discharge: 2018-03-07 | DRG: 768 | Disposition: A | Discharge status: Home / Self Care | Payer: 59 | Attending: Family Medicine | Admitting: Family Medicine

## 2018-03-04 ENCOUNTER — Encounter: Payer: Self-pay | Admitting: Certified Nurse Midwife

## 2018-03-04 ENCOUNTER — Ambulatory Visit (INDEPENDENT_AMBULATORY_CARE_PROVIDER_SITE_OTHER): Payer: 59 | Admitting: Certified Nurse Midwife

## 2018-03-04 VITALS — BP 127/73 | HR 83 | Wt 178.0 lb

## 2018-03-04 DIAGNOSIS — Z3403 Encounter for supervision of normal first pregnancy, third trimester: Secondary | ICD-10-CM

## 2018-03-04 DIAGNOSIS — Z3A40 40 weeks gestation of pregnancy: Secondary | ICD-10-CM

## 2018-03-04 DIAGNOSIS — O480 Post-term pregnancy: Secondary | ICD-10-CM | POA: Diagnosis not present

## 2018-03-04 DIAGNOSIS — O9081 Anemia of the puerperium: Secondary | ICD-10-CM | POA: Diagnosis not present

## 2018-03-04 DIAGNOSIS — D62 Acute posthemorrhagic anemia: Secondary | ICD-10-CM | POA: Diagnosis not present

## 2018-03-04 DIAGNOSIS — Z3400 Encounter for supervision of normal first pregnancy, unspecified trimester: Secondary | ICD-10-CM

## 2018-03-04 DIAGNOSIS — Z3483 Encounter for supervision of other normal pregnancy, third trimester: Secondary | ICD-10-CM | POA: Diagnosis present

## 2018-03-04 DIAGNOSIS — O4693 Antepartum hemorrhage, unspecified, third trimester: Secondary | ICD-10-CM

## 2018-03-04 LAB — CBC
HEMATOCRIT: 39.4 % (ref 36.0–46.0)
HEMOGLOBIN: 13.3 g/dL (ref 12.0–15.0)
MCH: 32.4 pg (ref 26.0–34.0)
MCHC: 33.8 g/dL (ref 30.0–36.0)
MCV: 95.9 fL (ref 80.0–100.0)
NRBC: 0 % (ref 0.0–0.2)
Platelets: 194 10*3/uL (ref 150–400)
RBC: 4.11 MIL/uL (ref 3.87–5.11)
RDW: 12.6 % (ref 11.5–15.5)
WBC: 10.7 10*3/uL — ABNORMAL HIGH (ref 4.0–10.5)

## 2018-03-04 LAB — ABO/RH: ABO/RH(D): A POS

## 2018-03-04 MED ORDER — LACTATED RINGERS IV SOLN: 500.0000 mL | INTRAVENOUS | Status: DC | PRN

## 2018-03-04 MED ORDER — FENTANYL CITRATE (PF) 100 MCG/2ML IJ SOLN
INTRAMUSCULAR | Status: AC
  Filled 2018-03-04: qty 2

## 2018-03-04 MED ORDER — LIDOCAINE HCL (PF) 1 % IJ SOLN
30.0000 mL | INTRAMUSCULAR | Status: AC | PRN
  Administered 2018-03-05: 30 mL via SUBCUTANEOUS
  Filled 2018-03-04: qty 30

## 2018-03-04 MED ORDER — LACTATED RINGERS IV SOLN
INTRAVENOUS | Status: DC
  Administered 2018-03-04: 125 mL/h via INTRAVENOUS

## 2018-03-04 MED ORDER — ACETAMINOPHEN 325 MG PO TABS: 650.0000 mg | ORAL_TABLET | ORAL | Status: DC | PRN

## 2018-03-04 MED ORDER — OXYCODONE-ACETAMINOPHEN 5-325 MG PO TABS: 1.0000 | ORAL_TABLET | ORAL | Status: DC | PRN

## 2018-03-04 MED ORDER — ONDANSETRON HCL 4 MG/2ML IJ SOLN: 4.0000 mg | Freq: Four times a day (QID) | INTRAMUSCULAR | Status: DC | PRN

## 2018-03-04 MED ORDER — OXYCODONE-ACETAMINOPHEN 5-325 MG PO TABS
2.0000 | ORAL_TABLET | ORAL | Status: DC | PRN
  Administered 2018-03-05: 2 via ORAL
  Filled 2018-03-04: qty 2

## 2018-03-04 MED ORDER — SOD CITRATE-CITRIC ACID 500-334 MG/5ML PO SOLN: 30.0000 mL | ORAL | Status: DC | PRN

## 2018-03-04 MED ORDER — MISOPROSTOL 25 MCG QUARTER TABLET
25.0000 ug | ORAL_TABLET | ORAL | Status: DC | PRN
  Filled 2018-03-04: qty 1

## 2018-03-04 MED ORDER — TERBUTALINE SULFATE 1 MG/ML IJ SOLN
0.2500 mg | Freq: Once | INTRAMUSCULAR | Status: DC | PRN
  Filled 2018-03-04: qty 1

## 2018-03-04 MED ORDER — OXYTOCIN BOLUS FROM INFUSION
500.0000 mL | Freq: Once | INTRAVENOUS | Status: AC
  Administered 2018-03-05: 500 mL via INTRAVENOUS

## 2018-03-04 MED ORDER — OXYTOCIN 40 UNITS IN LACTATED RINGERS INFUSION - SIMPLE MED
2.5000 [IU]/h | INTRAVENOUS | Status: DC
  Filled 2018-03-04: qty 1000

## 2018-03-04 MED ORDER — FENTANYL CITRATE (PF) 100 MCG/2ML IJ SOLN
50.0000 ug | INTRAMUSCULAR | Status: DC | PRN
  Administered 2018-03-04 (×2): 50 ug via INTRAVENOUS
  Filled 2018-03-04: qty 2

## 2018-03-04 NOTE — Anesthesia Pain Management Evaluation Note (Signed)
  [facility] Pain Management Visit Note  Patient: Lindsey Sellers, 32 y.o., female  "Hello I am a member of the anesthesia team at [facility]. We have an anesthesia team available at all times to provide care throughout the hospital, including epidural management and anesthesia for C-section. I don't know your plan for the delivery whether it a natural birth, water birth, IV sedation, nitrous supplementation, doula or epidural, but we want to meet your pain goals."   1.Was your pain managed to your expectations on prior hospitalizations?   No   2.What is your expectation for pain management during this hospitalization?     Labor support without medications  3.How can we help you reach that goal? unsure  Record the patient's initial score and the patient's pain goal.   Pain: 7  Pain Goal: 9 [facility] wants you to be able to say your pain was always managed very well.  Casimer Lanius 03/04/2018

## 2018-03-04 NOTE — Progress Notes (Signed)
   PRENATAL VISIT NOTE  Subjective:  Lindsey Sellers is a 32 y.o. G1P0000 at [redacted]w[redacted]d being seen today for ongoing prenatal care.  She is currently monitored for the following issues for this low-risk pregnancy and has Supervision of normal first pregnancy, antepartum on their problem list.  Patient reports vaginal bleeding.  Contractions: Irritability. Vag. Bleeding: Small.  Movement: Present. Denies leaking of fluid.   The following portions of the patient's history were reviewed and updated as appropriate: allergies, current medications, past family history, past medical history, past social history, past surgical history and problem list. Problem list updated.  Objective:   Vitals:   03/04/18 1049  BP: 127/73  Pulse: 83  Weight: 178 lb (80.7 kg)    Fetal Status: Fetal Heart Rate (bpm): NST-R Fundal Height: 35 cm Movement: Present  Presentation: Vertex  General:  Alert, oriented and cooperative. Patient is in no acute distress.  Skin: Skin is warm and dry. No rash noted.   Cardiovascular: Normal heart rate noted  Respiratory: Normal respiratory effort, no problems with respiration noted  Abdomen: Soft, gravid, appropriate for gestational age.  Pain/Pressure: Present     Pelvic: Cervical exam performed Dilation: 5 Effacement (%): 90 Station: -2  Extremities: Normal range of motion.  Edema: Trace  Mental Status: Normal mood and affect. Normal behavior. Normal judgment and thought content.   Assessment and Plan:  Pregnancy: G1P0000 at [redacted]w[redacted]d  1. Supervision of normal first pregnancy, antepartum - Patient reports vaginal bleeding that started this morning, vaginal bleeding is associated with occasional contractions.   2. Vaginal bleeding in pregnancy, third trimester - Reports vaginal bleeding started this morning around 0800- woke up to bright red vaginal bleeding when she wiped, bleeding stopped then restarted around 0930. When bleeding restarted was dark reddish brown bleeding  where she needed to wear a panty liner.  - She denies having to change panty liner or seeing clots - Small amount of dark reddish brown bleeding around cervix, no clots of bright red bleeding noted  - Cervical examination noted 5cm/90/-2   Patient sent to L&D for direct admission- report called to [facility]   Return in about 4 weeks (around 04/01/2018) for POSTPARTUM.  Future Appointments  Date Time Provider [place]  03/11/2018  7:30 AM [facility] RO[facility] None  04/01/2018 11:00 AM Leftwich-Kirby, Kathie Dike, CNM [facility]    Lajean Manes, CNM

## 2018-03-04 NOTE — H&P (Signed)
OBSTETRIC ADMISSION HISTORY AND PHYSICAL  Lindsey Sellers is a 32 y.o. female G1P0000 with IUP at [redacted]w[redacted]d by LMP presenting for spontaneous onset of labor. She reports +FMs, No LOF, no blurry vision, headaches or peripheral edema, and RUQ pain.  She plans on breast feeding. She request POP for birth control. She received her prenatal care at [facility]  Dating: By LMP --->  Estimated Date of Delivery: 03/03/18  Nursing Staff Provider  Office Location  [facility] Dating  LMP 05/27/17  Language  English Anatomy US  normal  Flu Vaccine  01/08/18@[facility]  Genetic Screen  NIPS: NML         AFP:     TDaP vaccine   12/13/17 Hgb A1C or  GTT  Third trimester: nml  Rhogam     LAB RESULTS   Feeding Plan  Breast Blood Type A/RH(D) POSITIVE/-- (04/23 1440)   Contraception  POP Antibody NO ANTIBODIES DETECTED (04/23 1440)  Circumcision Yes Rubella 2.23 (04/23 1440)  Pediatrician  [facility] RPR NON-REACTIVE (08/23 0817)   Support Person Ryan HBsAg NON-REACTIVE (04/23 1440)   Prenatal Classes yes HIV NON-REACTIVE (08/23 0817)  BTL Consent  GBS  (For PCN allergy, check sensitivities)   VBAC Consent  Pap 08/08/16 NML  BRX opt sched  Hgb Electro      CF  negative    SMA     Waterbirth  [ ]  Class [ ]  Consent [ ]  CNM visit   Prenatal History/Complications:  Past Medical History: Past Medical History:  Diagnosis Date  . Abnormal pap 04/2006  . 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 Medical history non-contributory     Past Surgical History: Past Surgical History:  Procedure Laterality Date  . COLPOSCOPY  2008   no biopsy done  . WISDOM TOOTH EXTRACTION      Obstetrical History: OB History    Gravida  1   Para  0   Term  0   Preterm  0   AB  0   Living  0     SAB  0   TAB  0   Ectopic  0   Multiple  0   Live Births              Social History: Social History   Socioeconomic History  . Marital status: Married    Spouse name:  Not on file  . Number of children: Not on file  . Years of education: Not on file  . Highest education level: Not on file  Occupational History  . Not on file  Social Needs  . Financial resource strain: Not on file  . Food insecurity:    Worry: Not on file    Inability: Not on file  . Transportation needs:    Medical: Not on file    Non-medical: Not on file  Tobacco Use  . Smoking status: Never Smoker  . Smokeless tobacco: Never Used  Substance and Sexual Activity  . Alcohol use: Not Currently    Alcohol/week: 5.0 standard drinks    Types: 2 Glasses of wine, 3 Standard drinks or equivalent per week  . Drug use: No  . Sexual activity: Yes    Partners: Male  Lifestyle  . Physical activity:    Days per week: Not on file    Minutes per session: Not on file  . Stress: Not on file  Relationships  . Social connections:    Talks  on phone: Not on file    Gets together: Not on file    Attends religious service: Not on file    Active member of club or organization: Not on file    Attends meetings of clubs or organizations: Not on file    Relationship status: Not on file  Other Topics Concern  . 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 Cancer Paternal Grandmother        lymph node  . Gout Father   . Colon cancer Neg Hx   . Breast cancer Neg Hx     Allergies: No Known Allergies  Medications Prior to Admission  Medication Sig Dispense Refill Last Dose  . Prenatal Vit-Fe Fumarate-FA (PRENATAL VITAMIN PO) Take by mouth.   03/03/2018 at Unknown time   Review of Systems   All systems reviewed and negative except as stated in HPI  Blood pressure (!) 139/93, pulse (!) 103, temperature 98.1 F (36.7 C), temperature  source Oral, resp. rate 16, height 5\' 4"  (1.626 m), weight 81.7 kg, last menstrual period 05/27/2017. General appearance: alert, cooperative and no distress Lungs: clear to auscultation bilaterally Heart: regular rate and rhythm Abdomen: soft, non-tender; bowel sounds normal Pelvic: n/a Extremities: Homans sign is negative, no sign of DVT DTR's +2 Presentation: cephalic Fetal monitoringBaseline: 135 bpm, Variability: Good {> 6 bpm), Accelerations: Reactive and Decelerations: Absent Uterine activityFrequency: Every 3-5 minutes Dilation: 5 Effacement (%): 80 Station: -2 Exam by:: CNM Neill  Prenatal labs: ABO, Rh: A/RH(D) POSITIVE/-- (04/23 1440) Antibody: NO ANTIBODIES DETECTED (04/23 1440) Rubella: 2.23 (04/23 1440) RPR: NON-REACTIVE (08/23 0817)  HBsAg: NON-REACTIVE (04/23 1440)  HIV: NON-REACTIVE (08/23 0817)  GBS: Negative (10/18 0000)   Prenatal Transfer Tool  Maternal Diabetes: No Genetic Screening: Normal Maternal Ultrasounds/Referrals: Normal Fetal Ultrasounds or other Referrals:  None Maternal Substance Abuse:  No Significant Maternal Medications:  None Significant Maternal Lab Results: Lab values include: Group B Strep negative  Results for orders placed or performed during the hospital encounter of 03/04/18 (from the past 24 hour(s))  CBC   Collection Time: 03/04/18  2:25 PM  Result Value Ref Range   WBC 10.7 (H) 4.0 - 10.5 K/uL   RBC 4.11 3.87 - 5.11 MIL/uL   Hemoglobin 13.3 12.0 - 15.0 g/dL   HCT 39.4 36.0 - 46.0 %   MCV 95.9 80.0 - 100.0 fL   MCH 32.4 26.0 - 34.0 pg   MCHC 33.8 30.0 - 36.0 g/dL   RDW 12.6 11.5 - 15.5 %   Platelets 194 150 - 400 K/uL   nRBC 0.0 0.0 - 0.2 %    Patient Active Problem List   Diagnosis Date Noted  . Indication for care in labor and delivery, antepartum 03/04/2018  . Supervision of normal first pregnancy, antepartum 08/13/2017    Assessment/Plan:  Lindsey Sellers is a 32 y.o. G1P0000 at [redacted]w[redacted]d here for spontaneous  onset of labor and vaginal bleeding.   #Labor: Patient desires expectant management. Offered to strip patient's membranes and patient agreeable to plan of care. Patient plans to ambulate and use birthing ball. Recheck cervix in 2 hours. #Pain: Per pateint request. Plans  nitrous #FWB: Cat 1 #ID:  GBS neg #MOF: Breast #MOC: POP #Circ:  Yes, inpatient  Wende Mott, CNM  03/04/2018, 3:15 PM

## 2018-03-04 NOTE — Progress Notes (Signed)
LABOR PROGRESS NOTE  Lindsey Sellers is a 32 y.o. G1P0000 at [redacted]w[redacted]d  admitted for SOL  Subjective: Patient doing well. Family at bedside. Denies HA, vision changes, RUQ pain, edema. AROM with moderate meconium  Objective: BP (!) 140/91 (BP Location: Right Arm)   Pulse 97   Temp 97.9 F (36.6 C) (Oral)   Resp 18   Ht 5\' 4"  (1.626 m)   Wt 81.7 kg   LMP 05/27/2017   BMI 30.91 kg/m  or  Vitals:   03/04/18 1413 03/04/18 1603 03/04/18 1812 03/04/18 1923  BP: (!) 139/93 137/89 (!) 140/91   Pulse: (!) 103 90 97   Resp: 16 16 18    Temp: 98.1 F (36.7 C)  98.1 F (36.7 C) 97.9 F (36.6 C)  TempSrc: Oral  Oral Oral  Weight: 81.7 kg     Height: 5\' 4"  (1.626 m)       Dilation: 6.5 Effacement (%): 80 Station: -1 Presentation: Vertex Exam by:: Dr Sanda Klein: baseline rate 150, moderate varibility, +acel, =decel Toco: q73min  Labs: Lab Results  Component Value Date   WBC 10.7 (H) 03/04/2018   HGB 13.3 03/04/2018   HCT 39.4 03/04/2018   MCV 95.9 03/04/2018   PLT 194 03/04/2018    Patient Active Problem List   Diagnosis Date Noted  . Indication for care in labor and delivery, antepartum 03/04/2018  . Supervision of normal first pregnancy, antepartum 08/13/2017    Assessment / Plan: 32 y.o. G1P0000 at [redacted]w[redacted]d here for SOL  Labor: AROM Fetal Wellbeing:  Cat 1 Pain Control:  Per patient request Anticipated MOD:  SVD   Caroline More, DO PGY-2 03/04/2018, 7:48 PM

## 2018-03-05 ENCOUNTER — Encounter (HOSPITAL_COMMUNITY): Payer: Self-pay

## 2018-03-05 DIAGNOSIS — O480 Post-term pregnancy: Secondary | ICD-10-CM

## 2018-03-05 DIAGNOSIS — Z3A40 40 weeks gestation of pregnancy: Secondary | ICD-10-CM

## 2018-03-05 HISTORY — PX: OTHER SURGICAL HISTORY: SHX169

## 2018-03-05 LAB — CBC
HCT: 30.3 % — ABNORMAL LOW (ref 36.0–46.0)
Hemoglobin: 10.4 g/dL — ABNORMAL LOW (ref 12.0–15.0)
MCH: 33.1 pg (ref 26.0–34.0)
MCHC: 34.7 g/dL (ref 30.0–36.0)
MCV: 95.6 fL (ref 80.0–100.0)
NRBC: 0 % (ref 0.0–0.2)
Platelets: 229 10*3/uL (ref 150–400)
RBC: 3.17 MIL/uL — AB (ref 3.87–5.11)
RDW: 12.7 % (ref 11.5–15.5)
WBC: 27.7 10*3/uL — ABNORMAL HIGH (ref 4.0–10.5)

## 2018-03-05 LAB — RPR: RPR: NONREACTIVE

## 2018-03-05 MED ORDER — TRANEXAMIC ACID-NACL 1000-0.7 MG/100ML-% IV SOLN
1000.0000 mg | INTRAVENOUS | Status: AC
  Administered 2018-03-05: 1000 mg via INTRAVENOUS
  Filled 2018-03-05: qty 100

## 2018-03-05 MED ORDER — METHYLERGONOVINE MALEATE 0.2 MG/ML IJ SOLN
INTRAMUSCULAR | Status: AC
  Filled 2018-03-05: qty 1

## 2018-03-05 MED ORDER — TETANUS-DIPHTH-ACELL PERTUSSIS 5-2.5-18.5 LF-MCG/0.5 IM SUSP: 0.5000 mL | Freq: Once | INTRAMUSCULAR | Status: DC

## 2018-03-05 MED ORDER — IBUPROFEN 600 MG PO TABS
600.0000 mg | ORAL_TABLET | Freq: Four times a day (QID) | ORAL | Status: DC
  Administered 2018-03-05 – 2018-03-07 (×8): 600 mg via ORAL
  Filled 2018-03-05 (×7): qty 1

## 2018-03-05 MED ORDER — METHYLERGONOVINE MALEATE 0.2 MG/ML IJ SOLN
0.2000 mg | Freq: Once | INTRAMUSCULAR | Status: AC
  Administered 2018-03-05: 0.2 mg via INTRAMUSCULAR

## 2018-03-05 MED ORDER — ONDANSETRON HCL 4 MG/2ML IJ SOLN: 4.0000 mg | INTRAMUSCULAR | Status: DC | PRN

## 2018-03-05 MED ORDER — ZOLPIDEM TARTRATE 5 MG PO TABS: 5.0000 mg | ORAL_TABLET | Freq: Every evening | ORAL | Status: DC | PRN

## 2018-03-05 MED ORDER — COCONUT OIL OIL
1.0000 "application " | TOPICAL_OIL | Status: DC | PRN
  Filled 2018-03-05: qty 120

## 2018-03-05 MED ORDER — SIMETHICONE 80 MG PO CHEW: 80.0000 mg | CHEWABLE_TABLET | ORAL | Status: DC | PRN

## 2018-03-05 MED ORDER — SODIUM CHLORIDE 0.9 % IV BOLUS: 1000.0000 mL | Freq: Once | INTRAVENOUS | Status: DC

## 2018-03-05 MED ORDER — PRENATAL MULTIVITAMIN CH
1.0000 | ORAL_TABLET | Freq: Every day | ORAL | Status: DC
  Administered 2018-03-05 – 2018-03-07 (×3): 1 via ORAL
  Filled 2018-03-05 (×2): qty 1

## 2018-03-05 MED ORDER — LACTATED RINGERS IV SOLN
INTRAVENOUS | Status: DC
  Administered 2018-03-05: 1000 mL via INTRAVENOUS

## 2018-03-05 MED ORDER — DIPHENHYDRAMINE HCL 25 MG PO CAPS
25.0000 mg | ORAL_CAPSULE | Freq: Four times a day (QID) | ORAL | Status: DC | PRN
  Administered 2018-03-06: 25 mg via ORAL
  Filled 2018-03-05: qty 1

## 2018-03-05 MED ORDER — BENZOCAINE-MENTHOL 20-0.5 % EX AERO
1.0000 "application " | INHALATION_SPRAY | CUTANEOUS | Status: DC | PRN
  Filled 2018-03-05 (×2): qty 56

## 2018-03-05 MED ORDER — DIBUCAINE 1 % RE OINT: 1.0000 "application " | TOPICAL_OINTMENT | RECTAL | Status: DC | PRN

## 2018-03-05 MED ORDER — ACETAMINOPHEN 325 MG PO TABS
650.0000 mg | ORAL_TABLET | ORAL | Status: DC | PRN
  Administered 2018-03-06: 650 mg via ORAL
  Filled 2018-03-05: qty 2

## 2018-03-05 MED ORDER — POLYETHYLENE GLYCOL 3350 17 G PO PACK
17.0000 g | PACK | Freq: Every day | ORAL | Status: DC
  Administered 2018-03-06: 17 g via ORAL
  Filled 2018-03-05 (×3): qty 1

## 2018-03-05 MED ORDER — ONDANSETRON HCL 4 MG PO TABS: 4.0000 mg | ORAL_TABLET | ORAL | Status: DC | PRN

## 2018-03-05 MED ORDER — SENNOSIDES-DOCUSATE SODIUM 8.6-50 MG PO TABS
2.0000 | ORAL_TABLET | ORAL | Status: DC
  Administered 2018-03-06 (×2): 2 via ORAL
  Filled 2018-03-05 (×2): qty 2

## 2018-03-05 MED ORDER — WITCH HAZEL-GLYCERIN EX PADS: 1.0000 "application " | MEDICATED_PAD | CUTANEOUS | Status: DC | PRN

## 2018-03-05 NOTE — Progress Notes (Signed)
Attempted to get mother up to BR after eating.  Orthostatic BP done, with HR increase from 94 lying to 150 standing.  Pt able to sit on side of the bed but felt lightheaded while moving to BR.  Pt back t bed MD notified.

## 2018-03-05 NOTE — Progress Notes (Signed)
Patient attempted to get up to bathroom at 0445. Patient felt faint, got back into bed, food and juice given. Attempted again at [place], Patient passed out. DO notified, 1,027ml bolus of LR given. Pulse elevated, all other VSS.   Patient attempted to use bedpan from [id number]. Attempted to get patient up to bathroom with stedy at 0615. Patient passed out on toilet, RNs got patient back into bed and performed an I&O catheterization to drain patient's bladder. DO notified and came to bedside to evaluate.   Ola Spurr, RN

## 2018-03-05 NOTE — Progress Notes (Signed)
OB/GYN Faculty Practice: Labor Progress Note  Subjective: Very uncomfortable during contractions.   Objective: BP 139/77   Pulse 86   Temp 97.7 F (36.5 C) (Axillary)   Resp 20   Ht 5\' 4"  (1.626 m)   Wt 81.7 kg   LMP 05/27/2017   BMI 30.91 kg/m  Gen: moderate distress Dilation: 10 Dilation Complete Date: 03/05/18 Dilation Complete Time: 0027 Effacement (%): 100 Cervical Position: Middle Station: Plus 1 Presentation: Vertex Exam by:: Ola Spurr, RN  Assessment and Plan: 32 y.o. G1P0000 [redacted]w[redacted]d admitted for SOL  Labor: complete, allowing to labor down  -- pain control: nitrous gas  Fetal Well-Being: Cephalic -- Category 1 - continuous fetal monitoring  -- GBS negative  Orlene Plum, MD Family Medicine Resident 12:40 AM

## 2018-03-05 NOTE — Lactation Note (Signed)
This note was copied from a baby's chart. Lactation Consultation Note  Patient Name: Lindsey Sellers [id number] Date: 03/05/2018 Reason for consult: Initial assessment;Primapara;1st time breastfeeding  Visited with P26 Mom of term baby at 64 hrs old.  Mom was faint and having difficulty in L&D, and was observed for longer, giving IV bolus etc.  Hgb 10.3 (decrease from 13.8 pre-delivery)  Mom resting in bed, with baby in laid back cradle position, with a deep latch to breast.  Baby fed for 30 mins on left breast, and then latched again on right side.  Encouraged keeping baby STS, and watching for feeding cues.  Mom understands the goal is >8 feedings per 24 hrs.    Lactation brochure given to Mom.  Mom aware of IP and OP lactation support available.    Mom a [employer] Employee, UMR pump given to her.  Maternal Data Formula Feeding for Exclusion: No Has patient been taught Hand Expression?: Yes Does the patient have breastfeeding experience prior to this delivery?: No  Feeding Feeding Type: Breast Fed  LATCH Score Latch: Grasps breast easily, tongue down, lips flanged, rhythmical sucking.  Audible Swallowing: Spontaneous and intermittent  Type of Nipple: Everted at rest and after stimulation  Comfort (Breast/Nipple): Soft / non-tender  Hold (Positioning): Assistance needed to correctly position infant at breast and maintain latch.  LATCH Score: 9  Interventions Interventions: Breast feeding basics reviewed;Assisted with latch;Skin to skin;Breast massage;Hand express;Breast compression;Adjust position;Support pillows;Position options  Lactation Tools Discussed/Used WIC Program: No   Consult Status Consult Status: Follow-up Date: 03/06/18 Follow-up type: In-patient    Broadus John 03/05/2018, 12:16 PM

## 2018-03-06 LAB — CBC
HCT: 18.9 % — ABNORMAL LOW (ref 36.0–46.0)
Hemoglobin: 6.4 g/dL — CL (ref 12.0–15.0)
MCH: 33 pg (ref 26.0–34.0)
MCHC: 33.9 g/dL (ref 30.0–36.0)
MCV: 97.4 fL (ref 80.0–100.0)
NRBC: 0 % (ref 0.0–0.2)
PLATELETS: 151 10*3/uL (ref 150–400)
RBC: 1.94 MIL/uL — ABNORMAL LOW (ref 3.87–5.11)
RDW: 13.6 % (ref 11.5–15.5)
WBC: 15.9 10*3/uL — AB (ref 4.0–10.5)

## 2018-03-06 LAB — CBC WITH DIFFERENTIAL/PLATELET
BASOS ABS: 0 10*3/uL (ref 0.0–0.1)
BASOS PCT: 0 %
EOS ABS: 0.1 10*3/uL (ref 0.0–0.5)
EOS PCT: 0 %
HCT: 24.3 % — ABNORMAL LOW (ref 36.0–46.0)
Hemoglobin: 8.4 g/dL — ABNORMAL LOW (ref 12.0–15.0)
LYMPHS PCT: 20 %
Lymphs Abs: 2.8 10*3/uL (ref 0.7–4.0)
MCH: 32.6 pg (ref 26.0–34.0)
MCHC: 34.6 g/dL (ref 30.0–36.0)
MCV: 94.2 fL (ref 80.0–100.0)
MONO ABS: 0.4 10*3/uL (ref 0.1–1.0)
Monocytes Relative: 3 %
NRBC: 0 % (ref 0.0–0.2)
Neutro Abs: 10.9 10*3/uL — ABNORMAL HIGH (ref 1.7–7.7)
Neutrophils Relative %: 77 %
PLATELETS: 155 10*3/uL (ref 150–400)
RBC: 2.58 MIL/uL — AB (ref 3.87–5.11)
RDW: 15.2 % (ref 11.5–15.5)
WBC: 14.1 10*3/uL — AB (ref 4.0–10.5)

## 2018-03-06 LAB — PREPARE RBC (CROSSMATCH)

## 2018-03-06 MED ORDER — FERROUS FUMARATE 324 (106 FE) MG PO TABS
1.0000 | ORAL_TABLET | Freq: Two times a day (BID) | ORAL | Status: DC
  Administered 2018-03-06 – 2018-03-07 (×3): 106 mg via ORAL
  Filled 2018-03-06 (×3): qty 1

## 2018-03-06 MED ORDER — ACETAMINOPHEN 325 MG PO TABS
650.0000 mg | ORAL_TABLET | Freq: Once | ORAL | Status: AC
  Administered 2018-03-06: 650 mg via ORAL
  Filled 2018-03-06: qty 2

## 2018-03-06 MED ORDER — SODIUM CHLORIDE 0.9% IV SOLUTION: Freq: Once | INTRAVENOUS | Status: DC

## 2018-03-06 MED ORDER — DIPHENHYDRAMINE HCL 25 MG PO CAPS
25.0000 mg | ORAL_CAPSULE | Freq: Once | ORAL | Status: AC
  Administered 2018-03-06: 25 mg via ORAL
  Filled 2018-03-06: qty 1

## 2018-03-06 NOTE — Progress Notes (Signed)
POSTPARTUM PROGRESS NOTE  Post Partum Day #1  Subjective:  Lindsey Sellers is a 32 y.o. G1P1001 s/p SVD at [redacted]w[redacted]d.  She reports she is doing well aside from feeling diffusely fatigued and tired. She notes she has not gotten much sleep, primarily 2/2 tending to baby. NAEON. No problems with ambulating to and from the bathroom, SOB, dizziness, HA. Pt reports last syncopal episode was 03/05/18 AM around 0900. Voiding well. Passing gas. No BMs. Pain is well controlled.  Lochia is gradually slowing.  Objective: Blood pressure 100/68, pulse 76, temperature 98.4 F (36.9 C), temperature source Oral, resp. rate 18, height 5\' 4"  (1.626 m), weight 81.7 kg, last menstrual period 05/27/2017, SpO2 100 %, unknown if currently breastfeeding.  Physical Exam:  General: alert, cooperative and no distress Chest: no respiratory distress Heart:regular rate, distal pulses intact Abdomen: soft, mildly tender throughout Uterine Fundus: firm, appropriately tender DVT Evaluation: No calf swelling or tenderness Extremities: No edema Skin: warm, dry  Recent Labs    03/05/18 0648 03/06/18 0551  HGB 10.4* 6.4*  HCT 30.3* 18.9*    Assessment/Plan: Lindsey Sellers is a 32 y.o. G1P1001 s/p SVD at [redacted]w[redacted]d c/b 3A lac (repaired with suture) and PPH w/EBL 1062 mL s/p TXA and methergine.   PPD#1 - Doing well aside from general fatigue in setting of symptomatic anemia. -- Routine postpartum care -- Baby needs inpatient circ  Anemia: Pt continues to feel fatigued this morning in setting of [place] w/EBL 1062 mL. Given Hgb this AM of 6.4 (vs 10.4 after delivery yesterday), will plan to tranfuse RBCs.  -- Hgb 6.4 -- Discussed RBC transfusion for this morning with patient -- Repeat CBC following transfusion  Contraception: POPs Feeding: Breast Dispo: Plan for discharge tomorrow   LOS: 2 days   Evangeline Gula, MS3 03/06/2018, 7:09 AM  CNM attestation Post Partum Day #1 I have seen and examined this  patient and agree with above documentation in the med student's note.   Lindsey Sellers is a 32 y.o. G1P1001 s/p SVD.  Pt denies problems with ambulating, voiding or po intake. She is feeling very tired and fatigued. Pain is well controlled.  Plan for birth control is oral progesterone-only contraceptive.  Method of Feeding: breast  PE:  BP 100/68   Pulse 76   Temp 98.4 F (36.9 C) (Oral)   Resp 18   Ht 5\' 4"  (1.626 m)   Wt 81.7 kg   LMP 05/27/2017   SpO2 100%   Breastfeeding? Unknown   BMI 30.91 kg/m  Fundus firm  Plan: Will transfuse 3u PRBCs today with pt's consent; collect CBC post tx Anticipate d/c 03/07/18  Serita Grammes, CNM 9:23 AM  03/06/2018

## 2018-03-06 NOTE — Plan of Care (Signed)
Patient is currently receiving a unit of blood and will receive 2 more afterwards.  She feels weak when ambulating but no dizziness.  She is eating well and her pain is well controlled.  Will continue to monitor.

## 2018-03-06 NOTE — Progress Notes (Signed)
D/w parents re: circ, r/b to it and they would like it done. D/w nursery and try and get it done today. Infant having some feeding issues.  Durene Romans MD Attending Center for [employer] (Faculty Practice) 03/06/2018 Time: [phone]

## 2018-03-07 MED ORDER — NORETHINDRONE 0.35 MG PO TABS: 1.0000 | ORAL_TABLET | Freq: Every day | ORAL | 11 refills | Status: DC

## 2018-03-07 MED ORDER — FERROUS FUMARATE 324 (106 FE) MG PO TABS: 1.0000 | ORAL_TABLET | Freq: Two times a day (BID) | ORAL | 1 refills | Status: DC

## 2018-03-07 MED ORDER — SENNOSIDES-DOCUSATE SODIUM 8.6-50 MG PO TABS: 2.0000 | ORAL_TABLET | ORAL | 1 refills | Status: DC

## 2018-03-07 MED ORDER — IBUPROFEN 600 MG PO TABS: 600.0000 mg | ORAL_TABLET | Freq: Four times a day (QID) | ORAL | 0 refills | Status: DC

## 2018-03-07 MED FILL — NORETHINDRONE 0.35 MG TAB: 0.35 | 84 days supply | Qty: 84 | Fill #0

## 2018-03-07 MED FILL — IBUPROFEN 600 MG TABLET: 600 | 5 days supply | Qty: 30 | Fill #0

## 2018-03-07 NOTE — Progress Notes (Addendum)
CBC resulted with Hgb 8.4. Physician notified and stated that per patient's request, she could wait until tomorrow ro receive the 3rd unit of blood.

## 2018-03-07 NOTE — Discharge Summary (Signed)
OB Discharge Summary     Patient Name: Lindsey Sellers DOB: 07-21-1985 MRN: [id number]  Date of admission: 03/04/2018 Delivering MD: Orlene Plum A   Date of discharge: 03/07/2018  Admitting diagnosis: 40wks Intrauterine pregnancy: [redacted]w[redacted]d     Secondary diagnosis:  Active Problems:   Indication for care in labor and delivery, antepartum  Additional problems: Metaline 1062cc     Discharge diagnosis: Term Pregnancy Delivered                                                                                                Post partum procedures:blood transfusion  Augmentation: AROM  Complications: [id number]  Hospital course:  Onset of Labor With Vaginal Delivery     32 y.o. yo G1P1001 at [redacted]w[redacted]d was admitted in Active Labor on 03/04/2018. Patient had an uncomplicated labor course as follows:  Membrane Rupture Time/Date: 7:23 PM ,03/04/2018   Intrapartum Procedures: Episiotomy: None [1]                                         Lacerations:  3rd degree [4]  Patient had a delivery of a Viable infant. 03/05/2018  Information for the patient's newborn:  Trula, Frede [[id number]]  Delivery Method: Vaginal, Spontaneous(Filed from Delivery Summary)    Pateint had an uncomplicated postpartum course.  She is ambulating, tolerating a regular diet, passing flatus, and urinating well. Patient is discharged home in stable condition on 03/07/18.   Physical exam  Vitals:   03/06/18 1740 03/06/18 1904 03/06/18 2205 03/07/18 0515  BP: (!) 125/91 121/82 120/80 118/82  Pulse: 100 96 90 81  Resp: 18  18 18   Temp: 99.1 F (37.3 C) 98.8 F (37.1 C) 98.6 F (37 C) 98.4 F (36.9 C)  TempSrc: Oral Oral Oral Oral  SpO2:  100%    Weight:      Height:       General: alert, cooperative and no distress Lochia: appropriate Uterine Fundus: firm Incision: N/A DVT Evaluation: No evidence of DVT seen on physical exam. Negative Homan's sign. No cords or calf tenderness. Labs: Lab  Results  Component Value Date   WBC 14.1 (H) 03/06/2018   HGB 8.4 (L) 03/06/2018   HCT 24.3 (L) 03/06/2018   MCV 94.2 03/06/2018   PLT 155 03/06/2018   CMP Latest Ref Rng & Units 02/07/2018  Glucose 70 - 99 mg/dL 80  BUN 6 - 20 mg/dL 8  Creatinine 0.44 - 1.00 mg/dL 0.53  Sodium 135 - 145 mmol/L 132(L)  Potassium 3.5 - 5.1 mmol/L 3.6  Chloride 98 - 111 mmol/L 105  CO2 22 - 32 mmol/L 17(L)  Calcium 8.9 - 10.3 mg/dL 8.7(L)  Total Protein 6.5 - 8.1 g/dL 6.5  Total Bilirubin 0.3 - 1.2 mg/dL 0.6  Alkaline Phos 38 - 126 U/L 129(H)  AST 15 - 41 U/L 20  ALT 0 - 44 U/L 19    Discharge instruction: per After Visit Summary and "Baby and Me Booklet".  After visit meds:  Allergies as of 03/07/2018   No Known Allergies     Medication List    TAKE these medications   Ferrous Fumarate 324 (106 Fe) MG Tabs tablet Commonly known as:  HEMOCYTE - 106 mg FE Take 1 tablet (106 mg of iron total) by mouth 2 (two) times daily.   ibuprofen 600 MG tablet Commonly known as:  ADVIL,MOTRIN Take 1 tablet (600 mg total) by mouth every 6 (six) hours.   norethindrone 0.35 MG tablet Commonly known as:  MICRONOR,CAMILA,ERRIN Take 1 tablet (0.35 mg total) by mouth daily.   PRENATAL VITAMIN PO Take by mouth.   senna-docusate 8.6-50 MG tablet Commonly known as:  Senokot-S Take 2 tablets by mouth daily. For 2-3 weeks Start taking on:  03/08/2018       Diet: routine diet  Activity: Advance as tolerated. Pelvic rest for 6 weeks.   Outpatient follow up: Follow up Appt: Future Appointments  Date Time Provider [place]  04/01/2018 11:00 AM Leftwich-Kirby, Kathie Dike, CNM [facility]   Follow up Visit:No follow-ups on file.  Postpartum contraception: Progesterone only pills  Newborn Data: Live born female  Birth Weight: 8 lb 5.4 oz (3782 g) APGAR: 9, 9  Newborn Delivery   Birth date/time:  03/05/2018 02:27:00 Delivery type:  Vaginal, Spontaneous     Baby Feeding:  Breast Disposition:home with mother   03/07/2018 Christin Fudge, CNM

## 2018-03-07 NOTE — Lactation Note (Signed)
This note was copied from a baby's chart. Lactation Consultation Note  Patient Name: Lindsey Sellers [id number] Date: 03/07/2018 Reason for consult: Follow-up assessment;Mother's request;1st time breastfeeding;Primapara;Other (Comment)(PPH / transfused - 7 % weight loss - see LC note )  Baby is 56 hours  As Lc entered the room baby ready to feed. LC checked diaper dry.  Baby placed STS on the right breast / football / and due to weight loss/ ( and parents had already started supplementing 11-7 am  and PPH ) LC added the % F SNS after the baby fed and showed dad how to place it. Baby fed well and took 12 ml/ LC released it and the 2nd syringe had dad insert the 81F SNS in the side of the mouth and he did great.  Baby finished feeding in 15 -20 mins /per mom comfortable.  LC heard swallows prior to 81F SNS started.   LC concerned over the cone shaped breast / mom mentioned the areola got darker, and Nipples and breast were sensitive, but no change in cup size.  LC discussed she had limited changes , extra bleeding, even though she was transfused,  Extra post pumping after 4-5 feedings a day would be indicated and then to be reassessed in 5-7 days. Mom receptive to [facility] recommendation for [facility] O/P F/U and is aware the [facility] would place a request on the [facility] clinic basket to call her to schedule an appt.  Mom did not report breast are fuller and heavier today.   Per mom has DEBP Medela for home. And LC reviewed the hand pump and recommended due to sore nipples after breast massage , hand express, pre-pump to prime the milk ducts and make the nipple / areola complex more elastic for a deeper latch.  Mom has comfort gels x 6 days and coconut oil and is aware to alterate.   Sore nipple and engorgement prevention and tx reviewed. Mother informed of post-discharge support and given phone number to the lactation department, including services for phone call assistance; out-patient appointments; and breastfeeding  support group. List of other breastfeeding resources in the community given in the handout. Encouraged mother to call for problems or concerns related to breastfeeding.   Maternal Data Has patient been taught Hand Expression?: Yes  Feeding Feeding Type: Formula  LATCH Score Latch: Grasps breast easily, tongue down, lips flanged, rhythmical sucking.  Audible Swallowing: Spontaneous and intermittent  Type of Nipple: Everted at rest and after stimulation  Comfort (Breast/Nipple): Soft / non-tender  Hold (Positioning): Assistance needed to correctly position infant at breast and maintain latch.  LATCH Score: 9  Interventions Interventions: Breast feeding basics reviewed;Assisted with latch;Skin to skin;Breast massage;Breast compression;Adjust position;Support pillows;Position options  Lactation Tools Discussed/Used Pump Review: Setup, frequency, and cleaning;Milk Storage   Consult Status Consult Status: Follow-up Follow-up type: [place] 03/07/2018, 10:52 AM

## 2018-03-07 NOTE — Discharge Instructions (Signed)
Postpartum Care After Vaginal Delivery °The period of time right after you deliver your newborn is called the postpartum period. °What kind of medical care will I receive? °· You may continue to receive fluids and medicines through an IV tube inserted into one of your veins. °· If an incision was made near your vagina (episiotomy) or if you had some vaginal tearing during delivery, cold compresses may be placed on your episiotomy or your tear. This helps to reduce pain and swelling. °· You may be given a squirt bottle to use when you go to the bathroom. You may use this until you are comfortable wiping as usual. To use the squirt bottle, follow these steps: °? Before you urinate, fill the squirt bottle with warm water. Do not use hot water. °? After you urinate, while you are sitting on the toilet, use the squirt bottle to rinse the area around your urethra and vaginal opening. This rinses away any urine and blood. °? You may do this instead of wiping. As you start healing, you may use the squirt bottle before wiping yourself. Make sure to wipe gently. °? Fill the squirt bottle with clean water every time you use the bathroom. °· You will be given sanitary pads to wear. °How can I expect to feel? °· You may not feel the need to urinate for several hours after delivery. °· You will have some soreness and pain in your abdomen and vagina. °· If you are breastfeeding, you may have uterine contractions every time you breastfeed for up to several weeks postpartum. Uterine contractions help your uterus return to its normal size. °· It is normal to have vaginal bleeding (lochia) after delivery. The amount and appearance of lochia is often similar to a menstrual period in the first week after delivery. It will gradually decrease over the next few weeks to a dry, yellow-brown discharge. For most women, lochia stops completely by 6-8 weeks after delivery. Vaginal bleeding can vary from woman to woman. °· Within the first few  days after delivery, you may have breast engorgement. This is when your breasts feel heavy, full, and uncomfortable. Your breasts may also throb and feel hard, tightly stretched, warm, and tender. After this occurs, you may have milk leaking from your breasts. Your health care provider can help you relieve discomfort due to breast engorgement. Breast engorgement should go away within a few days. °· You may feel more sad or worried than normal due to hormonal changes after delivery. These feelings should not last more than a few days. If these feelings do not go away after several days, speak with your health care provider. °How should I care for myself? °· Tell your health care provider if you have pain or discomfort. °· Drink enough water to keep your urine clear or pale yellow. °· Wash your hands thoroughly with soap and water for at least 20 seconds after changing your sanitary pads, after using the toilet, and before holding or feeding your baby. °· If you are not breastfeeding, avoid touching your breasts a lot. Doing this can make your breasts produce more milk. °· If you become weak or lightheaded, or you feel like you might faint, ask for help before: °? Getting out of bed. °? Showering. °· Change your sanitary pads frequently. Watch for any changes in your flow, such as a sudden increase in volume, a change in color, the passing of large blood clots. If you pass a blood clot from your vagina, save it   to show to your health care provider. Do not flush blood clots down the toilet without having your health care provider look at them. °· Make sure that all your vaccinations are up to date. This can help protect you and your baby from getting certain diseases. You may need to have immunizations done before you leave the hospital. °· If desired, talk with your health care provider about methods of family planning or birth control (contraception). °How can I start bonding with my baby? °Spending as much time as  possible with your baby is very important. During this time, you and your baby can get to know each other and develop a bond. Having your baby stay with you in your room (rooming in) can give you time to get to know your baby. Rooming in can also help you become comfortable caring for your baby. Breastfeeding can also help you bond with your baby. °How can I plan for returning home with my baby? °· Make sure that you have a car seat installed in your vehicle. °? Your car seat should be checked by a certified car seat installer to make sure that it is installed safely. °? Make sure that your baby fits into the car seat safely. °· Ask your health care provider any questions you have about caring for yourself or your baby. Make sure that you are able to contact your health care provider with any questions after leaving the hospital. °This information is not intended to replace advice given to you by your health care provider. Make sure you discuss any questions you have with your health care provider. °Document Released: 02/04/2007 Document Revised: 09/12/2015 Document Reviewed: 03/14/2015 °Elsevier Interactive Patient Education © 2018 [employer] ° °

## 2018-03-07 NOTE — Progress Notes (Signed)
Patient has stated that she would like to receive her 3rd unit of blood tomorrow if possible due to "wanting a break" and "feeling stiff" from sitting for prolonged period of time. Physician has been made aware. CBC has been ordered. Results will determine when 3rd unit will be given.

## 2018-03-08 ENCOUNTER — Inpatient Hospital Stay (HOSPITAL_COMMUNITY)
Admission: AD | Admit: 2018-03-08 | Discharge: 2018-03-09 | Disposition: A | Discharge status: Home / Self Care | Payer: 59 | Source: Ambulatory Visit | Attending: Obstetrics & Gynecology | Admitting: Obstetrics & Gynecology

## 2018-03-08 ENCOUNTER — Encounter (HOSPITAL_COMMUNITY): Payer: Self-pay | Admitting: *Deleted

## 2018-03-08 DIAGNOSIS — Z8269 Family history of other diseases of the musculoskeletal system and connective tissue: Secondary | ICD-10-CM | POA: Insufficient documentation

## 2018-03-08 DIAGNOSIS — R748 Abnormal levels of other serum enzymes: Secondary | ICD-10-CM | POA: Diagnosis not present

## 2018-03-08 DIAGNOSIS — Z79899 Other long term (current) drug therapy: Secondary | ICD-10-CM | POA: Insufficient documentation

## 2018-03-08 DIAGNOSIS — N939 Abnormal uterine and vaginal bleeding, unspecified: Secondary | ICD-10-CM

## 2018-03-08 DIAGNOSIS — O165 Unspecified maternal hypertension, complicating the puerperium: Secondary | ICD-10-CM | POA: Diagnosis not present

## 2018-03-08 LAB — BPAM RBC
BLOOD PRODUCT EXPIRATION DATE: 201912012359
Blood Product Expiration Date: 201911282359
Blood Product Expiration Date: 201912062359
ISSUE DATE / TIME: 201911141135
ISSUE DATE / TIME: 201911141643
UNIT TYPE AND RH: 6200
UNIT TYPE AND RH: 6200
Unit Type and Rh: 6200

## 2018-03-08 LAB — TYPE AND SCREEN
ABO/RH(D): A POS
Antibody Screen: NEGATIVE
UNIT DIVISION: 0
Unit division: 0
Unit division: 0

## 2018-03-08 NOTE — MAU Note (Signed)
SVD 03/06/18. Had [facility] and received 2 units blood before d/c. Today has had normal vag bleeding but has passed one golfball sized clot and one alittle larger. Wanted to be sure everything ok considering pph

## 2018-03-09 DIAGNOSIS — R748 Abnormal levels of other serum enzymes: Secondary | ICD-10-CM | POA: Diagnosis not present

## 2018-03-09 DIAGNOSIS — Z8269 Family history of other diseases of the musculoskeletal system and connective tissue: Secondary | ICD-10-CM | POA: Diagnosis not present

## 2018-03-09 DIAGNOSIS — O165 Unspecified maternal hypertension, complicating the puerperium: Secondary | ICD-10-CM | POA: Diagnosis not present

## 2018-03-09 DIAGNOSIS — Z79899 Other long term (current) drug therapy: Secondary | ICD-10-CM | POA: Diagnosis not present

## 2018-03-09 LAB — COMPREHENSIVE METABOLIC PANEL
ALK PHOS: 89 U/L (ref 38–126)
ALT: 120 U/L — AB (ref 0–44)
AST: 95 U/L — AB (ref 15–41)
Albumin: 3 g/dL — ABNORMAL LOW (ref 3.5–5.0)
Anion gap: 9 (ref 5–15)
BILIRUBIN TOTAL: 0.5 mg/dL (ref 0.3–1.2)
BUN: 8 mg/dL (ref 6–20)
CALCIUM: 8.3 mg/dL — AB (ref 8.9–10.3)
CHLORIDE: 105 mmol/L (ref 98–111)
CO2: 23 mmol/L (ref 22–32)
CREATININE: 0.53 mg/dL (ref 0.44–1.00)
Glucose, Bld: 104 mg/dL — ABNORMAL HIGH (ref 70–99)
Potassium: 3.5 mmol/L (ref 3.5–5.1)
Sodium: 137 mmol/L (ref 135–145)
Total Protein: 6.2 g/dL — ABNORMAL LOW (ref 6.5–8.1)

## 2018-03-09 LAB — CBC
HEMATOCRIT: 28.5 % — AB (ref 36.0–46.0)
HEMOGLOBIN: 9.6 g/dL — AB (ref 12.0–15.0)
MCH: 31.9 pg (ref 26.0–34.0)
MCHC: 33.7 g/dL (ref 30.0–36.0)
MCV: 94.7 fL (ref 80.0–100.0)
NRBC: 0.2 % (ref 0.0–0.2)
Platelets: 214 10*3/uL (ref 150–400)
RBC: 3.01 MIL/uL — AB (ref 3.87–5.11)
RDW: 14.8 % (ref 11.5–15.5)
WBC: 11 10*3/uL — ABNORMAL HIGH (ref 4.0–10.5)

## 2018-03-09 LAB — PROTEIN / CREATININE RATIO, URINE
CREATININE, URINE: 42 mg/dL
Total Protein, Urine: <6 mg/dL

## 2018-03-09 MED ORDER — AMLODIPINE BESYLATE 10 MG PO TABS
10.0000 mg | ORAL_TABLET | Freq: Once | ORAL | Status: AC
  Administered 2018-03-09: 10 mg via ORAL
  Filled 2018-03-09: qty 1

## 2018-03-09 MED ORDER — AMLODIPINE BESYLATE 10 MG PO TABS: 10.0000 mg | ORAL_TABLET | Freq: Every day | ORAL | 1 refills | Status: DC

## 2018-03-09 NOTE — Discharge Instructions (Signed)

## 2018-03-09 NOTE — MAU Provider Note (Signed)
History     CSN: [id number]  Arrival date and time: 03/08/18 2256   First Provider Initiated Contact with Patient 03/08/18 2352      Chief Complaint  Patient presents with  . Vaginal Bleeding   HPI   Ms.Lindsey Sellers is a 32 y.o. female G1P1001 status post vaginal delivery on 03/05/18 here in MAU with complaints of heavier bleeding. Her labor was complicated by postpartum hemorrhage and 3rd degree laceration. She did receive PRBC's post partum. She was discharged home on 11/15.   Says today she noticed 3 golf ball size clots throughout the day. Says her bleeding is otherwise minimal to moderate which did not concerning to her. The clots are what brought her in. Says she did more activity today than normal.   No pain.   OB History    Gravida  1   Para  1   Term  1   Preterm  0   AB  0   Living  1     SAB  0   TAB  0   Ectopic  0   Multiple  0   Live Births  1           Past Medical History:  Diagnosis Date  . Abnormal pap 04/2006  . 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 Medical history non-contributory     Past Surgical History:  Procedure Laterality Date  . COLPOSCOPY  2008   no biopsy done  . WISDOM TOOTH EXTRACTION      Family History  Problem Relation Age of Onset  . Cancer Paternal Grandmother        lymph node  . Gout Father   . Colon cancer Neg Hx   . Breast cancer Neg Hx     Social History   Tobacco Use  . Smoking status: Never Smoker  . Smokeless tobacco: Never Used  Substance Use Topics  . Alcohol use: Not Currently    Alcohol/week: 5.0 standard drinks    Types: 2 Glasses of wine, 3 Standard drinks or equivalent per week  . Drug use: No    Allergies: No Known Allergies  Medications Prior to Admission  Medication Sig Dispense Refill Last Dose  . Ferrous Fumarate (HEMOCYTE - 106 MG FE) 324 (106 Fe) MG TABS tablet Take 1 tablet (106 mg of iron total) by mouth 2 (two) times  daily. 30 tablet 1 03/08/2018 at Unknown time  . ibuprofen (ADVIL,MOTRIN) 600 MG tablet Take 1 tablet (600 mg total) by mouth every 6 (six) hours. 30 tablet 0 03/08/2018 at Unknown time  . Prenatal Vit-Fe Fumarate-FA (PRENATAL VITAMIN PO) Take by mouth.   03/08/2018 at Unknown time  . senna-docusate (SENOKOT-S) 8.6-50 MG tablet Take 2 tablets by mouth daily. For 2-3 weeks 45 tablet 1 03/08/2018 at Unknown time  . norethindrone (ORTHO MICRONOR) 0.35 MG tablet Take 1 tablet (0.35 mg total) by mouth daily. 1 Package 11 Unknown at Unknown time   Results for orders placed or performed during the hospital encounter of 03/08/18 (from the past 48 hour(s))  CBC     Status: Abnormal   Collection Time: 03/08/18 11:53 PM  Result Value Ref Range   WBC 11.0 (H) 4.0 - 10.5 K/uL   RBC 3.01 (L) 3.87 - 5.11 MIL/uL   Hemoglobin 9.6 (L) 12.0 - 15.0 g/dL   HCT 28.5 (L) 36.0 - 46.0 %   MCV 94.7 80.0 - 100.0 fL  MCH 31.9 26.0 - 34.0 pg   MCHC 33.7 30.0 - 36.0 g/dL   RDW 14.8 11.5 - 15.5 %   Platelets 214 150 - 400 K/uL   nRBC 0.2 0.0 - 0.2 %    Comment: Performed at [facility], [address], [place], [place] [address]  Comprehensive metabolic panel     Status: Abnormal   Collection Time: 03/09/18 12:05 AM  Result Value Ref Range   Sodium 137 135 - 145 mmol/L   Potassium 3.5 3.5 - 5.1 mmol/L   Chloride 105 98 - 111 mmol/L   CO2 23 22 - 32 mmol/L   Glucose, Bld 104 (H) 70 - 99 mg/dL   BUN 8 6 - 20 mg/dL   Creatinine, Ser 0.53 0.44 - 1.00 mg/dL   Calcium 8.3 (L) 8.9 - 10.3 mg/dL   Total Protein 6.2 (L) 6.5 - 8.1 g/dL   Albumin 3.0 (L) 3.5 - 5.0 g/dL   AST 95 (H) 15 - 41 U/L   ALT 120 (H) 0 - 44 U/L   Alkaline Phosphatase 89 38 - 126 U/L   Total Bilirubin 0.5 0.3 - 1.2 mg/dL   GFR calc non Af Amer >60 >60 mL/min   GFR calc Af Amer >60 >60 mL/min    Comment: (NOTE) The eGFR has been calculated using the CKD EPI equation. This calculation has not been validated in all clinical  situations. eGFR's persistently <60 mL/min signify possible Chronic Kidney Disease.    Anion gap 9 5 - 15    Comment: Performed at [facility], [address] [place], [place] [address]  Protein / creatinine ratio, urine     Status: None   Collection Time: 03/09/18 12:57 AM  Result Value Ref Range   Creatinine, Urine 42.00 mg/dL   Total Protein, Urine <6 mg/dL   Protein Creatinine Ratio        0.00 - 0.15 mg/mg[Cre]    Comment: RESULT BELOW REPORTABLE RANGE, UNABLE TO CALCULATE. Performed at [facility], [address], [place], [place] [address]    Review of Systems  Eyes: Negative for photophobia and visual disturbance.  Gastrointestinal: Negative for abdominal pain.  Genitourinary: Positive for vaginal bleeding.  Neurological: Negative for dizziness and light-headedness.   Physical Exam   Blood pressure (!) 141/91, pulse 97, temperature 98.4 F (36.9 C), resp. rate 18, height 5' 4"  (1.626 m), weight 76.2 kg, currently breastfeeding.   Patient Vitals for the past 24 hrs:  BP Temp Temp src Pulse Resp Height Weight  03/09/18 0300 128/86 - - 94 - - -  03/09/18 0244 - 98.1 F (36.7 C) Axillary - - - -  03/09/18 0241 127/84 - - 99 - - -  03/09/18 0239 127/84 - - - - - -  03/09/18 0044 (!) 141/91 - - 97 - - -  03/09/18 0013 (!) 153/94 - - 96 - - -  03/09/18 0011 (!) 153/97 - - 97 - - -  03/08/18 2351 139/89 - - 96 - - -  03/08/18 2350 (!) 147/89 - - 98 - - -  03/08/18 2313 138/90 98.4 F (36.9 C) - 97 18 5' 4"  (1.626 m) 76.2 kg   Physical Exam  Constitutional: She is oriented to person, place, and time. She appears well-developed and well-nourished. No distress.  GI: Soft. She exhibits no distension. There is no tenderness. There is no rebound and no guarding.  Genitourinary:  Genitourinary Comments: Bimanual exam: Cervix open and anterior No uterine tenderness  Small blood on perineum and pad, no clots.  Chaperone present for exam.   Musculoskeletal: She  exhibits no edema.  Neurological: She is alert and oriented to person, place, and time. She displays normal reflexes.  Skin: Skin is warm. She is not diaphoretic.  Psychiatric: Her behavior is normal.   MAU Course  Procedures  None  MDM  PIH Labs  Hgb improving from DC  Cath urine with normal PCR Discussed patient with Dr. Roselie Awkward; liver enzymes elevated. Patient without headache or scotoma. Minimal blood on pad at discharge.  Norvasc 10 mg given PO   Assessment and Plan   A:  1. Elevated liver enzymes   2. Postpartum hemorrhage, unspecified type   3. Postpartum hypertension   4. Episode of heavy vaginal bleeding     P:  Discharge home in stable condition Bleeding precautions Rx: Norvasc 10 mg daily Message sent to [facility] for appointment on Wed in the office for BP check and labs PreE precautions. Return to MAU if symptoms worsen  Geno Sydnor, Artist Pais, NP 03/09/2018 1:05 PM

## 2018-03-10 ENCOUNTER — Telehealth: Payer: Self-pay | Admitting: *Deleted

## 2018-03-10 NOTE — Telephone Encounter (Signed)
Pt to come into office Wednesday for BP check and [place] labs per Huntley Dec, NP

## 2018-03-10 NOTE — Telephone Encounter (Signed)
-----   Message from Lezlie Lye, NP sent at 03/09/2018 12:59 PM EST ----- Regarding: Blood pressure check and labs Hello,  Lindsey Sellers was seen in the MAU on Saturday night and had elevated BP and elevated liver enzymes, she was DC'd home on BP medication. Can you see her in the office Weds? For labs and BP check please? Thank you!   Noni Saupe, NP

## 2018-03-11 ENCOUNTER — Inpatient Hospital Stay (HOSPITAL_COMMUNITY): Admission: RE | Admit: 2018-03-11 | Payer: 59 | Source: Ambulatory Visit

## 2018-03-12 ENCOUNTER — Other Ambulatory Visit: Payer: 59 | Admitting: *Deleted

## 2018-03-12 ENCOUNTER — Encounter: Payer: Self-pay | Admitting: *Deleted

## 2018-03-12 VITALS — BP 119/87 | HR 100

## 2018-03-12 DIAGNOSIS — I10 Essential (primary) hypertension: Secondary | ICD-10-CM

## 2018-03-12 DIAGNOSIS — R309 Painful micturition, unspecified: Secondary | ICD-10-CM | POA: Diagnosis not present

## 2018-03-12 NOTE — Progress Notes (Signed)
Subjective:  Lindsey Sellers is a 32 y.o. female here for BP check and PIH labs 1 week post delivery  Hypertension ROS: taking medications as instructed, no medication side effects noted, no TIA's, no chest pain on exertion, no dyspnea on exertion and no swelling of ankles.    Objective:  BP 119/87   Pulse 100   Breastfeeding? Yes   Appearance alert, well appearing, and in no distress. General exam BP noted to be well controlled today in office.    Assessment:   Blood Pressure improved.   Plan:  Current treatment plan is effective, no change in therapy.Marland Kitchen

## 2018-03-13 ENCOUNTER — Ambulatory Visit: Payer: Self-pay

## 2018-03-13 NOTE — Lactation Note (Signed)
This note was copied from a baby's chart. 03/13/2018  Name: Lindsey Sellers MRN: [id number] Date of Birth: 03/05/2020 Gestational Age: Gestational Age: [redacted]w[redacted]d Birth Weight: 133.4 oz Weight today:    8 pounds 1.8 ounces (3680 grams) with clean newborn diaper   Term 79 day old infant presents today with  Mom and dad for feeding assessment. Mom concerned with milk supply.   Infant has gained 175 grams in the last 6 days with an average daily weight gain of 29 grams a day. Infant has gained 5.5 ounces since Monday. Infants lowest weight was 7 pounds 11 ounces.   Mom reports infant is sleepy at the breast. Parents report they were using the 5 french feeding tube at the breast over the weekend, they stopped using as it became too much to deal with. Parents reports infant with no output over the weekend. They increase formula volume after ped appt on Monday and infant weight is up and output has increased significantly.   Mom reports nipple pain to the right breast, not so much on the left breast. Mom is using comfort gels and coconut oil. Mom has a new set of comfort gels to begin using this week. Mom reports pain much better today.   Mom is pumping once a day and getting about 12-15 ml. Reviewed increasing her pumping to stimulate milk production. Enc mom to increase pumping to stimulate production. Enc mom to limit BF as infant is not very active at the breast.   Mom appears to not have a full milk supply. Mom with wide spaced cone shaped breasts. Mom with history of PPH also.   Showed mom how to flange lips after latch. Infant opens wide with latch, upper lip needs flanging after latch, mom and dad were shown how to do after latch.   Infant to follow up with Dr. Sharlene Motts on 11/27. Mom has not spoken with [facility] Nurse yet. Infant to follow up with Lactation Next Tuesday 11/26 at 3:30 pm.   Parents report questions/concerns have been answered.   General Information: Mother's reason for  visit: feeding assessment Consult: Initial Lactation consultant: Nonah Mattes RN,IBCLC Breastfeeding experience: latching for long periods, needing supplement Maternal medical conditions: Pregnancy induced hypertension, Post-partum hemorrhage([facility] Transfusion x 2, minimal breast growth with pregnancy, preeclampsia post partum) Maternal medications: Iron, Pre-natal vitamin, Other, Stool softener, Motrin (ibuprofen)(Norvasc)  Breastfeeding History: Frequency of breast feeding: every 2-3 hours Duration of feeding: 1-1.5 hours  Supplementation: Supplement method: bottle([facility], Dr. Saul Fordyce) Brand: Similac Formula volume: 1-2 ounces Formula frequency: 4-8 x a day Total formula volume per day: 8-12 ounces Breast milk volume: 12-15 ml Breast milk frequency: BID Total breast milk volume per day: 25-30 ml Pump type: Medela pump in style Pump frequency: 1 x a day Pump volume: 12-15 ml  Infant Output Assessment: Voids per 24 hours: 2 Urine color: Clear yellow Stools per 24 hours: 2 Stool color: Yellow(turned yellow seedy today)  Breast Assessment: Breast: Soft, Compressible Nipple: Erect   Pain interventions: Bra, Coconut oil, Expressed breast milk, Breast pump, Hydrogel dressing  Feeding Assessment: Infant oral assessment: Variance Infant oral assessment comment: Infant with thick labial frenulum that inserts at the bottom of the gum ridge. Upper lip tight with flanging and on the breast. Infant with good tongue extension and lateralization. Infant has decreased mid tongue elevation. Nipples slightly compressed post feeding. Will reassess at next feeding assessment.  Positioning: Cross cradle(left breast, 15 minutes) Latch: 1 - Repeated attempts needed to sustain latch,  nipple held in mouth throughout feeding, stimulation needed to elicit sucking reflex. Audible swallowing: 1 - A few with stimulation Type of nipple: 2 - Everted at rest and after stimulation Comfort: 2 -  Soft/non-tender Hold: 1 - Assistance needed to correctly position infant at breast and maintain latch LATCH score: 7 Latch assessment: Deep Lips flanged: No(upper lip needs flanging) Suck assessment: Displays both   Pre-feed weight: 3680 grams Post feed weight: 3686 grams Amount transferred: 6 ml Amount supplemented: 0  Additional Feeding Assessment: Infant oral assessment: Variance Infant oral assessment comment: see above Positioning: Football(right breast, 15 mintues) Latch: 1 - Repeated attempts neede to sustain latch, nipple held in mouth throughout feeding, stimulation needed to elicit sucking reflex. Audible swallowing: 2 - Spontaneous and intermittent Type of nipple: 2 - Everted at rest and after stimulation Comfort: 1 - Filling, red/small blisters or bruises, mild/mod discomfort Hold: 1 - Assistance needed to correctly position infant at breast and maintain latch LATCH score: 7 Latch assessment: Deep Lips flanged: No(upper lip needs flanging) Suck assessment: Displays both   Pre-feed weight: 3686 grams Post feed weight: 3712 grams Amount transferred: 26 ml  Amount Supplemented 35 ml formula via bottle Amount pumped post feeding: few gtts    Totals: Total amount transferred: 32 ml     Plan:  1. Offer breast with feeding cues, limit breast feeding to 20-30 minutes per feeding 2. Keep infant awake with feeding as needed 3. Massage breasts with feedings when infant slows down with feeding 4. Empty first breast before offering second breast 5. Offer infant supplement of 1-2 ounces in a bottle of breast milk and/or formula 6. Offer infant feeding using a slow flow nipple such as your Dr. Saul Fordyce nipple 7. Feed infant using the paced bottle feeding method (video on [email or web address].[email or web address]) 8. Infant needs about 68-90 ml (2.5-3 ounces) for 8 feedings a day or 540-720 ml (18-24 ounces) in 24 hours. He may take more or less depending on how often he feeds.  9. Would recommend you  pump 6-8 x a day after breast feeding for 15-20 minutes with Medela pump to protect milk supply. Pump any time you offer infant a bottle 10. Keep up the good work 58. Thank you for allowing me to assist you today 12. Please call with any questions/concerns as needed [phone] 13. Follow up with Lactation on November 26th   [place], IBCLC                                                      Donn Pierini 03/13/2018, 2:12 PM

## 2018-03-14 LAB — CBC
HCT: 32.2 % — ABNORMAL LOW (ref 35.0–45.0)
HEMOGLOBIN: 11 g/dL — AB (ref 11.7–15.5)
MCH: 31.3 pg (ref 27.0–33.0)
MCHC: 34.2 g/dL (ref 32.0–36.0)
MCV: 91.7 fL (ref 80.0–100.0)
MPV: 10.5 fL (ref 7.5–12.5)
Platelets: 310 10*3/uL (ref 140–400)
RBC: 3.51 10*6/uL — AB (ref 3.80–5.10)
RDW: 13.8 % (ref 11.0–15.0)
WBC: 9.1 10*3/uL (ref 3.8–10.8)

## 2018-03-14 LAB — CULTURE, URINE COMPREHENSIVE
MICRO NUMBER: 91399045
SPECIMEN QUALITY: ADEQUATE

## 2018-03-14 LAB — COMPREHENSIVE METABOLIC PANEL
AG RATIO: 1.5 (calc) (ref 1.0–2.5)
ALKALINE PHOSPHATASE (APISO): 95 U/L (ref 33–115)
ALT: 75 U/L — AB (ref 6–29)
AST: 29 U/L (ref 10–30)
Albumin: 3.8 g/dL (ref 3.6–5.1)
BUN: 8 mg/dL (ref 7–25)
CALCIUM: 9.2 mg/dL (ref 8.6–10.2)
CHLORIDE: 102 mmol/L (ref 98–110)
CO2: 27 mmol/L (ref 20–32)
Creat: 0.58 mg/dL (ref 0.50–1.10)
GLOBULIN: 2.6 g/dL (ref 1.9–3.7)
Glucose, Bld: 79 mg/dL (ref 65–99)
Potassium: 4.5 mmol/L (ref 3.5–5.3)
Sodium: 138 mmol/L (ref 135–146)
Total Bilirubin: 0.3 mg/dL (ref 0.2–1.2)
Total Protein: 6.4 g/dL (ref 6.1–8.1)

## 2018-03-14 LAB — PROTEIN / CREATININE RATIO, URINE

## 2018-03-16 ENCOUNTER — Encounter (HOSPITAL_COMMUNITY): Payer: Self-pay

## 2018-03-16 ENCOUNTER — Inpatient Hospital Stay (HOSPITAL_COMMUNITY): Payer: 59

## 2018-03-16 ENCOUNTER — Observation Stay (HOSPITAL_COMMUNITY)
Admission: AD | Admit: 2018-03-16 | Discharge: 2018-03-18 | Disposition: A | Discharge status: Home / Self Care | Payer: 59 | Source: Ambulatory Visit | Attending: Obstetrics and Gynecology | Admitting: Obstetrics and Gynecology

## 2018-03-16 DIAGNOSIS — N8500 Endometrial hyperplasia, unspecified: Secondary | ICD-10-CM | POA: Diagnosis not present

## 2018-03-16 DIAGNOSIS — Z79899 Other long term (current) drug therapy: Secondary | ICD-10-CM | POA: Diagnosis not present

## 2018-03-16 DIAGNOSIS — O165 Unspecified maternal hypertension, complicating the puerperium: Secondary | ICD-10-CM | POA: Diagnosis present

## 2018-03-16 DIAGNOSIS — D591 Other autoimmune hemolytic anemias: Secondary | ICD-10-CM | POA: Insufficient documentation

## 2018-03-16 DIAGNOSIS — N939 Abnormal uterine and vaginal bleeding, unspecified: Secondary | ICD-10-CM | POA: Diagnosis not present

## 2018-03-16 DIAGNOSIS — D649 Anemia, unspecified: Secondary | ICD-10-CM

## 2018-03-16 DIAGNOSIS — O8612 Endometritis following delivery: Secondary | ICD-10-CM | POA: Diagnosis present

## 2018-03-16 LAB — COMPREHENSIVE METABOLIC PANEL
ALBUMIN: 3 g/dL — AB (ref 3.5–5.0)
ALT: 35 U/L (ref 0–44)
AST: 20 U/L (ref 15–41)
Alkaline Phosphatase: 70 U/L (ref 38–126)
Anion gap: 9 (ref 5–15)
BILIRUBIN TOTAL: 0.4 mg/dL (ref 0.3–1.2)
BUN: 10 mg/dL (ref 6–20)
CO2: 22 mmol/L (ref 22–32)
Calcium: 8.1 mg/dL — ABNORMAL LOW (ref 8.9–10.3)
Chloride: 107 mmol/L (ref 98–111)
Creatinine, Ser: 0.51 mg/dL (ref 0.44–1.00)
GFR calc Af Amer: >60 mL/min (ref >60)
Glucose, Bld: 113 mg/dL — ABNORMAL HIGH (ref 70–99)
POTASSIUM: 3.5 mmol/L (ref 3.5–5.1)
Sodium: 138 mmol/L (ref 135–145)
TOTAL PROTEIN: 5.6 g/dL — AB (ref 6.5–8.1)

## 2018-03-16 LAB — CBC
HCT: 24.7 % — ABNORMAL LOW (ref 36.0–46.0)
HEMATOCRIT: 28.9 % — AB (ref 36.0–46.0)
Hemoglobin: 9.4 g/dL — ABNORMAL LOW (ref 12.0–15.0)
Hemoglobin: 8.3 g/dL — ABNORMAL LOW (ref 12.0–15.0)
MCH: 31.3 pg (ref 26.0–34.0)
MCH: 32.3 pg (ref 26.0–34.0)
MCHC: 32.5 g/dL (ref 30.0–36.0)
MCHC: 33.6 g/dL (ref 30.0–36.0)
MCV: 96.3 fL (ref 80.0–100.0)
MCV: 96.1 fL (ref 80.0–100.0)
Platelets: 272 10*3/uL (ref 150–400)
Platelets: 314 10*3/uL (ref 150–400)
RBC: 3 MIL/uL — ABNORMAL LOW (ref 3.87–5.11)
RBC: 2.57 MIL/uL — ABNORMAL LOW (ref 3.87–5.11)
RDW: 13.9 % (ref 11.5–15.5)
RDW: 13.9 % (ref 11.5–15.5)
WBC: 8.6 10*3/uL (ref 4.0–10.5)
WBC: 21.1 10*3/uL — AB (ref 4.0–10.5)
nRBC: 0 % (ref 0.0–0.2)
nRBC: 0 % (ref 0.0–0.2)

## 2018-03-16 MED ORDER — SODIUM CHLORIDE 0.9 % IV SOLN
3.0000 g | Freq: Once | INTRAVENOUS | Status: AC
  Administered 2018-03-16: 3 g via INTRAVENOUS
  Filled 2018-03-16: qty 3

## 2018-03-16 MED ORDER — METHYLERGONOVINE MALEATE 0.2 MG/ML IJ SOLN
0.2000 mg | Freq: Once | INTRAMUSCULAR | Status: AC
  Administered 2018-03-16: 0.2 mg via INTRAMUSCULAR
  Filled 2018-03-16: qty 1

## 2018-03-16 MED ORDER — SODIUM CHLORIDE 0.9 % IV BOLUS
1000.0000 mL | Freq: Once | INTRAVENOUS | Status: AC
  Administered 2018-03-16: 1000 mL via INTRAVENOUS

## 2018-03-16 MED ORDER — MISOPROSTOL 200 MCG PO TABS
800.0000 ug | ORAL_TABLET | Freq: Once | ORAL | Status: AC
  Administered 2018-03-16: 800 ug via BUCCAL
  Filled 2018-03-16: qty 4

## 2018-03-16 MED ORDER — TRANEXAMIC ACID-NACL 1000-0.7 MG/100ML-% IV SOLN
1000.0000 mg | Freq: Once | INTRAVENOUS | Status: AC
  Administered 2018-03-16: 1000 mg via INTRAVENOUS
  Filled 2018-03-16: qty 100

## 2018-03-16 MED ORDER — OXYTOCIN 40 UNITS IN LACTATED RINGERS INFUSION - SIMPLE MED
40.0000 m[IU]/min | Freq: Once | INTRAVENOUS | Status: AC
  Administered 2018-03-16: 40 m[IU]/min via INTRAVENOUS
  Filled 2018-03-16: qty 1000

## 2018-03-16 NOTE — MAU Provider Note (Deleted)
Chief Complaint: Vaginal Bleeding   First Provider Initiated Contact with Patient 03/16/18 1903     SUBJECTIVE HPI: Lindsey Sellers is a 32 y.o. G1P1001 at 11 days S/P SVD complicated by 3rd degree perineal laceration and postpartum hemorrhage 1062 ml requiring blood transfusion who presents to Maternity Admissions reporting heavy vaginal bleeding and passage or a large clot one hour ago. Also had episode of heavy bleeding 03/09/18 and was Dx'd w/ PP GHTN. Started on Norvasc 10 mg QD.   Associated signs and symptoms: neg for fever, chills, abd pain, dizziness.   Past Medical History:  Diagnosis Date  . Abnormal pap 04/2006  . 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 Medical history non-contributory    OB History  Gravida Para Term Preterm AB Living  1 1 1  0 0 1  SAB TAB Ectopic Multiple Live Births  0 0 0 0 1    # Outcome Date GA Lbr Len/2nd Weight Sex Delivery Anes PTL Lv  1 Term 03/05/18 [redacted]w[redacted]d 17:27 / 02:00 3782 g M Vag-Spont None  LIV     Birth Comments: WNL   Past Surgical History:  Procedure Laterality Date  . COLPOSCOPY  2008   no biopsy done  . WISDOM TOOTH EXTRACTION     Social History   Socioeconomic History  . Marital status: Married    Spouse name: Not on file  . Number of children: Not on file  . Years of education: Not on file  . Highest education level: Not on file  Occupational History  . Not on file  Social Needs  . Financial resource strain: Not on file  . Food insecurity:    Worry: Not on file    Inability: Not on file  . Transportation needs:    Medical: Not on file    Non-medical: Not on file  Tobacco Use  . Smoking status: Never Smoker  . Smokeless tobacco: Never Used  Substance and Sexual Activity  . Alcohol use: Not Currently    Alcohol/week: 5.0 standard drinks    Types: 2 Glasses of wine, 3 Standard drinks or equivalent per week  . Drug use: No  . Sexual activity: Not Currently    Partners:  Male  Lifestyle  . Physical activity:    Days per week: Not on file    Minutes per session: Not on file  . Stress: Not on file  Relationships  . Social connections:    Talks on phone: Not on file    Gets together: Not on file    Attends religious service: Not on file    Active member of club or organization: Not on file    Attends meetings of clubs or organizations: Not on file    Relationship status: Not on file  . Intimate partner violence:    Fear of current or ex partner: Not on file    Emotionally abused: Not on file    Physically abused: Not on file    Forced sexual activity: Not on file  Other Topics Concern  . 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 Cancer Paternal Grandmother  lymph node  . Gout Father   . Colon cancer Neg Hx   . Breast cancer Neg Hx    No current facility-administered medications on file prior to encounter.    Current Outpatient Medications on File Prior to Encounter  Medication Sig Dispense Refill  . amLODipine (NORVASC) 10 MG tablet Take 1 tablet (10 mg total) by mouth daily. 30 tablet 1  . Ferrous Fumarate (HEMOCYTE - 106 MG FE) 324 (106 Fe) MG TABS tablet Take 1 tablet (106 mg of iron total) by mouth 2 (two) times daily. 30 tablet 1  . Prenatal Vit-Fe Fumarate-FA (PRENATAL VITAMIN PO) Take by mouth.    Marland Kitchen ibuprofen (ADVIL,MOTRIN) 600 MG tablet Take 1 tablet (600 mg total) by mouth every 6 (six) hours. 30 tablet 0  . norethindrone (ORTHO MICRONOR) 0.35 MG tablet Take 1 tablet (0.35 mg total) by mouth daily. 1 Package 11  . senna-docusate (SENOKOT-S) 8.6-50 MG tablet Take 2 tablets by mouth daily. For 2-3 weeks 45 tablet 1   No Known Allergies  I have reviewed patient's Past Medical Hx, Surgical Hx,  Family Hx, Social Hx, medications and allergies.   Review of Systems  Constitutional: Negative for chills and fever.  Gastrointestinal: Negative for abdominal pain.  Genitourinary: Positive for vaginal bleeding.  Neurological: Negative for dizziness and syncope.    OBJECTIVE Patient Vitals for the past 24 hrs:  BP Temp src Pulse Resp SpO2  03/16/18 2116 116/74 - (!) 137 - -  03/16/18 2102 122/81 - (!) 140 - -  03/16/18 2032 126/75 - (!) 132 - -  03/16/18 2015 140/80 - (!) 132 - 99 %  03/16/18 2001 127/83 - (!) 116 - -  03/16/18 1946 133/88 - (!) 115 - -  03/16/18 1931 123/78 - (!) 120 - -  03/16/18 1917 (!) 135/112 - (!) 111 - -  03/16/18 1905 (!) 130/97 Oral (!) 144 18 -   Constitutional: Well-developed, well-nourished female in no acute distress. Anxious.  Skin: pallor Cardiovascular: tachycardic  Respiratory: normal rate and effort.  GI: Abd soft, non-tender, fundus 4/SP, decreased to 2/SP after expressing clots.  MS: Extremities nontender, no edema, normal ROM Neurologic: Alert and oriented x 4.  GU: Moderate amount of BRB and large clots on pad. Small amount of active bleeding after clots expressed.   LAB RESULTS Results for orders placed or performed during the hospital encounter of 03/16/18 (from the past 24 hour(s))  CBC     Status: Abnormal   Collection Time: 03/16/18  7:27 PM  Result Value Ref Range   WBC 8.6 4.0 - 10.5 K/uL   RBC 3.00 (L) 3.87 - 5.11 MIL/uL   Hemoglobin 9.4 (L) 12.0 - 15.0 g/dL   HCT 28.9 (L) 36.0 - 46.0 %   MCV 96.3 80.0 - 100.0 fL   MCH 31.3 26.0 - 34.0 pg   MCHC 32.5 30.0 - 36.0 g/dL   RDW 13.9 11.5 - 15.5 %   Platelets 272 150 - 400 K/uL   nRBC 0.0 0.0 - 0.2 %  Comprehensive metabolic panel     Status: Abnormal   Collection Time: 03/16/18  7:27 PM  Result Value Ref Range   Sodium 138 135 - 145 mmol/L   Potassium 3.5 3.5 - 5.1 mmol/L   Chloride 107 98 - 111 mmol/L   CO2 22 22 - 32 mmol/L   Glucose, Bld 113 (H) 70 - 99 mg/dL   BUN 10  6 - 20 mg/dL   Creatinine, Ser  0.51 0.44 - 1.00 mg/dL   Calcium 8.1 (L) 8.9 - 10.3 mg/dL   Total Protein 5.6 (L) 6.5 - 8.1 g/dL   Albumin 3.0 (L) 3.5 - 5.0 g/dL   AST 20 15 - 41 U/L   ALT 35 0 - 44 U/L   Alkaline Phosphatase 70 38 - 126 U/L   Total Bilirubin 0.4 0.3 - 1.2 mg/dL   GFR calc non Af Amer >60 >60 mL/min   GFR calc Af Amer >60 >60 mL/min   Anion gap 9 5 - 15  Type and screen     Status: None   Collection Time: 03/16/18  7:27 PM  Result Value Ref Range   ABO/RH(D) A POS    Antibody Screen NEG    Sample Expiration      03/19/2018 Performed at [facility], [address], [place], [place] [address]   CBC     Status: Abnormal   Collection Time: 03/16/18  8:39 PM  Result Value Ref Range   WBC 21.1 (H) 4.0 - 10.5 K/uL   RBC 2.57 (L) 3.87 - 5.11 MIL/uL   Hemoglobin 8.3 (L) 12.0 - 15.0 g/dL   HCT 24.7 (L) 36.0 - 46.0 %   MCV 96.1 80.0 - 100.0 fL   MCH 32.3 26.0 - 34.0 pg   MCHC 33.6 30.0 - 36.0 g/dL   RDW 13.9 11.5 - 15.5 %   Platelets 314 150 - 400 K/uL   nRBC 0.0 0.0 - 0.2 %   MAU COURSE Orders Placed This Encounter  Procedures  . US PELVIS (TRANSABDOMINAL ONLY)  . CBC  . Comprehensive metabolic panel  . CBC  . Diet NPO time specified  . Type and screen   Meds ordered this encounter  Medications  . sodium chloride 0.9 % bolus 1,000 mL  . misoprostol (CYTOTEC) tablet 800 mcg  . methylergonovine (METHERGINE) injection 0.2 mg  . tranexamic acid (CYKLOKAPRON) IVPB 1,000 mg  . oxytocin (PITOCIN) IV infusion 40 units in LR 1000 mL - Premix  . Ampicillin-Sulbactam (UNASYN) 3 g in sodium chloride 0.9 % 100 mL IVPB    Order Specific Question:   Antibiotic Indication:    Answer:   Other Indication (list below)    Order Specific Question:   Other Indication:    Answer:   endometritis   Care of pt turned over to Len Blalock, CNM at 2000.  V. Smith CNM  US Pelvis (transabdominal Only)  Result Date: 03/16/2018 CLINICAL DATA:  Heavy vaginal bleeding. 11  days postpartum from vaginal delivery. EXAM: TRANSABDOMINAL ULTRASOUND OF PELVIS TECHNIQUE: Transabdominal ultrasound examination of the pelvis was performed including evaluation of the uterus, ovaries, adnexal regions, and pelvic cul-de-sac. COMPARISON:  None. FINDINGS: Uterus Measurements: 15.0 x 7.1 x 8.4 cm = volume: 472 mL. No fibroids identified. Endometrium Thickness: Diffuse endometrial thickening is seen measuring up to 48 mm. Internal blood flow is seen within soft tissue density in lower uterine segment, consistent with retained products conception. Right ovary Measurements: 2.8 x 1.3 x 1.8 cm = volume: 3.3 mL. Normal appearance/no adnexal mass. Left ovary Measurements: 3.5 x 1.3 x 1.6 cm = volume: 3.8 mL. Normal appearance/no adnexal mass. Other findings:  No abnormal free fluid. IMPRESSION: Diffuse thickening of endometrium with internal vascularity in lower uterine segment, consistent with retained products of conception. Electronically Signed   By: Earle Gell M.D.   On: 03/16/2018 20:52   EBL 700 at this time. Consulted with Dr. Elonda Husky- will give pitocin bolus, TXA  and methergine.   Repeat CBC- WBC 21.1 EBL 1060. Dr Elonda Husky updated with EBL and WBC- no large clots passed yet and bleeding approximately the same. Will give IV Unasyn for suspected endometritis while observing bleeding.  Repeat speculum exam- several small clots and bright red blood removed from vault. Large clot approximately the size of an orange noted at cervical os and removed. Cervix appeared to return to closed and only a small trickle of blood noted. Will observe bleeding while IV fluids finish.   Attempted to ambulate patient to bathroom. Patient felt dizzy like she was going to pass out after standing. Patient laid back in bed and felt better. She has not eaten since lunch so will give some food and try again after patient eats.  Attempted to have patient ambulate to the bathroom. Patient unable to stand without feeling  like she was going to pass out. Pulse up to 160s while standing. Bleeding minimal.  Repeat CBC  Hbg 6.0. Total EBL 1165 Dr. Elonda Husky updated on patient's inability to ambulate and Hgb- will admit to [facility] for blood administration.   1. Postpartum hemorrhage, delayed (> 24 hrs)   2. Symptomatic anemia    -Admit to [facility] -2U PRBCs ordered -Lasix to be given after second unit of blood -Oral methergine -Care turned over to MD.  Wende Mott, CNM 03/17/18 1:36 AM

## 2018-03-16 NOTE — MAU Note (Signed)
SVD on 11/13, reports she had a hemorrhage after delivery and got a blood transfusion  States bleeding has been okay but about an hour ago it got heavier and she passed a clot the size of an orange  She is not feeling dizzy currently

## 2018-03-17 ENCOUNTER — Observation Stay (HOSPITAL_COMMUNITY): Payer: 59

## 2018-03-17 ENCOUNTER — Encounter (HOSPITAL_COMMUNITY): Payer: Self-pay | Admitting: *Deleted

## 2018-03-17 ENCOUNTER — Other Ambulatory Visit: Payer: Self-pay

## 2018-03-17 DIAGNOSIS — O8612 Endometritis following delivery: Secondary | ICD-10-CM

## 2018-03-17 DIAGNOSIS — N8500 Endometrial hyperplasia, unspecified: Secondary | ICD-10-CM | POA: Diagnosis not present

## 2018-03-17 DIAGNOSIS — O165 Unspecified maternal hypertension, complicating the puerperium: Secondary | ICD-10-CM | POA: Diagnosis present

## 2018-03-17 DIAGNOSIS — D591 Other autoimmune hemolytic anemias: Secondary | ICD-10-CM | POA: Diagnosis not present

## 2018-03-17 DIAGNOSIS — N858 Other specified noninflammatory disorders of uterus: Secondary | ICD-10-CM | POA: Diagnosis not present

## 2018-03-17 DIAGNOSIS — Z79899 Other long term (current) drug therapy: Secondary | ICD-10-CM | POA: Diagnosis not present

## 2018-03-17 LAB — HEMOGLOBIN AND HEMATOCRIT, BLOOD
HCT: 29.1 % — ABNORMAL LOW (ref 36.0–46.0)
Hemoglobin: 9.9 g/dL — ABNORMAL LOW (ref 12.0–15.0)

## 2018-03-17 LAB — CBC
HCT: 18.1 % — ABNORMAL LOW (ref 36.0–46.0)
Hemoglobin: 6 g/dL — CL (ref 12.0–15.0)
MCH: 31.7 pg (ref 26.0–34.0)
MCHC: 33.1 g/dL (ref 30.0–36.0)
MCV: 95.8 fL (ref 80.0–100.0)
PLATELETS: 262 10*3/uL (ref 150–400)
RBC: 1.89 MIL/uL — ABNORMAL LOW (ref 3.87–5.11)
RDW: 13.5 % (ref 11.5–15.5)
WBC: 12.8 10*3/uL — AB (ref 4.0–10.5)

## 2018-03-17 LAB — PREPARE RBC (CROSSMATCH)

## 2018-03-17 MED ORDER — METHYLERGONOVINE MALEATE 0.2 MG PO TABS: 0.2000 mg | ORAL_TABLET | Freq: Four times a day (QID) | ORAL | Status: DC

## 2018-03-17 MED ORDER — SODIUM CHLORIDE 0.9% IV SOLUTION
Freq: Once | INTRAVENOUS | Status: AC
  Administered 2018-03-17: 08:00:00 via INTRAVENOUS

## 2018-03-17 MED ORDER — FUROSEMIDE 10 MG/ML IJ SOLN
20.0000 mg | Freq: Once | INTRAMUSCULAR | Status: AC
  Administered 2018-03-17: 20 mg via INTRAVENOUS
  Filled 2018-03-17 (×2): qty 2

## 2018-03-17 MED ORDER — AMLODIPINE BESYLATE 5 MG PO TABS
5.0000 mg | ORAL_TABLET | Freq: Every day | ORAL | Status: DC
  Administered 2018-03-17 – 2018-03-18 (×2): 5 mg via ORAL
  Filled 2018-03-17 (×2): qty 1

## 2018-03-17 MED ORDER — METHYLERGONOVINE MALEATE 0.2 MG PO TABS
0.2000 mg | ORAL_TABLET | Freq: Four times a day (QID) | ORAL | Status: DC
  Administered 2018-03-17 – 2018-03-18 (×6): 0.2 mg via ORAL
  Filled 2018-03-17 (×6): qty 1

## 2018-03-17 MED ORDER — PRENATAL MULTIVITAMIN CH
1.0000 | ORAL_TABLET | Freq: Every day | ORAL | Status: DC
  Administered 2018-03-17 – 2018-03-18 (×2): 1 via ORAL
  Filled 2018-03-17 (×3): qty 1

## 2018-03-17 MED ORDER — SODIUM CHLORIDE 0.9 % IV SOLN
3.0000 g | Freq: Four times a day (QID) | INTRAVENOUS | Status: DC
  Administered 2018-03-17 – 2018-03-18 (×5): 3 g via INTRAVENOUS
  Filled 2018-03-17 (×6): qty 3

## 2018-03-17 MED ORDER — OXYCODONE-ACETAMINOPHEN 5-325 MG PO TABS: 1.0000 | ORAL_TABLET | Freq: Once | ORAL | Status: DC

## 2018-03-17 NOTE — Progress Notes (Addendum)
OB Note   CBC Latest Ref Rng & Units 03/17/2018 03/17/2018 03/16/2018  WBC 4.0 - 10.5 K/uL - 12.8(H) 21.1(H)  Hemoglobin 12.0 - 15.0 g/dL 9.9(L) 6.0(LL) 8.3(L)  Hematocrit 36.0 - 46.0 % 29.1(L) 18.1(L) 24.7(L)  Platelets 150 - 400 K/uL - 262 314   Appropriate rise, minimal bleeding and belly feels much better.   I d/w her that I looked at the images myself and not convincing for retained POCs. I told her I recommend doing another u/s today to see if what they were seeing is still there and if it looks like retained tissue. I also recommend keeping her on IV abx for 24hrs from last temp (11/125 @ 0245 38.1); pt denies any breast s/s and exam negative. If afebrile, negative [name] and rpt cbc in am appropriate then would be fine for d/c to home tomorrow. Pt amenable to plan.    Patient Vitals for the past 6 hrs:  BP Temp Temp src Pulse Resp SpO2 Height Weight  03/17/18 1216 110/81 98.4 F (36.9 C) Oral 94 18 100 % - -  03/17/18 0808 112/84 98.9 F (37.2 C) Oral 90 18 100 % 5\' 4"  (1.626 m) 76.2 kg   VS acceptable will restart her norvasc but at 5 instead of Lafferty, Jr MD [facility] [employer] [facility] (Faculty Practice) 03/17/2018 Time: [phone]

## 2018-03-17 NOTE — Plan of Care (Signed)
  Problem: Education: Goal: Knowledge of General Education information will improve Description Including pain rating scale, medication(s)/side effects and non-pharmacologic comfort measures Outcome: Completed/Met

## 2018-03-17 NOTE — Progress Notes (Signed)
Post Partum Day 12, admitted for postpartum subinvolution/hemorrhage/endometritis Subjective: bleeding is dramatically less, cramping now less as well Feels better, 2nd unit almost in   Objective: Blood pressure 120/80, pulse (!) 109, temperature 98.9 F (37.2 C), temperature source Oral, resp. rate 17, SpO2 100 %, currently breastfeeding.  Physical Exam:  General: alert, cooperative and no distress Lochia: appropriate Uterine Fundus: firm Incision:  DVT Evaluation: No evidence of DVT seen on physical exam.  Recent Labs    03/16/18 2039 03/17/18 0109  HGB 8.3* 6.0*  HCT 24.7* 18.1*    Assessment/Plan: PPD#12 with uterine sunbinvolution/probable endometritis  S/P IV pitocin, TXA., methergine and cytotec No evidence of retained placnetal fragment but that remains in the differential Pt has finally responded with evacuation of the uterus followed by minimal vaginal bleeding S/P 2 units, if bleeding remains apporopriate and H/H stable should be able to be discharged home   Home meds: cytotec, augmentin  Discussed fully with pt who understands and all questions were answered   LOS: 0 days   Florian Buff 03/17/2018, 8:01 AM

## 2018-03-17 NOTE — Lactation Note (Signed)
Lactation Consultation Note  Patient Name: Lindsey Sellers [id number] Date: 03/17/2018  Mom is 12 days post partum and has been readmitted for increased vaginal bleeding.  Patient had a D&E and bleeding has improved.  Mom was seen as an outpatient in lactation on 11/21 and following the plan as much as possible.  Mom is putting baby to breast then post pumping most feeds.  Baby is then supplemented with expressed milk and formula.  She obtained 30 mls with last pumping.  Instructed to increase pumping frequency if feeling up to it.  She has a follow up lactation appointment tomorrow.  Instructed to reschedule for later in the week.   Maternal Data    Feeding    LATCH Score                   Interventions    Lactation Tools Discussed/Used     Consult Status      Ave Filter 03/17/2018, 9:46 AM

## 2018-03-17 NOTE — H&P (Signed)
Chief Complaint: Vaginal Bleeding   First Provider Initiated Contact with Patient 03/16/18 1903     SUBJECTIVE HPI: Lindsey Sellers is a 32 y.o. G1P1001 at 11 days S/P SVD complicated by 3rd degree perineal laceration and postpartum hemorrhage 1062 ml requiring blood transfusion who presents to Maternity Admissions reporting heavy vaginal bleeding and passage or a large clot one hour ago. Also had episode of heavy bleeding 03/09/18 and was Dx'd w/ PP GHTN. Started on Norvasc 10 mg QD.   Associated signs and symptoms: neg for fever, chills, abd pain, dizziness.   Past Medical History:  Diagnosis Date  . Abnormal pap 04/2006  . 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 Medical history non-contributory    OB History  Gravida Para Term Preterm AB Living  1 1 1  0 0 1  SAB TAB Ectopic Multiple Live Births  0 0 0 0 1    # Outcome Date GA Lbr Len/2nd Weight Sex Delivery Anes PTL Lv  1 Term 03/05/18 [redacted]w[redacted]d 17:27 / 02:00 3782 g M Vag-Spont None  LIV     Birth Comments: WNL   Past Surgical History:  Procedure Laterality Date  . COLPOSCOPY  2008   no biopsy done  . WISDOM TOOTH EXTRACTION     Social History   Socioeconomic History  . Marital status: Married    Spouse name: Not on file  . Number of children: Not on file  . Years of education: Not on file  . Highest education level: Not on file  Occupational History  . Not on file  Social Needs  . Financial resource strain: Not on file  . Food insecurity:    Worry: Not on file    Inability: Not on file  . Transportation needs:    Medical: Not on file    Non-medical: Not on file  Tobacco Use  . Smoking status: Never Smoker  . Smokeless tobacco: Never Used  Substance and Sexual Activity  . Alcohol use: Not Currently    Alcohol/week: 5.0 standard drinks    Types: 2 Glasses of wine, 3 Standard drinks or equivalent per week  . Drug use: No  . Sexual activity: Not Currently    Partners:  Male  Lifestyle  . Physical activity:    Days per week: Not on file    Minutes per session: Not on file  . Stress: Not on file  Relationships  . Social connections:    Talks on phone: Not on file    Gets together: Not on file    Attends religious service: Not on file    Active member of club or organization: Not on file    Attends meetings of clubs or organizations: Not on file    Relationship status: Not on file  . Intimate partner violence:    Fear of current or ex partner: Not on file    Emotionally abused: Not on file    Physically abused: Not on file    Forced sexual activity: Not on file  Other Topics Concern  . 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 Cancer Paternal Grandmother  lymph node  . Gout Father   . Colon cancer Neg Hx   . Breast cancer Neg Hx    No current facility-administered medications on file prior to encounter.    Current Outpatient Medications on File Prior to Encounter  Medication Sig Dispense Refill  . amLODipine (NORVASC) 10 MG tablet Take 1 tablet (10 mg total) by mouth daily. 30 tablet 1  . Ferrous Fumarate (HEMOCYTE - 106 MG FE) 324 (106 Fe) MG TABS tablet Take 1 tablet (106 mg of iron total) by mouth 2 (two) times daily. 30 tablet 1  . Prenatal Vit-Fe Fumarate-FA (PRENATAL VITAMIN PO) Take by mouth.    Marland Kitchen ibuprofen (ADVIL,MOTRIN) 600 MG tablet Take 1 tablet (600 mg total) by mouth every 6 (six) hours. 30 tablet 0  . norethindrone (ORTHO MICRONOR) 0.35 MG tablet Take 1 tablet (0.35 mg total) by mouth daily. 1 Package 11  . senna-docusate (SENOKOT-S) 8.6-50 MG tablet Take 2 tablets by mouth daily. For 2-3 weeks 45 tablet 1   No Known Allergies  I have reviewed patient's Past Medical Hx, Surgical Hx,  Family Hx, Social Hx, medications and allergies.   Review of Systems  Constitutional: Negative for chills and fever.  Gastrointestinal: Negative for abdominal pain.  Genitourinary: Positive for vaginal bleeding.  Neurological: Negative for dizziness and syncope.    OBJECTIVE Patient Vitals for the past 24 hrs:  BP Temp src Pulse Resp SpO2  03/16/18 2116 116/74 - (!) 137 - -  03/16/18 2102 122/81 - (!) 140 - -  03/16/18 2032 126/75 - (!) 132 - -  03/16/18 2015 140/80 - (!) 132 - 99 %  03/16/18 2001 127/83 - (!) 116 - -  03/16/18 1946 133/88 - (!) 115 - -  03/16/18 1931 123/78 - (!) 120 - -  03/16/18 1917 (!) 135/112 - (!) 111 - -  03/16/18 1905 (!) 130/97 Oral (!) 144 18 -   Constitutional: Well-developed, well-nourished female in no acute distress. Anxious.  Skin: pallor Cardiovascular: tachycardic  Respiratory: normal rate and effort.  GI: Abd soft, non-tender, fundus 4/SP, decreased to 2/SP after expressing clots.  MS: Extremities nontender, no edema, normal ROM Neurologic: Alert and oriented x 4.  GU: Moderate amount of BRB and large clots on pad. Small amount of active bleeding after clots expressed.   LAB RESULTS Results for orders placed or performed during the hospital encounter of 03/16/18 (from the past 24 hour(s))  CBC     Status: Abnormal   Collection Time: 03/16/18  7:27 PM  Result Value Ref Range   WBC 8.6 4.0 - 10.5 K/uL   RBC 3.00 (L) 3.87 - 5.11 MIL/uL   Hemoglobin 9.4 (L) 12.0 - 15.0 g/dL   HCT 28.9 (L) 36.0 - 46.0 %   MCV 96.3 80.0 - 100.0 fL   MCH 31.3 26.0 - 34.0 pg   MCHC 32.5 30.0 - 36.0 g/dL   RDW 13.9 11.5 - 15.5 %   Platelets 272 150 - 400 K/uL   nRBC 0.0 0.0 - 0.2 %  Comprehensive metabolic panel     Status: Abnormal   Collection Time: 03/16/18  7:27 PM  Result Value Ref Range   Sodium 138 135 - 145 mmol/L   Potassium 3.5 3.5 - 5.1 mmol/L   Chloride 107 98 - 111 mmol/L   CO2 22 22 - 32 mmol/L   Glucose, Bld 113 (H) 70 - 99 mg/dL   BUN 10  6 - 20 mg/dL   Creatinine, Ser  0.51 0.44 - 1.00 mg/dL   Calcium 8.1 (L) 8.9 - 10.3 mg/dL   Total Protein 5.6 (L) 6.5 - 8.1 g/dL   Albumin 3.0 (L) 3.5 - 5.0 g/dL   AST 20 15 - 41 U/L   ALT 35 0 - 44 U/L   Alkaline Phosphatase 70 38 - 126 U/L   Total Bilirubin 0.4 0.3 - 1.2 mg/dL   GFR calc non Af Amer >60 >60 mL/min   GFR calc Af Amer >60 >60 mL/min   Anion gap 9 5 - 15  Type and screen     Status: None   Collection Time: 03/16/18  7:27 PM  Result Value Ref Range   ABO/RH(D) A POS    Antibody Screen NEG    Sample Expiration      03/19/2018 Performed at [facility], [address] [place], [place] [address]   CBC     Status: Abnormal   Collection Time: 03/16/18  8:39 PM  Result Value Ref Range   WBC 21.1 (H) 4.0 - 10.5 K/uL   RBC 2.57 (L) 3.87 - 5.11 MIL/uL   Hemoglobin 8.3 (L) 12.0 - 15.0 g/dL   HCT 24.7 (L) 36.0 - 46.0 %   MCV 96.1 80.0 - 100.0 fL   MCH 32.3 26.0 - 34.0 pg   MCHC 33.6 30.0 - 36.0 g/dL   RDW 13.9 11.5 - 15.5 %   Platelets 314 150 - 400 K/uL   nRBC 0.0 0.0 - 0.2 %   MAU COURSE Orders Placed This Encounter  Procedures  . US PELVIS (TRANSABDOMINAL ONLY)  . CBC  . Comprehensive metabolic panel  . CBC  . Diet NPO time specified  . Type and screen   Meds ordered this encounter  Medications  . sodium chloride 0.9 % bolus 1,000 mL  . misoprostol (CYTOTEC) tablet 800 mcg  . methylergonovine (METHERGINE) injection 0.2 mg  . tranexamic acid (CYKLOKAPRON) IVPB 1,000 mg  . oxytocin (PITOCIN) IV infusion 40 units in LR 1000 mL - Premix  . Ampicillin-Sulbactam (UNASYN) 3 g in sodium chloride 0.9 % 100 mL IVPB    Order Specific Question:   Antibiotic Indication:    Answer:   Other Indication (list below)    Order Specific Question:   Other Indication:    Answer:   endometritis   Care of pt turned over to Len Blalock, CNM at 2000.  V. Smith CNM  US Pelvis (transabdominal Only)  Result Date: 03/16/2018 CLINICAL DATA:  Heavy vaginal bleeding. 11  days postpartum from vaginal delivery. EXAM: TRANSABDOMINAL ULTRASOUND OF PELVIS TECHNIQUE: Transabdominal ultrasound examination of the pelvis was performed including evaluation of the uterus, ovaries, adnexal regions, and pelvic cul-de-sac. COMPARISON:  None. FINDINGS: Uterus Measurements: 15.0 x 7.1 x 8.4 cm = volume: 472 mL. No fibroids identified. Endometrium Thickness: Diffuse endometrial thickening is seen measuring up to 48 mm. Internal blood flow is seen within soft tissue density in lower uterine segment, consistent with retained products conception. Right ovary Measurements: 2.8 x 1.3 x 1.8 cm = volume: 3.3 mL. Normal appearance/no adnexal mass. Left ovary Measurements: 3.5 x 1.3 x 1.6 cm = volume: 3.8 mL. Normal appearance/no adnexal mass. Other findings:  No abnormal free fluid. IMPRESSION: Diffuse thickening of endometrium with internal vascularity in lower uterine segment, consistent with retained products of conception. Electronically Signed   By: Earle Gell M.D.   On: 03/16/2018 20:52   EBL 700 at this time. Consulted with Dr. Elonda Husky- will give pitocin bolus, TXA  and methergine.   Repeat CBC- WBC 21.1 EBL 1060. Dr Elonda Husky updated with EBL and WBC- no large clots passed yet and bleeding approximately the same. Will give IV Unasyn for suspected endometritis while observing bleeding.  Repeat speculum exam- several small clots and bright red blood removed from vault. Large clot approximately the size of an orange noted at cervical os and removed. Cervix appeared to return to closed and only a small trickle of blood noted. Will observe bleeding while IV fluids finish.   Attempted to ambulate patient to bathroom. Patient felt dizzy like she was going to pass out after standing. Patient laid back in bed and felt better. She has not eaten since lunch so will give some food and try again after patient eats.  Attempted to have patient ambulate to the bathroom. Patient unable to stand without feeling  like she was going to pass out. Pulse up to 160s while standing. Bleeding minimal.  Repeat CBC  Hbg 6.0. Dr. Elonda Husky updated on patient's inability to ambulate and Hgb- will admit to [facility] for blood administration.   1. Postpartum hemorrhage, delayed (> 24 hrs)   2. Symptomatic anemia    -Admit to [facility] -2U PRBCs ordered -Lasix to be given after second unit of blood -Oral methergine -Care turned over to MD.  Wende Mott, CNM 03/17/18 1:36 AM

## 2018-03-18 DIAGNOSIS — O8612 Endometritis following delivery: Secondary | ICD-10-CM | POA: Diagnosis not present

## 2018-03-18 DIAGNOSIS — Z79899 Other long term (current) drug therapy: Secondary | ICD-10-CM | POA: Diagnosis not present

## 2018-03-18 DIAGNOSIS — N8500 Endometrial hyperplasia, unspecified: Secondary | ICD-10-CM | POA: Diagnosis not present

## 2018-03-18 DIAGNOSIS — D591 Other autoimmune hemolytic anemias: Secondary | ICD-10-CM | POA: Diagnosis not present

## 2018-03-18 LAB — BPAM RBC
BLOOD PRODUCT EXPIRATION DATE: 201912032359
BLOOD PRODUCT EXPIRATION DATE: 201912042359
ISSUE DATE / TIME: 201911250244
ISSUE DATE / TIME: 201911250531
UNIT TYPE AND RH: 600
Unit Type and Rh: 9500

## 2018-03-18 LAB — TYPE AND SCREEN
ABO/RH(D): A POS
Antibody Screen: NEGATIVE
UNIT DIVISION: 0
Unit division: 0

## 2018-03-18 LAB — HEMOGLOBIN AND HEMATOCRIT, BLOOD
HEMATOCRIT: 34.2 % — AB (ref 36.0–46.0)
HEMOGLOBIN: 11.2 g/dL — AB (ref 12.0–15.0)

## 2018-03-18 LAB — CBC
HCT: 25.8 % — ABNORMAL LOW (ref 36.0–46.0)
HEMOGLOBIN: 8.8 g/dL — AB (ref 12.0–15.0)
MCH: 30.9 pg (ref 26.0–34.0)
MCHC: 34.1 g/dL (ref 30.0–36.0)
MCV: 90.5 fL (ref 80.0–100.0)
Platelets: 193 10*3/uL (ref 150–400)
RBC: 2.85 MIL/uL — AB (ref 3.87–5.11)
RDW: 17 % — ABNORMAL HIGH (ref 11.5–15.5)
WBC: 6.8 10*3/uL (ref 4.0–10.5)
nRBC: 0 % (ref 0.0–0.2)

## 2018-03-18 MED ORDER — AMLODIPINE BESYLATE 10 MG PO TABS: 5.0000 mg | ORAL_TABLET | Freq: Every day | ORAL | 0 refills | Status: DC

## 2018-03-18 NOTE — Discharge Instructions (Signed)
Endometritis Endometritis is irritation, soreness, or inflammation that affects the lining of the uterus (endometrium). Infection is usually the cause of endometritis. It is important to get treatment to prevent complications. Common complications may include more severe infections and not being able to have children(infertility). What are the causes? This condition may be caused by:  Bacterial infections.  STIs (sexually transmitted infections).  A miscarriage or childbirth, especially after a long labor or cesarean delivery.  Certain gynecological procedures. These may include dilation and curettage (D&C), hysteroscopy, or birth control (contraceptive) insertion.  Tuberculosis (TB).  What are the signs or symptoms? Symptoms of this condition include:  Fever.  Lower abdomen (abdominal) pain.  Pelvis (pelvic) pain.  Abnormal vaginal discharge or bleeding.  Abdominal bloating (distention) or swelling.  General discomfort or generally feeling ill.  Discomfort with bowel movements.  Constipation.  How is this diagnosed? This condition may be diagnosed based on:  A physical exam, including a pelvic exam.  Tests, such as: ? Blood tests. ? Removal of a sample of endometrial tissue for testing (endometrial biopsy). ? Examining a sample of vaginal discharge under a microscope (wet prep). ? Removal of a sample of fluid from the cervix for testing (cervical culture). ? Surgical examination of the pelvis and abdomen.  How is this treated? This condition is treated with:  Antibiotic medicines.  For more severe cases, hospitalization may be needed to give fluids and antibiotics directly into a vein through an IV tube.  Follow these instructions at home:  Take over-the-counter and prescription medicines only as told by your health care provider.  Drink enough fluid to keep your urine clear or pale yellow.  Take your antibiotic medicine as told by your health care  provider. Do not stop taking the antibiotic even if you start to feel better.  Do not douche or have sex (including vaginal, oral, and anal sex) until your health care provider approves.  If your endometritis was caused by an STI, do not have sex (including vaginal, oral, and anal sex) until your partner has also been treated for the STI.  Return to your normal activities as told by your health care provider. Ask your health care provider what activities are safe for you.  Keep all follow-up visits as told by your health care provider. This is important. Contact a health care provider if:  You have pain that does not get better with medicine.  You have a fever.  You have pain with bowel movements. Get help right away if:  You have abdominal swelling.  You have abdominal pain that gets worse.  You have bad-smelling vaginal discharge, or an increased amount of vaginal discharge.  You have abnormal vaginal bleeding.  You have nausea and vomiting. Summary  Endometritis affects the lining of the uterus (endometrium) and is usually caused by an infection.  It is important to get treatment to prevent complications.  You have several treatment options for endometritis. Treatment may include antibiotics and IV fluids.  Take your antibiotic medicine as told by your health care provider. Do not stop taking the antibiotic even if you start to feel better.  Do not douche or have sex (including vaginal, oral, and anal sex) until your health care provider approves. This information is not intended to replace advice given to you by your health care provider. Make sure you discuss any questions you have with your health care provider. Document Released: 04/03/2001 Document Revised: 04/24/2016 Document Reviewed: 04/24/2016 Elsevier Interactive Patient Education  2017  [employer]vier Inc[employer]   Anemia What are the signs or symptoms? Symptoms of this condition include:  Minor  weakness.  Dizziness.  Headache.  Feeling heartbeats that are irregular or faster than normal (palpitations).  Shortness of breath, especially with exercise.  Paleness.  Cold sensitivity.  Indigestion.  Nausea.  Difficulty sleeping.  Difficulty concentrating.  Symptoms may occur suddenly or develop slowly. If your anemia is mild, you may not have symptoms.  Follow these instructions at home:  Take over-the-counter and prescription medicines only as told by your health care provider.  Take supplements only as told by your health care provider.  Follow any diet instructions that you were given.  Keep all follow-up visits as told by your health care provider. This is important. Contact a health care provider if:  You develop new bleeding anywhere in the body. Get help right away if:  You are very weak.  You are short of breath.  You have pain in your abdomen or chest.  You are dizzy or feel faint.  You have trouble concentrating.  You have bloody or black, tarry stools.  You vomit repeatedly or you vomit up blood. Summary  Anemia is a condition in which you do not have enough red blood cells or enough of a substance in your red blood cells that carries oxygen (hemoglobin).  Symptoms may occur suddenly or develop slowly.  If your anemia is mild, you may not have symptoms.  This condition is diagnosed with blood tests as well as a medical history and physical exam. Other tests may be needed.  Treatment for this condition depends on the cause of the anemia. This information is not intended to replace advice given to you by your health care provider. Make sure you discuss any questions you have with your health care provider. Document Released: 05/17/2004 Document Revised: 05/11/2016 Document Reviewed: 05/11/2016 Elsevier Interactive Patient Education  [employer].

## 2018-03-19 ENCOUNTER — Other Ambulatory Visit: Payer: Self-pay | Admitting: *Deleted

## 2018-03-19 ENCOUNTER — Ambulatory Visit: Payer: Self-pay

## 2018-03-19 DIAGNOSIS — R52 Pain, unspecified: Secondary | ICD-10-CM

## 2018-03-19 NOTE — Discharge Summary (Signed)
Discharge Summary   Admit Date: 03/16/2018 Discharge Date: 03/19/2018 Discharging Service: Obstetrics  Primary O[facility]: [facility] Admitting Physician: Florian Buff, MD  Discharge Physician: Ilda Basset  Referring Provider: Maternity Admissions Unit  Primary Care Provider: Inda Coke, [place]  Admission Diagnoses: *Delayed postpartum hemorrhage *Symptomatic anemia *Endometritis  Discharge Diagnoses: *Resolved delayed PP hemorrhage,  symptomatic anemia, endometritis, Consult Orders: None   Surgeries/Procedures Performed: 2U PRBCs Transabdominal ultrasound  History and Physical: Attestation signed by Florian Buff, MD at 03/17/2018 7:53 AM  Attestation of Attending Supervision of Advanced Practitioner (CNM/NP/PA): Evaluation and management procedures were performed by the Advanced Practitioner under my supervision and collaboration. I have reviewed the Advanced Practitioner's note and chart, and I agree with the management and plan.  Jacelyn Grip MD Attending Physician for the [facility] 03/17/2018 7:53 AM        Show:Clear all [] Manual[] Template[x] Copied  Added by: [x] Wende Mott, CNM  [] Hover for details Chief Complaint: Vaginal Bleeding   First Provider Initiated Contact with Patient 03/16/18 1903     SUBJECTIVE HPI: Lindsey Sellers is a 32 y.o. G1P1001 at 11 days S/P SVD complicated by 3rd degree perineal laceration and postpartum hemorrhage 1062 ml requiring blood transfusion who presents to Maternity Admissions reporting heavy vaginal bleeding and passage or a large clot one hour ago. Also had episode of heavy bleeding 03/09/18 and was Dx'd w/ PP GHTN. Started on Norvasc 10 mg QD.   Associated signs and symptoms: neg for fever, chills, abd pain, dizziness.       Past Medical History:  Diagnosis Date  . Abnormal pap 04/2006  . 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 Medical history non-contributory                    OB History  Gravida Para Term Preterm AB Living  1 1 1  0 0 1  SAB TAB Ectopic Multiple Live Births     0 0 0 0 1       # Outcome Date GA Lbr Len/2nd Weight Sex Delivery Anes PTL Lv  1 Term 03/05/18 [redacted]w[redacted]d 17:27 / 02:00 3782 g M Vag-Spont None  LIV     Birth Comments: WNL        Past Surgical History:  Procedure Laterality Date  . COLPOSCOPY  2008   no biopsy done  . WISDOM TOOTH EXTRACTION     Social History   Socioeconomic History  . Marital status: Married    Spouse name: Not on file  . Number of children: Not on file  . Years of education: Not on file  . Highest education level: Not on file  Occupational History  . Not on file  Social Needs  . Financial resource strain: Not on file  . Food insecurity:    Worry: Not on file    Inability: Not on file  . Transportation needs:    Medical: Not on file    Non-medical: Not on file  Tobacco Use  . Smoking status: Never Smoker  . Smokeless tobacco: Never Used  Substance and Sexual Activity  . Alcohol use: Not Currently    Alcohol/week: 5.0 standard drinks    Types: 2 Glasses of wine, 3 Standard drinks or equivalent per week  . Drug use: No  . Sexual activity: Not Currently    Partners: Male  Lifestyle  . Physical activity:    Days per week:  Not on file    Minutes per session: Not on file  . Stress: Not on file  Relationships  . Social connections:    Talks on phone: Not on file    Gets together: Not on file    Attends religious service: Not on file    Active member of club or organization: Not on file    Attends meetings of clubs or organizations: Not on file    Relationship status: Not on file  . Intimate partner violence:    Fear of current or ex partner: Not on file    Emotionally abused: Not on file    Physically abused: Not on file    Forced sexual activity: Not on file  Other  Topics Concern  . 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 Cancer Paternal Grandmother        lymph node  . Gout Father   . Colon cancer Neg Hx   . Breast cancer Neg Hx    No current facility-administered medications on file prior to encounter.          Current Outpatient Medications on File Prior to Encounter  Medication Sig Dispense Refill  . amLODipine (NORVASC) 10 MG tablet Take 1 tablet (10 mg total) by mouth daily. 30 tablet 1  . Ferrous Fumarate (HEMOCYTE - 106 MG FE) 324 (106 Fe) MG TABS tablet Take 1 tablet (106 mg of iron total) by mouth 2 (two) times daily. 30 tablet 1  . Prenatal Vit-Fe Fumarate-FA (PRENATAL VITAMIN PO) Take by mouth.    Marland Kitchen ibuprofen (ADVIL,MOTRIN) 600 MG tablet Take 1 tablet (600 mg total) by mouth every 6 (six) hours. 30 tablet 0  . norethindrone (ORTHO MICRONOR) 0.35 MG tablet Take 1 tablet (0.35 mg total) by mouth daily. 1 Package 11  . senna-docusate (SENOKOT-S) 8.6-50 MG tablet Take 2 tablets by mouth daily. For 2-3 weeks 45 tablet 1   No Known Allergies  I have reviewed patient's Past Medical Hx, Surgical Hx, Family Hx, Social Hx, medications and allergies.   Review of Systems  Constitutional: Negative for chills and fever.  Gastrointestinal: Negative for abdominal pain.  Genitourinary: Positive for vaginal bleeding.  Neurological: Negative for dizziness and syncope.    OBJECTIVE Patient Vitals for the past 24 hrs:  BP Temp src Pulse Resp SpO2  03/16/18 2116 116/74 - (!) 137 - -  03/16/18 2102 122/81 - (!) 140 - -  03/16/18 2032 126/75 - (!) 132 - -  03/16/18 2015 140/80 - (!) 132 - 99 %  03/16/18 2001 127/83 - (!) 116 - -  03/16/18 1946 133/88 - (!) 115 - -  03/16/18 1931  123/78 - (!) 120 - -  03/16/18 1917 (!) 135/112 - (!) 111 - -  03/16/18 1905 (!) 130/97 Oral (!) 144 18 -   Constitutional: Well-developed, well-nourished female in no acute distress. Anxious.  Skin: pallor Cardiovascular: tachycardic  Respiratory: normal rate and effort.  GI: Abd soft, non-tender, fundus 4/SP, decreased to 2/SP after expressing clots.  MS: Extremities nontender, no edema, normal ROM Neurologic: Alert and oriented x 4.  GU: Moderate amount of BRB  and large clots on pad. Small amount of active bleeding after clots expressed.   LAB RESULTS       Results for orders placed or performed during the hospital encounter of 03/16/18 (from the past 24 hour(s))  CBC     Status: Abnormal   Collection Time: 03/16/18  7:27 PM  Result Value Ref Range   WBC 8.6 4.0 - 10.5 K/uL   RBC 3.00 (L) 3.87 - 5.11 MIL/uL   Hemoglobin 9.4 (L) 12.0 - 15.0 g/dL   HCT 28.9 (L) 36.0 - 46.0 %   MCV 96.3 80.0 - 100.0 fL   MCH 31.3 26.0 - 34.0 pg   MCHC 32.5 30.0 - 36.0 g/dL   RDW 13.9 11.5 - 15.5 %   Platelets 272 150 - 400 K/uL   nRBC 0.0 0.0 - 0.2 %  Comprehensive metabolic panel     Status: Abnormal   Collection Time: 03/16/18  7:27 PM  Result Value Ref Range   Sodium 138 135 - 145 mmol/L   Potassium 3.5 3.5 - 5.1 mmol/L   Chloride 107 98 - 111 mmol/L   CO2 22 22 - 32 mmol/L   Glucose, Bld 113 (H) 70 - 99 mg/dL   BUN 10 6 - 20 mg/dL   Creatinine, Ser 0.51 0.44 - 1.00 mg/dL   Calcium 8.1 (L) 8.9 - 10.3 mg/dL   Total Protein 5.6 (L) 6.5 - 8.1 g/dL   Albumin 3.0 (L) 3.5 - 5.0 g/dL   AST 20 15 - 41 U/L   ALT 35 0 - 44 U/L   Alkaline Phosphatase 70 38 - 126 U/L   Total Bilirubin 0.4 0.3 - 1.2 mg/dL   GFR calc non Af Amer >60 >60 mL/min   GFR calc Af Amer >60 >60 mL/min   Anion gap 9 5 - 15  Type and screen     Status: None   Collection Time: 03/16/18  7:27 PM  Result Value Ref Range   ABO/RH(D) A POS    Antibody Screen NEG    Sample Expiration       03/19/2018 Performed at [facility], [address] [place],  [address]   CBC     Status: Abnormal   Collection Time: 03/16/18  8:39 PM  Result Value Ref Range   WBC 21.1 (H) 4.0 - 10.5 K/uL   RBC 2.57 (L) 3.87 - 5.11 MIL/uL   Hemoglobin 8.3 (L) 12.0 - 15.0 g/dL   HCT 24.7 (L) 36.0 - 46.0 %   MCV 96.1 80.0 - 100.0 fL   MCH 32.3 26.0 - 34.0 pg   MCHC 33.6 30.0 - 36.0 g/dL   RDW 13.9 11.5 - 15.5 %   Platelets 314 150 - 400 K/uL   nRBC 0.0 0.0 - 0.2 %   MAU COURSE    Orders Placed This Encounter  Procedures  . US PELVIS (TRANSABDOMINAL ONLY)  . CBC  . Comprehensive metabolic panel  . CBC  . Diet NPO time specified  . Type and screen        Meds ordered this encounter  Medications  . sodium chloride 0.9 % bolus 1,000 mL  . misoprostol (CYTOTEC) tablet 800 mcg  . methylergonovine (METHERGINE) injection 0.2 mg  . tranexamic acid (CYKLOKAPRON) IVPB 1,000 mg  . oxytocin (PITOCIN) IV infusion 40 units in LR 1000 mL - Premix  . Ampicillin-Sulbactam (UNASYN) 3 g in sodium chloride 0.9 % 100 mL IVPB    Order Specific Question:   Antibiotic  Indication:    Answer:   Other Indication (list below)    Order Specific Question:   Other Indication:    Answer:   endometritis   Care of pt turned over to Len Blalock, CNM at 2000.  V. Smith CNM  US Pelvis (transabdominal Only)  Result Date: 03/16/2018 CLINICAL DATA:  Heavy vaginal bleeding. 11 days postpartum from vaginal delivery. EXAM: TRANSABDOMINAL ULTRASOUND OF PELVIS TECHNIQUE: Transabdominal ultrasound examination of the pelvis was performed including evaluation of the uterus, ovaries, adnexal regions, and pelvic cul-de-sac. COMPARISON:  None. FINDINGS: Uterus Measurements: 15.0 x 7.1 x 8.4 cm = volume: 472 mL. No fibroids identified. Endometrium Thickness: Diffuse endometrial thickening is seen measuring up to 48 mm. Internal blood flow is seen within soft tissue density in lower  uterine segment, consistent with retained products conception. Right ovary Measurements: 2.8 x 1.3 x 1.8 cm = volume: 3.3 mL. Normal appearance/no adnexal mass. Left ovary Measurements: 3.5 x 1.3 x 1.6 cm = volume: 3.8 mL. Normal appearance/no adnexal mass. Other findings:  No abnormal free fluid. IMPRESSION: Diffuse thickening of endometrium with internal vascularity in lower uterine segment, consistent with retained products of conception. Electronically Signed   By: Earle Gell M.D.   On: 03/16/2018 20:52   EBL 700 at this time. Consulted with Dr. Elonda Husky- will give pitocin bolus, TXA and methergine.   Repeat CBC- WBC 21.1 EBL 1060. Dr Elonda Husky updated with EBL and WBC- no large clots passed yet and bleeding approximately the same. Will give IV Unasyn for suspected endometritis while observing bleeding.  Repeat speculum exam- several small clots and bright red blood removed from vault. Large clot approximately the size of an orange noted at cervical os and removed. Cervix appeared to return to closed and only a small trickle of blood noted. Will observe bleeding while IV fluids finish.   Attempted to ambulate patient to bathroom. Patient felt dizzy like she was going to pass out after standing. Patient laid back in bed and felt better. She has not eaten since lunch so will give some food and try again after patient eats.  Attempted to have patient ambulate to the bathroom. Patient unable to stand without feeling like she was going to pass out. Pulse up to 160s while standing. Bleeding minimal.  Repeat CBC  Hbg 6.0. Dr. Elonda Husky updated on patient's inability to ambulate and Hgb- will admit to [facility] for blood administration.   1. Postpartum hemorrhage, delayed (> 24 hrs)   2. Symptomatic anemia    -Admit to [facility] -2U PRBCs ordered -Lasix to be given after second unit of blood -Oral methergine -Care turned over to MD.  Wende Mott, CNM 03/17/18 1:36 AM      Hospital Course: Patient  received 2U PRBCs, a dose of IM methergine and then on PO q6h methergine during her hospitalization; she also received a 40U bag of pitocin and a dose of Lysteda IV.  She was on Unasyn during her hospital course and by the time of her discharge she had been afebrile for at least 24 hours, her CBC responded well, her bleeding was minimal and she was feeling better in her s/s. She received a 2nd transabdominal u/s prior to her discharge b/c the first had concerns for POCs even though on my review it didn't and her 2nd one showed a smaller uterus with contents of uterus more c/w a regular PP uterus.   CLINICAL DATA:  Postpartum hemorrhage, evaluate for retained products of conception  EXAM: LIMITED  ULTRASOUND OF PELVIS  TECHNIQUE: Limited transabdominal ultrasound examination of the pelvis was performed.  COMPARISON:  Pelvic ultrasound dated 03/16/2018  FINDINGS: Uterus measures 12.6 x 6.5 x 6.9 cm (calculated volume 297 mL).  Debris/hemorrhage/soft tissue in the endometrial canal, measuring 25 mm in thickness. Minimal vascularity.  No free fluid.  Bilateral ovaries were not evaluated.  IMPRESSION: Debris/hemorrhage/soft tissue in the endometrial canal, measuring 25 mm in thickness. In the immediate postpartum setting, this may reflect hemorrhage versus avascular retained products of conception.  Consider follow-up pelvic ultrasound in 10-14 days as clinically warranted.   Electronically Signed   By: Julian Hy M.D.   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   Discharge Exam:    Current Vital Signs 24h Vital Sign Ranges  T 98.2 F (36.8  C) No data recorded  BP 107/81 No data recorded  HR 90 No data recorded  RR 20 No data recorded  SaO2 100 % Room Air No data recorded       24 Hour I/O Current Shift I/O  Time Ins Outs No intake/output data recorded. No intake/output data recorded.   General appearance: Well nourished, well developed female in no acute distress.  Cardiovascular: S1, S2 normal, no murmur, rub or gallop, regular rate and rhythm Respiratory:  Clear to auscultation bilateral. Normal respiratory effort Abdomen: soft, nttp, nd.  Neuro/Psych:  Normal mood and affect.  Skin:  Warm and dry.   Discharge Disposition:  Home  Patient Instructions:  Standard   Results Pending at Discharge:  none  Discharge Medications: Allergies as of 03/18/2018   No Known Allergies     Medication List    STOP taking these medications   senna-docusate 8.6-50 MG tablet Commonly known as:  Senokot-S     TAKE these medications   amLODipine 10 MG tablet Commonly known as:  NORVASC Take 0.5 tablets (5 mg total) by mouth daily. What changed:  how much to take   Ferrous Fumarate 324 (106 Fe) MG Tabs tablet Commonly known as:  HEMOCYTE - 106 mg FE Take 1 tablet (106 mg of iron total) by mouth 2 (two) times daily.   norethindrone 0.35 MG tablet Commonly known as:  MICRONOR,CAMILA,ERRIN Take 1 tablet (0.35 mg total) by mouth daily.   PRENATAL VITAMIN PO Take by mouth.        Future Appointments  Date Time Provider [place]  03/25/2018  1:15 PM Leftwich-Kirby, Kathie Dike, CNM [facility]    Durene Romans MD Attending [facility])

## 2018-03-19 NOTE — Lactation Note (Signed)
This note was copied from a baby's chart. 03/19/2018  Name: Lindsey Sellers MRN: [id number] Date of Birth: 03/05/2020 Gestational Age: Gestational Age: [redacted]w[redacted]d Birth Weight: 133.4 oz Weight today:    8 pounds 9.6 ounces (3900 grams) with clean newborn diaper   Infant presents today with mom and GM for follow up feeding assessment.   Mom was readmitted to the hospital for continued bleeding and retained product. Mom's Hgb dropped and mom needed blood transfusion x 2. Mom was given antibiotics during her stay. Infant was being bottle fed while mom was in the hospital and mom was pumping.   Infant has gained 220 grams in the last 6 days with an average daily weight gain of 37 grams a day.   Infant is self awakening to feed and BF on both breasts for 15-20 minutes and then is being supplemented with pumped EBM and formula if he is still cueing to feed.   Infant with thick labial frenulum that inserts at the bottom of the gum ridge. Tongue with good mobility today. Infant with good tongue extension and strong suckle. Mom reports flanging his upper lip helps with pain of latch. Mom reports pain with initial latch that improves with feeding. Mom aware infant with lip restriction.   When mom pumps she is able to get an ounce and a half. She was pumping about 7-8 x a day and now since infant nursing again she is pumping 5-6 x a day post BF.   Mom used the 5 french feeding tube at the breast previously and found it to be difficult over time.   Discussed with mom that [place] and decreased breast growth are both potential factors in her lowered milk production. Reviewed Galactagogues with mom. Information given on Fenugreek and Mother Love More Milk Special Blend. Enc mom to try Mother Love More Milk Special Blend. Advised mom to speak with OB prior to taking. Mom is drinking Mother's Milk Tea currently.   Infant to follow up with Dr. Sharlene Motts in 2 weeks. Mom aware of BF Support Groups. Infant to follow up  with Lactation as needed. Mom would like to see how things are going and call back for follow up.   Mom reports that all questions have been answered at this time. Mom to call with any questions as needed.    General Information: Mother's reason for visit: Follow up feeding assessment Consult: Follow-up Lactation consultant: Nonah Mattes RN,IBCLC Breastfeeding experience: Latching well, still needing supplement Maternal medical conditions: Pregnancy induced hypertension, Post-partum hemorrhage(Blood transfusion x 4, retained placenta, PPH x 2, PIH post delivery, minimal breast growth wtih pregnancy) Maternal medications: Pre-natal vitamin, Iron, Motrin (ibuprofen), Other(Norvasc)  Breastfeeding History: Frequency of breast feeding: every 1-3 hours Duration of feeding: 15-20 minutes, both breasts with each feeding  Supplementation: Supplement method: bottle(Dr. Brown's) Brand: Similac Formula volume: 1-3 ounces Formula frequency: 8 x a day Total formula volume per day: 8-16 ounces Breast milk volume: with breast feeding, out of pumped milk at this time     Pump type: Medela pump in style Pump frequency: 5-6 x a day Pump volume: 1-1.5 ounces if he has not fed, <1/2 ounce post feeding  Infant Output Assessment: Voids per 24 hours: 7-8 Urine color: Clear yellow Stools per 24 hours: 1 every day to every other day Stool color: Yellow  Breast Assessment: Breast: Soft, Compressible, Widely spaced Nipple: Erect Pain level: 2 Pain interventions: Bra, Coconut oil, Breast pump, Expressed breast milk  Feeding Assessment: Infant oral  assessment: Variance Infant oral assessment comment: Infant with thick labial frenulum that inserts at the bottom of the gum ridge. Upper lip tight with flanging, mom needs to flange with each latching on the breast. infant with good tongue mobility today. Nipple rounded post feeding, mom reports increased comfort with feeding today.  Positioning: Presenter, broadcasting: 1 - Repeated attempts needed to sustain latch, nipple held in mouth throughout feeding, stimulation needed to elicit sucking reflex. Audible swallowing: 1 - A few with stimulation Type of nipple: 2 - Everted at rest and after stimulation Comfort: 2 - Soft/non-tender Hold: 2 - No assistance needed to correctly position infant at breast LATCH score: 8 Latch assessment: Deep Lips flanged: Yes Suck assessment: Displays both   Pre-feed weight: 3900 grams Post feed weight: 3910 grams Amount transferred: 10 ml Amount supplemented: 0  Additional Feeding Assessment: Infant oral assessment: Variance Infant oral assessment comment: see above Positioning: Cross cradle(left breast, 15 minutes) Latch: 1 - Repeated attempts neede to sustain latch, nipple held in mouth throughout feeding, stimulation needed to elicit sucking reflex. Audible swallowing: 1 - A few with stimulation Type of nipple: 2 - Everted at rest and after stimulation Comfort: 2 - Soft/non-tender Hold: 2 - No assistance needed to correctly position infant at breast LATCH score: 8 Latch assessment: Deep Lips flanged: Yes Suck assessment: Displays both   Pre-feed weight: 3910 grams Post feed weight: 3916 grams Amount transferred: 6 ml    Totals: Total amount transferred: 16 ml  Total amount Supplemented: 60 ml formula via Dr. Saul Fordyce bottle Total amount pumped post feed: did not pump   Plan:  1. Offer breast with feeding cues, allow infant to feed as long as he wants 2. Keep infant awake with feeding as needed 3. Massage breasts with feedings when infant slows down with feeding 4. Empty first breast before offering second breast 5. Offer infant supplement in a bottle of breast milk and/or formula if he is still cueing to feed after breast feeding 6. Offer infant feeding using a slow flow nipple such as your Dr. Saul Fordyce nipple 7. Feed infant using the paced bottle feeding method (video on [email or web address]) 8.  Infant needs about 73-98 ml (2.5-3.5 ounces) for 8 feedings a day or 585-780 ml (20-26 ounces) in 24 hours. He may take more or less depending on how often he feeds.  9. Would recommend you pump both breasts about 6 x a day after breast feeding for 15-20 minutes with Medela double pump to protect milk supply. Pump any time you offer infant a bottle 10. Speak with OB about trying Mother Love More Milk Special Blend,  take according to directions on the bottle 11. Keep up the good work 80. Thank you for allowing me to assist you today 13. Please call with any questions/concerns as needed [phone] 14. Follow up with Lactation as needed    Donn Pierini RN, IBCLC                                                     Debby Freiberg [place] 03/19/2018, 2:08 PM

## 2018-03-19 NOTE — Progress Notes (Signed)
Referral to [facility] PT for vaginal pain and 3rd degree laceration after NSVD

## 2018-03-25 ENCOUNTER — Ambulatory Visit (INDEPENDENT_AMBULATORY_CARE_PROVIDER_SITE_OTHER): Payer: 59 | Admitting: Advanced Practice Midwife

## 2018-03-25 ENCOUNTER — Encounter: Payer: Self-pay | Admitting: Advanced Practice Midwife

## 2018-03-25 VITALS — BP 121/79 | HR 100 | Resp 16 | Ht 64.0 in | Wt 154.0 lb

## 2018-03-25 DIAGNOSIS — O8612 Endometritis following delivery: Secondary | ICD-10-CM

## 2018-03-25 DIAGNOSIS — O9081 Anemia of the puerperium: Secondary | ICD-10-CM

## 2018-03-25 DIAGNOSIS — R52 Pain, unspecified: Secondary | ICD-10-CM

## 2018-03-25 DIAGNOSIS — N8189 Other female genital prolapse: Secondary | ICD-10-CM

## 2018-03-25 MED ORDER — AMOXICILLIN-POT CLAVULANATE 875-125 MG PO TABS: 1.0000 | ORAL_TABLET | Freq: Two times a day (BID) | ORAL | 0 refills | Status: AC

## 2018-03-25 MED FILL — AMOX-CLAV 875-125 MG TABLET: 875-125 | 7 days supply | Qty: 14 | Fill #0

## 2018-03-25 NOTE — Progress Notes (Signed)
Post Partum Exam  Lindsey Sellers is a 32 y.o. G18P1001 female who presents for a postpartum visit. She is 3 weeks postpartum following a spontaneous vaginal delivery. I have fully reviewed the prenatal and intrapartum course. The delivery was at 40w 2d gestational weeks with 3rd degree perineal laceration. Anesthesia: Nitrous oxide and IV pain meds. Postpartum course has been eventful with readmission for retained POC , blood transfusion and HTN. Baby's course has been unremarkable. Baby is feeding by both breast and bottle - Enfamil. Bleeding red. Bowel function is normal. Bladder function is normal. Patient is not currently sexually active. Contraception method is oral progesterone-only contraceptive which she has not started. Postpartum depression screening:neg  Pt reports daily lower abdominal soreness pain, worse when sitting with pressure against her abdomen and when breastfeeding.  She also reports perineal/rectal pressure when standing.  Her bleeding is still bright red, and is moderate, requiring a pad change 3-4 times per day. She denies any fever/chills or n/v.  She reports feeling fatigued.  She felt better after her hospital discharge on 03/18/18 after receiving blood but is starting to feel weak and tired again. She denies shortness of breath, chest pain or palpitations.  She has low milk supply and is supplementing with formula, which she feels is related to her anemia.  The following portions of the patient's history were reviewed and updated as appropriate: allergies, current medications, past family history, past medical history, past social history, past surgical history and problem list. Last pap smear done 4/18 and was normal  Review of Systems Pertinent items noted in HPI and remainder of comprehensive ROS otherwise negative.    Objective:  Blood pressure 121/79, pulse 100, resp. rate 16, height 5\' 4"  (1.626 m), weight 154 lb (69.9 kg), currently breastfeeding.  VS reviewed,  nursing note reviewed,  Constitutional: well developed, well nourished, no distress HEENT: normocephalic CV: normal rate Pulm/chest wall: normal effort Breast Exam: Deferred Abdomen: soft Neuro: alert and oriented x 3 Skin: warm, dry Psych: affect normal Pelvic exam:On visual inspection, perineum healing well, tissue well approximated with no edema or erythema.  Posterior vaginal wall /perineum intact to light pressure exam with gloved finger.  Suture visible at introitus, some pulling with pt positioned in stirrups.    Assessment/Plan:   1. Postpartum anemia, baby delivered during previous episode of care --Continue daily iron and PNV. - CBC  2. Pain related to vaginal delivery --Abdominal pain may be musculoskeletal related to recent admission and frequent fundal checks/massage for bleeding but is more than expected and associated with bright red bleeding.  3. Endometritis following delivery --Given increased abdominal pain and ongoing bleeding that is still bright red, and pt positive GBS status, will continue treatment for endometritis. Pt was given Unasyn IV during hospital admission on 03/17/18. - amoxicillin-clavulanate (AUGMENTIN) 875-125 MG tablet; Take 1 tablet by mouth 2 (two) times daily for 7 days.  Dispense: 14 tablet; Refill: 0  4. Third-degree perineal laceration, postpartum --Healing well. --Encouraged sitz baths to improve healing/dissolve visible stitches  1. Contraception: PO OCP 2. Follow up in: 3 weeks or as needed.   Fatima Blank, CNM 2:46 PM

## 2018-03-25 NOTE — Patient Instructions (Signed)
Care of a Perineal Tear °A perineal tear is a cut (laceration) in the tissue between the opening of the vagina and the anus (perineum). Some women naturally develop a perineal tear during a vaginal birth. This can happen as the baby emerges from the birth canal and the perineum is stretched. Perineal tears are graded based on how deep and long the laceration is. The grading for perineal tears is as follows: °· First degree. This involves a shallow tear at the edge of the vaginal opening that extends slightly into the perineal skin. °· Second degree. This involves tearing described in a first degree perineal tear and also a deeper tear of the vaginal opening and perineal tissues. It may also include tearing of a muscle just under the perineal skin. °· Third degree. This involves tearing described in a first and second degree perineal tear, with the tear extending into the muscle of the anus (anal sphincter). °· Fourth degree. This involves all levels of tear described for first, second, and third degree perineal tear, with the tear extending into the rectum. ° °First degree perineal tears may or may not be stitched closed, depending on their location and appearance. Second, third, and fourth degree perineal tears are stitched closed immediately after the baby’s birth. °What are the risks? °Depending on the type of perineal tear you have, you may be at risk for the following: °· Bleeding. °· Developing a collection of blood in the perineal tear area (hematoma). °· Pain. This may include pain with urination or bowel movements. °· Infection at the site of the tear. °· Fever. °· Trouble controlling your bowels (fecal incontinence). °· Painful sexual intercourse. ° °How to care for a perineal tear °· The first day, put ice on the area of the tear. °? Put ice in a plastic bag. °? Place a towel between your skin and the bag. °? Leave the ice on for 20 minutes, 2-3 times a day. °· Bathe using a warm sitz bath as directed by  your health care provider. This can speed up healing. Sitz baths can be performed in your bathtub or using a sitz bath kit that fits over your toilet. °? Place 3-4 in. (7.6-10 cm) of warm water in your bathtub or fill the sitz bath over-the-toilet container with warm water. Make sure the water is not too hot by placing a drop on your wrist. °? Sit in the warm water for 20-30 minutes. °? After bathing, pat your perineum dry with a clean towel. Do not scrub the perineum as this could cause pain, irritation, or open any stitches you may have. °? Keep the over-the-toilet sitz bath container clean by rinsing it thoroughly after each use. Ask for help in keeping the bathtub clean with diluted bleach and water (2 Tbsp [30 mL] of bleach to ½ gal [1.9 L] of water). °? Repeat the sitz bath as often as you would like to relieve perineal pain, itching, or discomfort. °· Apply a numbing spray to the perineal tear site as directed by your health care provider. This may help with discomfort. °· Wash your hands before and after applying medicine to the area. °· Put about 3 witch hazel-containing hemorrhoid treatment pads on top of your sanitary pad. The witch hazel in the hemorrhoid pads helps with discomfort and swelling. °· Get a squeeze bottle to squeeze warm water on your perineum when urinating, spraying the area from front to back. Pat the area to dry it. °· Sitting on an inflatable   ring or pillow may provide comfort.  Take medicines only as directed by your health care provider.  Do not have sexual intercourse or use tampons until your health care provider says it is okay. Typically, you must wait at least 6 weeks.  Keep all postpartum appointments as directed by your health care provider. Contact a health care provider if:  Your pain is not relieved with medicines.  You have painful urination.  You have a fever. Get help right away if:  You have redness, swelling, or increasing pain in the area of the  tear.  You have pus coming from the area of the tear.  You notice a bad smell coming from the area of the tear.  Your tear opens.  You notice swelling in the area of the tear that is larger than when you left the hospital.  You cannot urinate. This information is not intended to replace advice given to you by your health care provider. Make sure you discuss any questions you have with your health care provider. Document Released: 08/24/2013 Document Revised: 09/21/2015 Document Reviewed: 01/13/2013 Elsevier Interactive Patient Education  2017 [employer]   Endometritis Endometritis is irritation, soreness, or inflammation that affects the lining of the uterus (endometrium). Infection is usually the cause of endometritis. It is important to get treatment to prevent complications. Common complications may include more severe infections and not being able to have children(infertility). What are the causes? This condition may be caused by:  Bacterial infections.  STIs (sexually transmitted infections).  A miscarriage or childbirth, especially after a long labor or cesarean delivery.  Certain gynecological procedures. These may include dilation and curettage (D&C), hysteroscopy, or birth control (contraceptive) insertion.  Tuberculosis (TB).  What are the signs or symptoms? Symptoms of this condition include:  Fever.  Lower abdomen (abdominal) pain.  Pelvis (pelvic) pain.  Abnormal vaginal discharge or bleeding.  Abdominal bloating (distention) or swelling.  General discomfort or generally feeling ill.  Discomfort with bowel movements.  Constipation.  How is this diagnosed? This condition may be diagnosed based on:  A physical exam, including a pelvic exam.  Tests, such as: ? Blood tests. ? Removal of a sample of endometrial tissue for testing (endometrial biopsy). ? Examining a sample of vaginal discharge under a microscope (wet prep). ? Removal of a sample  of fluid from the cervix for testing (cervical culture). ? Surgical examination of the pelvis and abdomen.  How is this treated? This condition is treated with:  Antibiotic medicines.  For more severe cases, hospitalization may be needed to give fluids and antibiotics directly into a vein through an IV tube.  Follow these instructions at home:  Take over-the-counter and prescription medicines only as told by your health care provider.  Drink enough fluid to keep your urine clear or pale yellow.  Take your antibiotic medicine as told by your health care provider. Do not stop taking the antibiotic even if you start to feel better.  Do not douche or have sex (including vaginal, oral, and anal sex) until your health care provider approves.  If your endometritis was caused by an STI, do not have sex (including vaginal, oral, and anal sex) until your partner has also been treated for the STI.  Return to your normal activities as told by your health care provider. Ask your health care provider what activities are safe for you.  Keep all follow-up visits as told by your health care provider. This is important. Contact a health care  provider if:  You have pain that does not get better with medicine.  You have a fever.  You have pain with bowel movements. Get help right away if:  You have abdominal swelling.  You have abdominal pain that gets worse.  You have bad-smelling vaginal discharge, or an increased amount of vaginal discharge.  You have abnormal vaginal bleeding.  You have nausea and vomiting. Summary  Endometritis affects the lining of the uterus (endometrium) and is usually caused by an infection.  It is important to get treatment to prevent complications.  You have several treatment options for endometritis. Treatment may include antibiotics and IV fluids.  Take your antibiotic medicine as told by your health care provider. Do not stop taking the antibiotic even if  you start to feel better.  Do not douche or have sex (including vaginal, oral, and anal sex) until your health care provider approves. This information is not intended to replace advice given to you by your health care provider. Make sure you discuss any questions you have with your health care provider. Document Released: 04/03/2001 Document Revised: 04/24/2016 Document Reviewed: 04/24/2016 Elsevier Interactive Patient Education  2017 [employer].

## 2018-03-26 LAB — CBC
HCT: 32.9 % — ABNORMAL LOW (ref 35.0–45.0)
Hemoglobin: 11.1 g/dL — ABNORMAL LOW (ref 11.7–15.5)
MCH: 30.9 pg (ref 27.0–33.0)
MCHC: 33.7 g/dL (ref 32.0–36.0)
MCV: 91.6 fL (ref 80.0–100.0)
MPV: 10.6 fL (ref 7.5–12.5)
PLATELETS: 365 10*3/uL (ref 140–400)
RBC: 3.59 10*6/uL — ABNORMAL LOW (ref 3.80–5.10)
RDW: 14.9 % (ref 11.0–15.0)
WBC: 7.2 10*3/uL (ref 3.8–10.8)

## 2018-03-27 ENCOUNTER — Encounter: Payer: Self-pay | Admitting: Advanced Practice Midwife

## 2018-04-01 ENCOUNTER — Ambulatory Visit: Payer: 59 | Admitting: Advanced Practice Midwife

## 2018-04-02 ENCOUNTER — Telehealth: Payer: Self-pay | Admitting: *Deleted

## 2018-04-02 ENCOUNTER — Encounter: Payer: Self-pay | Admitting: Advanced Practice Midwife

## 2018-04-02 MED ORDER — FLUCONAZOLE 150 MG PO TABS: ORAL_TABLET | ORAL | 0 refills | Status: DC

## 2018-04-02 NOTE — Telephone Encounter (Signed)
Pt called because she has been on 2 antibiotics in the last couple of weeks and now feels like she is getting a yeast infection.  I gave her the choice between Monistat or Diflucan.  She has chosen Diflucan which is safe in breast feeding.  Rx sent to [facility] on [address]. Per Dr Hulan Fray

## 2018-04-08 ENCOUNTER — Encounter: Payer: Self-pay | Admitting: Certified Nurse Midwife

## 2018-04-08 ENCOUNTER — Ambulatory Visit (INDEPENDENT_AMBULATORY_CARE_PROVIDER_SITE_OTHER): Payer: 59 | Admitting: Certified Nurse Midwife

## 2018-04-08 VITALS — BP 126/87 | HR 75 | Ht 64.0 in | Wt 157.0 lb

## 2018-04-08 DIAGNOSIS — N8189 Other female genital prolapse: Secondary | ICD-10-CM

## 2018-04-08 DIAGNOSIS — O8612 Endometritis following delivery: Secondary | ICD-10-CM

## 2018-04-08 NOTE — Progress Notes (Signed)
   GYNECOLOGY OFFICE VISIT NOTE  History:  32 y.o. G1P1001 5 weeks post SVD here today for follow up of endometritis and 3rd degree laceration. She completed the Augmentin. Lochia is minimal. No fevers. No abd or pelvic pain. Feeling much better. She is breastfeeding. Continues to have some perineal soreness. Not had IC yet.   Past Medical History:  Diagnosis Date  . Abnormal pap 04/2006  . 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 COLPOSCOPY  2008   no biopsy done  . WISDOM TOOTH EXTRACTION      The following portions of the patient's history were reviewed and updated as appropriate: allergies, current medications, past family history, past medical history, past social history, past surgical history and problem list.    Review of Systems:  Genito-Urinary ROS: positive for - vulvar/vaginal symptoms negative for - pelvic pain or urinary frequency/urgency   Objective:  Physical Exam BP 126/87   Pulse 75   Ht 5\' 4"  (1.626 m)   Wt 71.2 kg   Breastfeeding Yes   BMI 26.95 kg/m  CONSTITUTIONAL: Well-developed, well-nourished female in no acute distress.  HENT:  Normocephalic, atraumatic EYES: Conjunctivae and EOM are normal NECK: Normal range of motion SKIN: Skin is warm and dry NEUROLOGIC: Alert and oriented to person, place, and time PSYCHIATRIC: Normal mood and affect CARDIOVASCULAR: Normal heart rate noted RESPIRATORY: Effort and rate normal ABDOMEN: Soft, no distention  PELVIC: perineal laceration approximated and healing, suture remnants present MUSCULOSKELETAL: Normal range of motion  Labs and Imaging  Assessment & Plan:   1. Postpartum endometritis   2. Perineal laceration with delivery, third degree, postpartum condition     Follow up in 2-3 weeks for laceration check Sitz baths Return precautions Pelvic rest   Julianne Handler, [place] 04/08/2018 4:36 PM

## 2018-04-14 ENCOUNTER — Other Ambulatory Visit: Payer: Self-pay

## 2018-04-14 ENCOUNTER — Ambulatory Visit: Payer: 59 | Attending: Obstetrics & Gynecology | Admitting: Physical Therapy

## 2018-04-14 DIAGNOSIS — M6281 Muscle weakness (generalized): Secondary | ICD-10-CM | POA: Insufficient documentation

## 2018-04-14 DIAGNOSIS — R278 Other lack of coordination: Secondary | ICD-10-CM | POA: Diagnosis not present

## 2018-04-14 NOTE — Therapy (Addendum)
[facility] [address], [place], [place], [address] Phone: [phone]   Fax:  [phone]  Physical Therapy Evaluation  Patient Details  Name: Lindsey Sellers MRN: [id number] Date of Birth: Nov 10, 1985 Referring Provider (PT): Guss Bunde   Encounter Date: 04/14/2018  PT End of Session - 04/14/18 1327    Visit Number  1    Date for PT Re-Evaluation  06/09/18    PT Start Time  9735    PT Stop Time  1055    PT Time Calculation (min)  40 min    Activity Tolerance  Patient tolerated treatment well    Behavior During Therapy  [facility] for tasks assessed/performed       Past Medical History:  Diagnosis Date  . Abnormal pap 04/2006  . 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 COLPOSCOPY  2008   no biopsy done  . WISDOM TOOTH EXTRACTION      There were no vitals filed for this visit.   Subjective Assessment - 04/14/18 1019    Subjective  Pt had 3rd degree tear and infection in uterus and was then hemerraging.  She has still a lot of pain.  Pt had stitches that are still in place at the time.      Pertinent History  1 vaginal delivery    Patient Stated Goals  control leakage    Currently in Pain?  No/denies         [facility] PT Assessment - 04/14/18 0001      Assessment   Medical Diagnosis  O75.89,R52 (ICD-10-CM) - Pain related to vaginal delivery    Referring Provider (PT)  Gala Romney, KELLY H    Onset Date/Surgical Date  03/05/18    Prior Therapy  No      Precautions   Precautions  None      Restrictions   Weight Bearing Restrictions  No      Balance Screen   Has the patient fallen in the past 6 months  No      [place] residence    Living Arrangements  Spouse/significant other;Children      Prior Function   Level of Independence  Independent      Cognition   Overall Cognitive Status  Within Functional Limits for tasks assessed      Posture/Postural Control   Posture Comments  anterior rotated pelvis      ROM / Strength   AROM / PROM / Strength  AROM;PROM;Strength      AROM   Overall AROM Comments  WFL      PROM   Overall PROM Comments  WFL      Strength   Overall Strength Comments  hip adduction 4/5 MMT bilateral      Flexibility   Soft Tissue Assessment /Muscle Length  yes    Hamstrings  20% lmited hamstring      Palpation   Palpation comment  diastasis rectus abdominis 1 finger      Ambulation/Gait   Gait Pattern  Within Functional Limits                Objective measurements completed on examination: See above findings.    Pelvic Floor Special Questions - 04/14/18 0001    Prior Pelvic/Prostate Exam  Yes    Are you Pregnant or attempting pregnancy?  No  Prior Pregnancies  Yes    Number of Pregnancies  1    Number of Vaginal Deliveries  1    Any difficulty with labor and deliveries  Yes    Episiotomy Performed  Yes   3rd degree   Currently Sexually Active  No   still recovering   Urinary Leakage  Yes    How often  1x/ day   a little   Pad use  no    Activities that cause leaking  Coughing;Sneezing    Urinary urgency  No    Urinary frequency  1x/2 hours    Fecal incontinence  No   was difficult to have BM, but getting better   Falling out feeling (prolapse)  No    External Perineal Exam  palpation done external to clothing       [facility] PT Treatment/Exercise - 04/14/18 0001      Self-Care   Self-Care  Other Self-Care Comments    Other Self-Care Comments   toileting techniques and initial HEP             PT Education - 04/14/18 1326    Education Details  toileting techniques;  Access Code: [email or web address]     Person(s) Educated  Patient    Methods  Explanation;Demonstration;Handout;Verbal cues    Comprehension  Verbalized understanding;Returned demonstration       PT Short Term Goals -  04/14/18 1342      PT SHORT TERM GOAL #1   Title  ind with initial HEP    Time  4    Period  Weeks    Status  New    Target Date  05/12/18        PT Long Term Goals - 04/14/18 1338      PT LONG TERM GOAL #1   Title  pt will be ind with advanced HEP    Time  8    Period  Weeks    Status  New    Target Date  06/09/18      PT LONG TERM GOAL #2   Title  Pt will report no urinary leakage when coughing or sneezing due to ability to perform knack    Time  8    Period  Weeks    Status  New    Target Date  06/09/18      PT LONG TERM GOAL #3   Title  Pt will demonstrate hip adduction strength of 5/5 for improved ability to perform correct lifting technique    Time  8    Period  Weeks    Status  New    Target Date  06/09/18      PT LONG TERM GOAL #4   Title  pt will be able to engage TrA during lifting technique for improve core stability to avoid risk of injury    Time  8    Period  Weeks    Status  New    Target Date  06/09/18             Plan - 04/14/18 1329    Clinical Impression Statement  Pt presents to skilled PT due to pelvic pain and urinary leakage.  Pt has been gradually imporving.  She demonstrates diastasis of rectus abdominis of one finger.  Pt has LE weakness as mentioned above.  Deferred internal soft tissue assessment due to still having stitches in place  Pt will benefit from skilled PT to address impairments for maximum functional  activities.    History and Personal Factors relevant to plan of care:  1 vaginal delivery    Clinical Presentation  Stable    Clinical Presentation due to:  pt is stable    Clinical Decision Making  Low    Rehab Potential  Excellent    PT Frequency  1x / week    PT Duration  8 weeks    PT Treatment/Interventions  ADLs/Self Care Home Management;Biofeedback;Cryotherapy;Electrical Stimulation;Moist Heat;Therapeutic activities;Therapeutic exercise;Neuromuscular re-education;Patient/family education;Manual techniques;Dry  needling;Passive range of motion;Taping    PT Next Visit Plan  f/u on initial HEP, progress core and pelvic floor strength/endurance, functional movements    PT Home Exercise Plan   Access Code: [email or web address]     Recommended Other Services  eval 04/14/18    Consulted and Agree with Plan of Care  Patient       Patient will benefit from skilled therapeutic intervention in order to improve the following deficits and impairments:  Abnormal gait, Pain, Increased fascial restricitons, Decreased skin integrity, Decreased strength, Increased muscle spasms, Decreased endurance, Decreased activity tolerance  Visit Diagnosis: Muscle weakness (generalized)  Other lack of coordination     Problem List Patient Active Problem List   Diagnosis Date Noted  . Postpartum endometritis 03/17/2018  . Postpartum hypertension 03/17/2018    Zannie Cove, PT 04/14/2018, 1:59 PM  [facility] [address] [place], [place], [address] Phone: [phone]   Fax:  [phone]  Name: Lindsey Sellers MRN: [id number] Date of Birth: 1985-10-21   PHYSICAL THERAPY DISCHARGE SUMMARY  Visits from Start of Care: 1  Plan: Patient agrees to discharge.  Patient goals were partially met. Patient is being discharged due to not returning since the last visit.  ?????     Pt did not return after Eval.   Lyndee Hensen, PT, DPT 11:59 AM  10/07/18

## 2018-04-14 NOTE — Addendum Note (Signed)
Addended by: Lovett Calender D on: 04/14/2018 02:01 PM   Modules accepted: Orders

## 2018-04-14 NOTE — Patient Instructions (Signed)
Toileting Techniques for Bowel Movements (Defecation) Using your belly (abdomen) and pelvic floor muscles to have a bowel movement is usually instinctive.  Sometimes people can have problems with these muscles and have to relearn proper defecation (emptying) techniques.  If you have weakness in your muscles, organs that are falling out, decreased sensation in your pelvis, or ignore your urge to go, you may find yourself straining to have a bowel movement.  You are straining if you are: . holding your breath or taking in a huge gulp of air and holding it  . keeping your lips and jaw tensed and closed tightly . turning red in the face because of excessive pushing or forcing . developing or worsening your  hemorrhoids . getting faint while pushing . not emptying completely and have to defecate many times a day  If you are straining, you are actually making it harder for yourself to have a bowel movement.  Many people find they are pulling up with the pelvic floor muscles and closing off instead of opening the anus. Due to lack pelvic floor relaxation and coordination the abdominal muscles, one has to work harder to push the feces out.  Many people have never been taught how to defecate efficiently and effectively.  Notice what happens to your body when you are having a bowel movement.  While you are sitting on the toilet pay attention to the following areas: . Jaw and mouth position . Angle of your hips   . Whether your feet touch the ground or not . Arm placement  . Spine position . Waist . Belly tension . Anus (opening of the anal canal)  An Evacuation/Defecation Plan   Here are the 4 basic points:  1. Lean forward enough for your elbows to rest on your knees 2. Support your feet on the floor or use a low stool if your feet don't touch the floor  3. Push out your belly as if you have swallowed a beach ball-you should feel a widening of your waist 4. Open and relax your pelvic floor muscles,  rather than tightening around the anus      The following conditions my require modifications to your toileting posture:  . If you have had surgery in the past that limits your back, hip, pelvic, knee or ankle flexibility . Constipation   Your healthcare practitioner may make the following additional suggestions and adjustments:  1) Sit on the toilet  a) Make sure your feet are supported. b) Notice your hip angle and spine position-most people find it effective to lean forward or raise their knees, which can help the muscles around the anus to relax  c) When you lean forward, place your forearms on your thighs for support  2) Relax suggestions a) Breath deeply in through your nose and out slowly through your mouth as if you are smelling the flowers and blowing out the candles. b) To become aware of how to relax your muscles, contracting and releasing muscles can be helpful.  Pull your pelvic floor muscles in tightly by using the image of holding back gas, or closing around the anus (visualize making a circle smaller) and lifting the anus up and in.  Then release the muscles and your anus should drop down and feel open. Repeat 5 times ending with the feeling of relaxation. c) Keep your pelvic floor muscles relaxed; let your belly bulge out. d) The digestive tract starts at the mouth and ends at the anal opening, so be   sure to relax both ends of the tube.  Place your tongue on the roof of your mouth with your teeth separated.  This helps relax your mouth and will help to relax the anus at the same time.  3) Empty (defecation) a) Keep your pelvic floor and sphincter relaxed, then bulge your anal muscles.  Make the anal opening wide.  b) Stick your belly out as if you have swallowed a beach ball. c) Make your belly wall hard using your belly muscles while continuing to breathe. Doing this makes it easier to open your anus. d) Breath out and give a grunt (or try using other sounds such as  ahhhh, shhhhh, ohhhh or grrrrrrr).  4) Finish a) As you finish your bowel movement, pull the pelvic floor muscles up and in.  This will leave your anus in the proper place rather than remaining pushed out and down. If you leave your anus pushed out and down, it will start to feel as though that is normal and give you incorrect signals about needing to have a bowel movement.   [facility] [address] [place], [place] [address]  

## 2018-04-30 ENCOUNTER — Encounter: Payer: Self-pay | Admitting: Certified Nurse Midwife

## 2018-04-30 ENCOUNTER — Ambulatory Visit (INDEPENDENT_AMBULATORY_CARE_PROVIDER_SITE_OTHER): Payer: No Typology Code available for payment source | Admitting: Certified Nurse Midwife

## 2018-04-30 NOTE — Progress Notes (Signed)
   GYNECOLOGY OFFICE VISIT NOTE  History:  33 y.o. G1P1001 here today for follow up of 3rd degree laceration after SVD. She denies any abnormal vaginal discharge, bleeding, pelvic pain or other concerns. Vaginal area feels better, no pain. She reports continuing to have urinary incontinence and had consult with pelvic PT. No IC yet.  Past Medical History:  Diagnosis Date  . Abnormal pap 04/2006  . 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 COLPOSCOPY  2008   no biopsy done  . WISDOM TOOTH EXTRACTION      The following portions of the patient's history were reviewed and updated as appropriate: allergies, current medications, past family history, past medical history, past social history, past surgical history and problem list.    Review of Systems:  Genito-Urinary ROS: positive for - urinary incontinence, mostly stress negative for - genital discharge or pelvic pain   Objective:  Physical Exam BP 116/82   Pulse 73   Ht 5\' 5"  (1.651 m)   Wt 70.3 kg   Breastfeeding Yes   BMI 25.79 kg/m  CONSTITUTIONAL: Well-developed, well-nourished female in no acute distress.  HENT:  Normocephalic, atraumatic EYES: Conjunctivae and EOM are normal NECK: Normal range of motion SKIN: Skin is warm and dry NEUROLOGIC: Alert and oriented to person, place, and time PSYCHIATRIC: Normal mood and affect CARDIOVASCULAR: Normal heart rate noted RESPIRATORY: Effort and rate normal ABDOMEN: Soft, no distention  PELVIC: laceration well healed, suture dissolved, vaginal floor intact, no prolapse MUSCULOSKELETAL: Normal range of motion  Labs and Imaging No results found.  Assessment & Plan:   1. Third degree laceration of perineum during delivery, postpartum    Start POP, use barrier x1 week Ok to resume IC Continue pelvic floor pT Follow up in April for annual and pap   Total face-to-face time with  patient: 15 minutes   Julianne Handler, [place] 04/30/2018 12:00 PM

## 2018-05-13 ENCOUNTER — Ambulatory Visit (INDEPENDENT_AMBULATORY_CARE_PROVIDER_SITE_OTHER): Payer: No Typology Code available for payment source | Admitting: Physician Assistant

## 2018-05-13 ENCOUNTER — Encounter: Payer: Self-pay | Admitting: Physician Assistant

## 2018-05-13 VITALS — BP 146/90 | HR 67 | Temp 97.8°F | Ht 65.0 in | Wt 157.0 lb

## 2018-05-13 DIAGNOSIS — L608 Other nail disorders: Secondary | ICD-10-CM

## 2018-05-13 NOTE — Progress Notes (Signed)
Lindsey Sellers is a 33 y.o. female here for a new problem.  I acted as a Education administrator for [employer], PA-C Lindsey Pickler, LPN  History of Present Illness:   Chief Complaint  Patient presents with  . Nail Problem    HPI  Nail Fungus Pt thinks Right Great toe has developed fungus since last pedicure. Started few months ago. R great toe is tender and nail is thickened. Denies: fever, discharge, severe pain, numbness/tingling.  She is currently breastfeeding.  Past Medical History:  Diagnosis Date  . Abnormal pap 04/2006  . 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 Marital status: Married    Spouse name: Not on file  . Number of children: Not on file  . Years of education: Not on file  . Highest education level: Not on file  Occupational History  . Not on file  Social Needs  . Financial resource strain: Not on file  . Food insecurity:    Worry: Not on file    Inability: Not on file  . Transportation needs:    Medical: Not on file    Non-medical: Not on file  Tobacco Use  . Smoking status: Never Smoker  . Smokeless tobacco: Never Used  Substance and Sexual Activity  . Alcohol use: Not Currently    Alcohol/week: 5.0 standard drinks    Types: 2 Glasses of wine, 3 Standard drinks or equivalent per week  . Drug use: No  . Sexual activity: Not Currently    Partners: Male  Lifestyle  . Physical activity:    Days per week: Not on file    Minutes per session: Not on file  . Stress: Not on file  Relationships  . Social connections:    Talks on phone: Not on file    Gets together: Not on file    Attends religious service: Not on file    Active member of club or organization: Not on file    Attends meetings of clubs or organizations: Not on file    Relationship status: Not on file  . Intimate partner violence:    Fear of current or ex partner: Not on file    Emotionally abused:  Not on file    Physically abused: Not on file    Forced sexual activity: Not on file  Other Topics Concern  . 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 COLPOSCOPY  2008   no biopsy done  . WISDOM TOOTH EXTRACTION      Family History  Problem Relation Age of Onset  . Cancer Paternal Grandmother        lymph node  . Gout Father   . Colon cancer Neg Hx   . Breast cancer Neg Hx     No Known Allergies  Current Medications:   Current Outpatient Medications:  .  Ferrous Fumarate (HEMOCYTE - 106 MG FE) 324 (106 Fe) MG TABS tablet, Take 1 tablet (106 mg of iron total) by mouth 2 (two) times daily., Disp: 30 tablet, Rfl: 1 .  Prenatal Vit-Fe Fumarate-FA (PRENATAL VITAMIN PO),  Take by mouth., Disp: , Rfl:  .  norethindrone (ORTHO MICRONOR) 0.35 MG tablet, Take 1 tablet (0.35 mg total) by mouth daily. (Patient not taking: Reported on 04/08/2018), Disp: 1 Package, Rfl: 11   Review of Systems:   ROS  Negative unless otherwise specified per HPI.  Vitals:   Vitals:   05/13/18 1030  BP: (!) 146/90  Pulse: 67  Temp: 97.8 F (36.6 C)  TempSrc: Oral  SpO2: 97%  Weight: 157 lb (71.2 kg)  Height: 5\' 5"  (1.651 m)     Body mass index is 26.13 kg/m.  Physical Exam:   Physical Exam Constitutional:      Appearance: She is well-developed.  HENT:     Head: Normocephalic and atraumatic.  Eyes:     Conjunctiva/sclera: Conjunctivae normal.  Neck:     Musculoskeletal: Normal range of motion and neck supple.  Pulmonary:     Effort: Pulmonary effort is normal.  Musculoskeletal: Normal range of motion.     Comments: R great toenail thickened. Tenderness with deep palpation of nail. No erythema at nail folds or active discharge present.   Skin:    General: Skin is warm and dry.  Neurological:     Mental Status: She is alert and oriented to person, place, and time.  Psychiatric:        Behavior: Behavior normal.        Thought Content: Thought content normal.        Judgment: Judgment normal.     Assessment and Plan:   Lindsey Sellers was seen today for nail problem.  Diagnoses and all orders for this visit:  Toenail deformity   Possible fungus. Because of breastfeeding status, discussed conservative measures such as Vicks Vapor Rub.  . Reviewed expectations re: course of current medical issues. . Discussed self-management of symptoms. . Outlined signs and symptoms indicating need for more acute intervention. . Patient verbalized understanding and all questions were answered. . See orders for this visit as documented in the electronic medical record. . Patient received an After-Visit Summary.  CMA or LPN served as scribe during this visit. History, Physical, and Plan performed by medical provider. The above documentation has been reviewed and is accurate and complete.   Inda Coke, PA-C

## 2018-11-03 ENCOUNTER — Other Ambulatory Visit: Payer: Self-pay

## 2018-11-03 ENCOUNTER — Ambulatory Visit (INDEPENDENT_AMBULATORY_CARE_PROVIDER_SITE_OTHER): Payer: No Typology Code available for payment source | Admitting: Physician Assistant

## 2018-11-03 ENCOUNTER — Encounter: Payer: Self-pay | Admitting: Physician Assistant

## 2018-11-03 VITALS — BP 120/82 | HR 89 | Temp 98.3°F | Ht 65.0 in | Wt 153.0 lb

## 2018-11-03 DIAGNOSIS — Z13220 Encounter for screening for lipoid disorders: Secondary | ICD-10-CM | POA: Diagnosis not present

## 2018-11-03 DIAGNOSIS — Z136 Encounter for screening for cardiovascular disorders: Secondary | ICD-10-CM | POA: Diagnosis not present

## 2018-11-03 DIAGNOSIS — Z0000 Encounter for general adult medical examination without abnormal findings: Secondary | ICD-10-CM

## 2018-11-03 LAB — LIPID PANEL
Cholesterol: 189 mg/dL (ref 0–200)
HDL: 70.2 mg/dL (ref >39.00)
LDL Cholesterol: 100 mg/dL — ABNORMAL HIGH (ref 0–99)
NonHDL: 118.56
Total CHOL/HDL Ratio: 3
Triglycerides: 95 mg/dL (ref 0.0–149.0)
VLDL: 19 mg/dL (ref 0.0–40.0)

## 2018-11-03 LAB — COMPREHENSIVE METABOLIC PANEL
ALT: 17 U/L (ref 0–35)
AST: 13 U/L (ref 0–37)
Albumin: 4.9 g/dL (ref 3.5–5.2)
Alkaline Phosphatase: 58 U/L (ref 39–117)
BUN: 9 mg/dL (ref 6–23)
CO2: 28 mEq/L (ref 19–32)
Calcium: 9.4 mg/dL (ref 8.4–10.5)
Chloride: 101 mEq/L (ref 96–112)
Creatinine, Ser: 0.69 mg/dL (ref 0.40–1.20)
GFR: 96.66 mL/min (ref >60.00)
Glucose, Bld: 76 mg/dL (ref 70–99)
Potassium: 4.1 mEq/L (ref 3.5–5.1)
Sodium: 139 mEq/L (ref 135–145)
Total Bilirubin: 0.4 mg/dL (ref 0.2–1.2)
Total Protein: 7.5 g/dL (ref 6.0–8.3)

## 2018-11-03 LAB — CBC WITH DIFFERENTIAL/PLATELET
Basophils Absolute: 0 10*3/uL (ref 0.0–0.1)
Basophils Relative: 0.7 % (ref 0.0–3.0)
Eosinophils Absolute: 0.1 10*3/uL (ref 0.0–0.7)
Eosinophils Relative: 1.7 % (ref 0.0–5.0)
HCT: 44.7 % (ref 36.0–46.0)
Hemoglobin: 15.1 g/dL — ABNORMAL HIGH (ref 12.0–15.0)
Lymphocytes Relative: 35.2 % (ref 12.0–46.0)
Lymphs Abs: 2.3 10*3/uL (ref 0.7–4.0)
MCHC: 33.8 g/dL (ref 30.0–36.0)
MCV: 95.2 fl (ref 78.0–100.0)
Monocytes Absolute: 0.4 10*3/uL (ref 0.1–1.0)
Monocytes Relative: 5.9 % (ref 3.0–12.0)
Neutro Abs: 3.6 10*3/uL (ref 1.4–7.7)
Neutrophils Relative %: 56.5 % (ref 43.0–77.0)
Platelets: 216 10*3/uL (ref 150.0–400.0)
RBC: 4.69 Mil/uL (ref 3.87–5.11)
RDW: 12.3 % (ref 11.5–15.5)
WBC: 6.4 10*3/uL (ref 4.0–10.5)

## 2018-11-03 NOTE — Patient Instructions (Addendum)
It was great to see you!  Please go to the lab for blood work.   Our office will call you with your results unless you have chosen to receive results via MyChart.  If your blood work is normal we will follow-up each year for physicals and as scheduled for chronic medical problems.  If anything is abnormal we will treat accordingly and get you in for a follow-up.  Take care,  Samantha    Health Maintenance, Female Adopting a healthy lifestyle and getting preventive care are important in promoting health and wellness. Ask your health care provider about:  The right schedule for you to have regular tests and exams.  Things you can do on your own to prevent diseases and keep yourself healthy. What should I know about diet, weight, and exercise? Eat a healthy diet   Eat a diet that includes plenty of vegetables, fruits, low-fat dairy products, and lean protein.  Do not eat a lot of foods that are high in solid fats, added sugars, or sodium. Maintain a healthy weight Body mass index (BMI) is used to identify weight problems. It estimates body fat based on height and weight. Your health care provider can help determine your BMI and help you achieve or maintain a healthy weight. Get regular exercise Get regular exercise. This is one of the most important things you can do for your health. Most adults should:  Exercise for at least 150 minutes each week. The exercise should increase your heart rate and make you sweat (moderate-intensity exercise).  Do strengthening exercises at least twice a week. This is in addition to the moderate-intensity exercise.  Spend less time sitting. Even light physical activity can be beneficial. Watch cholesterol and blood lipids Have your blood tested for lipids and cholesterol at 35 years of age, then have this test every 5 years. Have your cholesterol levels checked more often if:  Your lipid or cholesterol levels are high.  You are older than 35  years of age.  You are at high risk for heart disease. What should I know about cancer screening? Depending on your health history and family history, you may need to have cancer screening at various ages. This may include screening for:  Breast cancer.  Cervical cancer.  Colorectal cancer.  Skin cancer.  Lung cancer. What should I know about heart disease, diabetes, and high blood pressure? Blood pressure and heart disease  High blood pressure causes heart disease and increases the risk of stroke. This is more likely to develop in people who have high blood pressure readings, are of African descent, or are overweight.  Have your blood pressure checked: ? Every 3-5 years if you are 18-39 years of age. ? Every year if you are 40 years old or older. Diabetes Have regular diabetes screenings. This checks your fasting blood sugar level. Have the screening done:  Once every three years after age 40 if you are at a normal weight and have a low risk for diabetes.  More often and at a younger age if you are overweight or have a high risk for diabetes. What should I know about preventing infection? Hepatitis B If you have a higher risk for hepatitis B, you should be screened for this virus. Talk with your health care provider to find out if you are at risk for hepatitis B infection. Hepatitis C Testing is recommended for:  Everyone born from 1945 through 1965.  Anyone with known risk factors for hepatitis C. Sexually   transmitted infections (STIs)  Get screened for STIs, including gonorrhea and chlamydia, if: ? You are sexually active and are younger than 35 years of age. ? You are older than 35 years of age and your health care provider tells you that you are at risk for this type of infection. ? Your sexual activity has changed since you were last screened, and you are at increased risk for chlamydia or gonorrhea. Ask your health care provider if you are at risk.  Ask your health  care provider about whether you are at high risk for HIV. Your health care provider may recommend a prescription medicine to help prevent HIV infection. If you choose to take medicine to prevent HIV, you should first get tested for HIV. You should then be tested every 3 months for as long as you are taking the medicine. Pregnancy  If you are about to stop having your period (premenopausal) and you may become pregnant, seek counseling before you get pregnant.  Take 400 to 800 micrograms (mcg) of folic acid every day if you become pregnant.  Ask for birth control (contraception) if you want to prevent pregnancy. Osteoporosis and menopause Osteoporosis is a disease in which the bones lose minerals and strength with aging. This can result in bone fractures. If you are 65 years old or older, or if you are at risk for osteoporosis and fractures, ask your health care provider if you should:  Be screened for bone loss.  Take a calcium or vitamin D supplement to lower your risk of fractures.  Be given hormone replacement therapy (HRT) to treat symptoms of menopause. Follow these instructions at home: Lifestyle  Do not use any products that contain nicotine or tobacco, such as cigarettes, e-cigarettes, and chewing tobacco. If you need help quitting, ask your health care provider.  Do not use street drugs.  Do not share needles.  Ask your health care provider for help if you need support or information about quitting drugs. Alcohol use  Do not drink alcohol if: ? Your health care provider tells you not to drink. ? You are pregnant, may be pregnant, or are planning to become pregnant.  If you drink alcohol: ? Limit how much you use to 0-1 drink a day. ? Limit intake if you are breastfeeding.  Be aware of how much alcohol is in your drink. In the [place] one drink equals one 12 oz bottle of beer (355 mL), one 5 oz glass of wine (148 mL), or one 1 oz glass of hard liquor (44 mL). General  instructions  Schedule regular health, dental, and eye exams.  Stay current with your vaccines.  Tell your health care provider if: ? You often feel depressed. ? You have ever been abused or do not feel safe at home. Summary  Adopting a healthy lifestyle and getting preventive care are important in promoting health and wellness.  Follow your health care provider's instructions about healthy diet, exercising, and getting tested or screened for diseases.  Follow your health care provider's instructions on monitoring your cholesterol and blood pressure. This information is not intended to replace advice given to you by your health care provider. Make sure you discuss any questions you have with your health care provider. Document Released: 10/23/2010 Document Revised: 04/02/2018 Document Reviewed: 04/02/2018 Elsevier Patient Education  2020 Elsevier Inc.  

## 2018-11-03 NOTE — Progress Notes (Signed)
Subjective:    Lindsey Sellers is a 33 y.o. female and is here for a comprehensive physical exam.  HPI  There are no preventive care reminders to display for this patient.  Acute Concerns: None  Chronic Issues: None  Health Maintenance: Immunizations -- UTD Mammogram -- n/a PAP -- normal on 08/10/16 Diet -- overall healthy, eats [employer] meals occasionally, drinks a lot of water (currently breastfeeding) Caffeine intake -- a few cups a day Sleep habits -- sleeping through the night when baby allows Exercise -- daily walks with baby Current Weight -- Weight: 153 lb (69.4 kg)  Weight History: Wt Readings from Last 10 Encounters:  11/03/18 153 lb (69.4 kg)  05/13/18 157 lb (71.2 kg)  04/30/18 155 lb (70.3 kg)  04/08/18 157 lb (71.2 kg)  03/25/18 154 lb (69.9 kg)  03/17/18 168 lb (76.2 kg)  03/08/18 168 lb (76.2 kg)  03/04/18 180 lb 1.6 oz (81.7 kg)  03/04/18 178 lb (80.7 kg)  02/28/18 178 lb (80.7 kg)   Mood -- good No LMP recorded. Birth control -- condoms  Depression screen [facility] 2/9 11/03/2018  Decreased Interest 0  Down, Depressed, Hopeless 0  PHQ - 2 Score 0     Other providers/specialists: Patient Care Team: Lindsey Sellers, [place] as PCP - General (Physician Assistant) Lindsey Sellers, CNM as Referring Physician (Certified Nurse Midwife)   PMHx, SurgHx, SocialHx, Medications, and Allergies were reviewed in the Visit Navigator and updated as appropriate.   Past Medical History:  Diagnosis Date  . Abnormal pap 04/2006  . 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 child birth  03/05/2018  . COLPOSCOPY  2008   no biopsy done  . WISDOM TOOTH EXTRACTION       Family History  Problem Relation Age of Onset  . Cancer Paternal Grandmother        lymph node  . Gout Father   . Colon cancer Neg Hx   . Breast cancer Neg Hx     Social History    Tobacco Use  . Smoking status: Never Smoker  . Smokeless tobacco: Never Used  Substance Use Topics  . Alcohol use: Not Currently    Alcohol/week: 5.0 standard drinks    Types: 2 Glasses of wine, 3 Standard drinks or equivalent per week  . Drug use: No    Review of Systems:   Review of Systems  Constitutional: Negative for chills, fever, malaise/fatigue and weight loss.  HENT: Negative for hearing loss, sinus pain and sore throat.   Respiratory: Negative for cough and hemoptysis.   Cardiovascular: Negative for chest pain, palpitations, leg swelling and PND.  Gastrointestinal: Negative for abdominal pain, constipation, diarrhea, heartburn, nausea and vomiting.  Genitourinary: Negative for dysuria, frequency and urgency.  Musculoskeletal: Negative for back pain, myalgias and neck pain.  Skin: Negative for itching and rash.  Neurological: Negative for dizziness, tingling, seizures and headaches.  Endo/Heme/Allergies: Negative for polydipsia.  Psychiatric/Behavioral: Negative for depression. The patient is not nervous/anxious.     Objective:   BP 120/82 (BP Location: Left Arm, Patient Position: Sitting, Cuff Size: Normal)   Pulse 89   Temp 98.3 F (36.8 C) (Oral)   Ht 5\' 5"  (1.651 m)   Wt 153 lb (69.4 kg)   SpO2 98%   BMI 25.46 kg/m   General Appearance:    Alert, cooperative, no distress, appears  stated age  Head:    Normocephalic, without obvious abnormality, atraumatic  Eyes:    PERRL, conjunctiva/corneas clear, EOM's intact, fundi    benign, both eyes  Ears:    Normal TM's and external ear canals, both ears  Nose:   Nares normal, septum midline, mucosa normal, no drainage    or sinus tenderness  Throat:   Lips, mucosa, and tongue normal; teeth and gums normal  Neck:   Supple, symmetrical, trachea midline, no adenopathy;    thyroid:  no enlargement/tenderness/nodules; no carotid   bruit or JVD  Back:     Symmetric, no curvature, ROM normal, no CVA tenderness  Lungs:      Clear to auscultation bilaterally, respirations unlabored  Chest Wall:    No tenderness or deformity   Heart:    Regular rate and rhythm, S1 and S2 normal, no murmur, rub   or gallop  Breast Exam:    Deferred  Abdomen:     Soft, non-tender, bowel sounds active all four quadrants,    no masses, no organomegaly  Genitalia:    Deferred  Rectal:    Deferred  Extremities:   Extremities normal, atraumatic, no cyanosis or edema  Pulses:   2+ and symmetric all extremities  Skin:   Skin color, texture, turgor normal, no rashes or lesions  Lymph nodes:   Cervical, supraclavicular, and axillary nodes normal  Neurologic:   CNII-XII intact, normal strength, sensation and reflexes    throughout     Assessment/Plan:   Lindsey Sellers was seen today for annual exam.  Diagnoses and all orders for this visit:  Routine physical examination Today patient counseled on age appropriate routine health concerns for screening and prevention, each reviewed and up to date or declined. Immunizations reviewed and up to date or declined. Labs ordered and reviewed. Risk factors for depression reviewed and negative. Hearing function and visual acuity are intact. ADLs screened and addressed as needed. Functional ability and level of safety reviewed and appropriate. Education, counseling and referrals performed based on assessed risks today. Patient provided with a copy of personalized plan for preventive services. -     CBC with Differential/Platelet -     Comprehensive metabolic panel  Encounter for lipid screening for cardiovascular disease -     Lipid panel    Well Adult Exam: Labs ordered: Yes. Patient counseling was done. See below for items discussed. Discussed the patient's BMI. The BMI is in the acceptable range Follow up as needed for acute illness.  Patient Counseling:   [x]     Nutrition: Stressed importance of moderation in sodium/caffeine intake, saturated fat and cholesterol, caloric balance, sufficient  intake of fresh fruits, vegetables, fiber, calcium, iron, and 1 mg of folate supplement per day (for females capable of pregnancy).   [x]      Stressed the importance of regular exercise.    [x]     Substance Abuse: Discussed cessation/primary prevention of tobacco, alcohol, or other drug use; driving or other dangerous activities under the influence; availability of treatment for abuse.    [x]      Injury prevention: Discussed safety belts, safety helmets, smoke detector, smoking near bedding or upholstery.    [x]      Sexuality: Discussed sexually transmitted diseases, partner selection, use of condoms, avoidance of unintended pregnancy  and contraceptive alternatives.    [x]     Dental health: Discussed importance of regular tooth brushing, flossing, and dental visits.   [x]      Health maintenance and immunizations reviewed.  Please refer to Health maintenance section.    Lindsey Coke, PA-C [place]

## 2019-03-13 ENCOUNTER — Ambulatory Visit (INDEPENDENT_AMBULATORY_CARE_PROVIDER_SITE_OTHER): Payer: No Typology Code available for payment source | Admitting: Certified Nurse Midwife

## 2019-03-13 ENCOUNTER — Encounter: Payer: Self-pay | Admitting: Certified Nurse Midwife

## 2019-03-13 ENCOUNTER — Other Ambulatory Visit: Payer: Self-pay

## 2019-03-13 VITALS — BP 122/89 | HR 85 | Ht 66.0 in | Wt 154.0 lb

## 2019-03-13 DIAGNOSIS — Z01419 Encounter for gynecological examination (general) (routine) without abnormal findings: Secondary | ICD-10-CM | POA: Diagnosis not present

## 2019-03-13 NOTE — Progress Notes (Signed)
Gynecology Annual Exam   History of Present Illness: Lindsey Sellers is a 33 y.o. married female presenting for an annual exam. She has no complaints today. She is sexually active. She denies dyspareunia. She does perform self breast exams. There is no notable family history of breast or ovarian cancer in her family. She denies current symptoms of depression.    Past Medical History:  Past Medical History:  Diagnosis Date  . Abnormal pap 04/2006  . 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 child birth  03/05/2018  . COLPOSCOPY  2008   no biopsy done  . WISDOM TOOTH EXTRACTION      Gynecologic History:  LMP: Patient's last menstrual period was 02/26/2019. Average Interval: regular Heavy Menses: no Clots: no Intermenstrual Bleeding: no Postcoital Bleeding: no Dysmenorrhea: no Contraception: condoms Last Pap: completed on 08/10/16; result was: no abnormalities   Obstetric History: G1P1001  Family History:  Family History  Problem Relation Age of Onset  . Cancer Paternal Grandmother        lymph node  . Gout Father   . Colon cancer Neg Hx   . Breast cancer Neg Hx     Social History:  Social History   Socioeconomic History  . Marital status: Married    Spouse name: Not on file  . Number of children: Not on file  . Years of education: Not on file  . Highest education level: Not on file  Occupational History  . Not on file  Social Needs  . Financial resource strain: Not on file  . Food insecurity    Worry: Not on file    Inability: Not on file  . Transportation needs    Medical: Not on file    Non-medical: Not on file  Tobacco Use  . Smoking status: Never Smoker  . Smokeless tobacco: Never Used  Substance and Sexual Activity  . Alcohol use: Not Currently    Alcohol/week: 5.0 standard drinks    Types: 2 Glasses of wine, 3 Standard drinks  or equivalent per week  . Drug use: No  . Sexual activity: Yes    Partners: Male    Birth control/protection: None  Lifestyle  . Physical activity    Days per week: Not on file    Minutes per session: Not on file  . Stress: Not on file  Relationships  . Social Herbalist on phone: Not on file    Gets together: Not on file    Attends religious service: Not on file    Active member of club or organization: Not on file    Attends meetings of clubs or organizations: Not on file    Relationship status: Not on file  . Intimate partner violence    Fear of current or ex partner: Not on file    Emotionally abused: Not on file    Physically abused: Not on file    Forced sexual activity: Not on file  Other Topics Concern  . 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 Authorizing Provider  Prenatal Vit-Fe Fumarate-FA (PRENATAL VITAMIN PO) Take by mouth.   Yes [provider]    Review of Systems: negative except noted in HPI  Physical Exam Vitals: BP 122/89   Pulse 85   Ht 5\' 6"  (1.676 m)   Wt 154 lb (69.9 kg)   LMP 02/26/2019   Breastfeeding No   BMI 24.86 kg/m  General: NAD HEENT: normocephalic, atraumatic Thyroid: no enlargement, no palpable nodules Pulmonary: Normal rate and effort, CTAB Cardiovascular: RRR Breast: Breast symmetrical, no tenderness, no palpable nodules or masses, no skin or nipple retraction present, no nipple discharge. No axillary or supraclavicular lymphadenopathy. Abdomen: soft, non-tender, non-distended. No hepatomegaly, splenomegaly or masses palpable. No evidence of hernia  Genitourinary: deferred Extremities: no edema,  erythema, or tenderness Neurologic: Grossly intact Psychiatric: mood appropriate, affect full  Female chaperone present for pelvic and breast portions of the physical exam  Assessment:  1. Well woman exam    Plan: Continue PNV in case unintended pregnancy occurs She is considering another pregnancy but no time soon Follow up with GYN in 1 year or prn Follow up with PCP as scheduled  Julianne Handler, CNM 03/13/2019 12:18 PM

## 2019-07-10 ENCOUNTER — Encounter: Payer: Self-pay | Admitting: Certified Nurse Midwife

## 2019-08-03 ENCOUNTER — Encounter: Payer: Self-pay | Admitting: *Deleted

## 2019-08-03 DIAGNOSIS — Z3480 Encounter for supervision of other normal pregnancy, unspecified trimester: Secondary | ICD-10-CM | POA: Insufficient documentation

## 2019-08-04 ENCOUNTER — Ambulatory Visit (INDEPENDENT_AMBULATORY_CARE_PROVIDER_SITE_OTHER): Payer: No Typology Code available for payment source | Admitting: Certified Nurse Midwife

## 2019-08-04 ENCOUNTER — Other Ambulatory Visit: Payer: Self-pay

## 2019-08-04 ENCOUNTER — Other Ambulatory Visit (HOSPITAL_COMMUNITY)
Admission: RE | Admit: 2019-08-04 | Discharge: 2019-08-04 | Disposition: A | Discharge status: Home / Self Care | Payer: No Typology Code available for payment source | Source: Ambulatory Visit | Attending: Certified Nurse Midwife | Admitting: Certified Nurse Midwife

## 2019-08-04 ENCOUNTER — Encounter: Payer: Self-pay | Admitting: Certified Nurse Midwife

## 2019-08-04 DIAGNOSIS — Z3480 Encounter for supervision of other normal pregnancy, unspecified trimester: Secondary | ICD-10-CM

## 2019-08-04 DIAGNOSIS — O09291 Supervision of pregnancy with other poor reproductive or obstetric history, first trimester: Secondary | ICD-10-CM | POA: Diagnosis not present

## 2019-08-04 DIAGNOSIS — Z8679 Personal history of other diseases of the circulatory system: Secondary | ICD-10-CM | POA: Diagnosis not present

## 2019-08-04 DIAGNOSIS — O09299 Supervision of pregnancy with other poor reproductive or obstetric history, unspecified trimester: Secondary | ICD-10-CM

## 2019-08-04 DIAGNOSIS — Z3A10 10 weeks gestation of pregnancy: Secondary | ICD-10-CM | POA: Diagnosis not present

## 2019-08-04 DIAGNOSIS — O09521 Supervision of elderly multigravida, first trimester: Secondary | ICD-10-CM | POA: Diagnosis not present

## 2019-08-04 DIAGNOSIS — O09529 Supervision of elderly multigravida, unspecified trimester: Secondary | ICD-10-CM

## 2019-08-04 MED ORDER — ASPIRIN EC 81 MG PO TBEC: 81.0000 mg | DELAYED_RELEASE_TABLET | Freq: Every day | ORAL | 0 refills | Status: DC

## 2019-08-04 MED FILL — ASPIRIN 81 MG TBEC: 81 | 30 days supply | Qty: 30 | Fill #0

## 2019-08-04 NOTE — Progress Notes (Signed)
History:   Lindsey Sellers is a 34 y.o. G2P1001 at 43w2dby LMP, consistent with early UKoreaperformed today, being seen today for her first obstetrical visit.  Her obstetrical history is significant for advanced maternal age and postpartum hypertension. Patient does intend to breast feed. Pregnancy history fully reviewed.  Patient reports nausea.     HISTORY: OB History  Gravida Para Term Preterm AB Living  _0 0 0 1  SAB TAB Ectopic Multiple Live Births  0 0 0 0 1    # Outcome Date GA Lbr Len/2nd Weight Sex Delivery Anes PTL Lv  2 Current           1 Term 03/05/18 440w2d7:27 / 02:00 8 lb 5.4 oz (3.782 kg) M Vag-Spont None  LIV     Birth Comments: WNL     Name: Choi,BOY Creedence     Apgar1: 9  Apgar5: 9    Last pap smear was done 07/2016 and was normal  Past Medical History:  Diagnosis Date  . Abnormal pap 04/2006  . 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 child birth  03/05/2018  . COLPOSCOPY  2008   no biopsy done  . WISDOM TOOTH EXTRACTION     Family History  Problem Relation Age of Onset  . Cancer Paternal Grandmother        lymph node  . Gout Father   . Colon cancer Neg Hx   . Breast cancer Neg Hx    Social History   Tobacco Use  . Smoking status: Never Smoker  . Smokeless tobacco: Never Used  Substance Use Topics  . Alcohol use: Not Currently    Alcohol/week: 5.0 standard drinks    Types: 2 Glasses of wine, 3 Standard drinks or equivalent per week  . Drug use: No   No Known Allergies Current Outpatient Medications on File Prior to Visit  Medication Sig Dispense Refill  . Prenatal Vit-Fe Fumarate-FA (PRENATAL VITAMIN PO) Take by mouth.     No current facility-administered medications on file prior to visit.    Review of Systems Pertinent items noted in HPI and remainder of comprehensive ROS otherwise negative. Physical Exam:   Vitals:   08/04/19  0929  BP: 131/86  Pulse: 76  Weight: 153 lb (69.4 kg)   Fetal Heart Rate (bpm): 172 Pelvic Exam: Perineum: no hemorrhoids, normal perineum   Vulva: normal external genitalia, no lesions   Vagina:  normal mucosa, normal discharge   Cervix: no lesions and normal, pap smear done.    Adnexa: normal adnexa and no mass, fullness, tenderness   Bony Pelvis: average  System: General: well-developed, well-nourished female in no acute distress   Breasts:  normal appearance, no masses or tenderness bilaterally   Skin: normal coloration and turgor, no rashes   Neurologic: oriented, normal, negative, normal mood   Extremities: normal strength, tone, and muscle mass, ROM of all joints is normal   HEENT PERRLA, extraocular movement intact and sclera clear, anicteric   Mouth/Teeth mucous membranes moist, pharynx normal without lesions and dental hygiene good   Neck supple and no masses   Cardiovascular: regular rate and rhythm   Respiratory:  no respiratory distress, normal breath sounds   Abdomen: soft, non-tender; bowel sounds normal; no masses,  no organomegaly     Assessment:    Pregnancy: G2P1001 Patient Active Problem List   Diagnosis  Date Noted  . AMA (advanced maternal age) multigravida 35+ 08/04/2019  . History of maternal third degree perineal laceration, currently pregnant 08/04/2019  . Hx of postpartum hemorrhage, currently pregnant 08/04/2019  . History of postpartum hypertension 08/04/2019  . Supervision of other normal pregnancy, antepartum 08/03/2019     Plan:    1. Supervision of other normal pregnancy, antepartum - Welcomed back to practice  - Patient doing well, reports occasional nausea and metallic taste in mouth - patient reports nausea and taste have gotten better over the past couple of weeks, offered patient medication for nausea and she denied  - Reviewed safety, visitor policy, reassurance about COVID-19 for pregnancy at this time. Discussed possible changes to  visits, including televisits, that may occur due to COVID-19.  The office remains open if pt needs to be seen and [facility] is open 24 hours/day for OB emergencies. - Routine prenatal care  - Anticipatory guidance on upcoming appointments including mychart appointments  - Obstetric panel - HIV antibody (with reflex) - Hepatitis C Antibody - [name] bedside; Future - Culture, OB Urine - Babyscripts Schedule Optimization - Cytology - PAP( [place]) - Genetic Screening  2. Antepartum multigravida of advanced maternal age - 34yo at time of delivery, no additional antenatal screening needed  3. History of maternal third degree perineal laceration, currently pregnant - 3A laceration   4. Hx of postpartum hemorrhage, currently pregnant - Patient reports receiving TXA and methergine shot after delivery for PPH - EBL 1062 based on chart review  - Discussed with patient that we will give TXA prior to delivery and cytotec immediately after delivery to prevent recurring PPH   5. History of postpartum hypertension - Patient reports after discharge coming back into hospital for vaginal bleeding and noted to have elevated BP, was started on Norvasc 55m daily until postpartum appointment  - baseline labs obtained today and discussed starting patient on bASA at 12 weeks  - Protein / creatinine ratio, urine - Comp Met (CMET) - aspirin EC 81 MG tablet; Take 1 tablet (81 mg total) by mouth daily. Take after 12 weeks for prevention of preeclampsia later in pregnancy  Dispense: 300 tablet; Refill: 0   Initial labs drawn. Continue prenatal vitamins. Genetic Screening discussed, NIPS: ordered. Ultrasound discussed; fetal anatomic survey: requested. Problem list reviewed and updated. The nature of [facility] MDs and other Advanced Practice Providers was explained to patient; also emphasized that residents, students are part of our team. Routine obstetric  precautions reviewed. Return in about 4 weeks (around 09/01/2019) for ROB-mychart.     VLajean Manes CNew Harmonyfor WDean Foods Company CRemerton

## 2019-08-04 NOTE — Patient Instructions (Addendum)
Safe Medications in Pregnancy   Acne: Benzoyl Peroxide Salicylic Acid  Backache/Headache: Tylenol: 2 regular strength every 4 hours OR              2 Extra strength every 6 hours  Colds/Coughs/Allergies: Benadryl (alcohol free) 25 mg every 6 hours as needed Breath right strips Claritin Cepacol throat lozenges Chloraseptic throat spray Cold-Eeze- up to three times per day Cough drops, alcohol free Flonase (by prescription only) Guaifenesin Mucinex Robitussin DM (plain only, alcohol free) Saline nasal spray/drops Sudafed (pseudoephedrine) & Actifed ** use only after [redacted] weeks gestation and if you do not have high blood pressure Tylenol Vicks Vaporub Zinc lozenges Zyrtec   Constipation: Colace Ducolax suppositories Fleet enema Glycerin suppositories Metamucil Milk of magnesia Miralax Senokot Smooth move tea  Diarrhea: Kaopectate Imodium A-D  *NO pepto Bismol  Hemorrhoids: Anusol Anusol HC Preparation H Tucks  Indigestion: Tums Maalox Mylanta Zantac  Pepcid  Insomnia: Benadryl (alcohol free) 25mg  every 6 hours as needed Tylenol PM Unisom, no Gelcaps  Leg Cramps: Tums MagGel  Nausea/Vomiting:  Bonine Dramamine Emetrol Ginger extract Sea bands Meclizine  Nausea medication to take during pregnancy:  Unisom (doxylamine succinate 25 mg tablets) Take one tablet daily at bedtime. If symptoms are not adequately controlled, the dose can be increased to a maximum recommended dose of two tablets daily (1/2 tablet in the morning, 1/2 tablet mid-afternoon and one at bedtime). Vitamin B6 100mg  tablets. Take one tablet twice a day (up to 200 mg per day).  Skin Rashes: Aveeno products Benadryl cream or 25mg  every 6 hours as needed Calamine Lotion 1% cortisone cream  Yeast infection: Gyne-lotrimin 7 Monistat 7   **If taking multiple medications, please check labels to avoid duplicating the same active ingredients **take medication as directed on  the label ** Do not exceed 4000 mg of tylenol in 24 hours **Do not take medications that contain aspirin or ibuprofen     First Trimester of Pregnancy  The first trimester of pregnancy is from week 1 until the end of week 13 (months 1 through 3). During this time, your baby will begin to develop inside you. At 6-8 weeks, the eyes and face are formed, and the heartbeat can be seen on ultrasound. At the end of 12 weeks, all the baby's organs are formed. Prenatal care is all the medical care you receive before the birth of your baby. Make sure you get good prenatal care and follow all of your doctor's instructions. Follow these instructions at home: Medicines  Take over-the-counter and prescription medicines only as told by your doctor. Some medicines are safe and some medicines are not safe during pregnancy.  Take a prenatal vitamin that contains at least 600 micrograms (mcg) of folic acid.  If you have trouble pooping (constipation), take medicine that will make your stool soft (stool softener) if your doctor approves. Eating and drinking   Eat regular, healthy meals.  Your doctor will tell you the amount of weight gain that is right for you.  Avoid raw meat and uncooked cheese.  If you feel sick to your stomach (nauseous) or throw up (vomit): ? Eat 4 or 5 small meals a day instead of 3 large meals. ? Try eating a few soda crackers. ? Drink liquids between meals instead of during meals.  To prevent constipation: ? Eat foods that are high in fiber, like fresh fruits and vegetables, whole grains, and beans. ? Drink enough fluids to keep your pee (urine) clear or pale yellow.  Activity  Exercise only as told by your doctor. Stop exercising if you have cramps or pain in your lower belly (abdomen) or low back.  Do not exercise if it is too hot, too humid, or if you are in a place of great height (high altitude).  Try to avoid standing for long periods of time. Move your legs often  if you must stand in one place for a long time.  Avoid heavy lifting.  Wear low-heeled shoes. Sit and stand up straight.  You can have sex unless your doctor tells you not to. Relieving pain and discomfort  Wear a good support bra if your breasts are sore.  Take warm water baths (sitz baths) to soothe pain or discomfort caused by hemorrhoids. Use hemorrhoid cream if your doctor says it is okay.  Rest with your legs raised if you have leg cramps or low back pain.  If you have puffy, bulging veins (varicose veins) in your legs: ? Wear support hose or compression stockings as told by your doctor. ? Raise (elevate) your feet for 15 minutes, 3-4 times a day. ? Limit salt in your food. Prenatal care  Schedule your prenatal visits by the twelfth week of pregnancy.  Write down your questions. Take them to your prenatal visits.  Keep all your prenatal visits as told by your doctor. This is important. Safety  Wear your seat belt at all times when driving.  Make a list of emergency phone numbers. The list should include numbers for family, friends, the hospital, and police and fire departments. General instructions  Ask your doctor for a referral to a local prenatal class. Begin classes no later than at the start of month 6 of your pregnancy.  Ask for help if you need counseling or if you need help with nutrition. Your doctor can give you advice or tell you where to go for help.  Do not use hot tubs, steam rooms, or saunas.  Do not douche or use tampons or scented sanitary pads.  Do not cross your legs for long periods of time.  Avoid all herbs and alcohol. Avoid drugs that are not approved by your doctor.  Do not use any tobacco products, including cigarettes, chewing tobacco, and electronic cigarettes. If you need help quitting, ask your doctor. You may get counseling or other support to help you quit.  Avoid cat litter boxes and soil used by cats. These carry germs that can  cause birth defects in the baby and can cause a loss of your baby (miscarriage) or stillbirth.  Visit your dentist. At home, brush your teeth with a soft toothbrush. Be gentle when you floss. Contact a doctor if:  You are dizzy.  You have mild cramps or pressure in your lower belly.  You have a nagging pain in your belly area.  You continue to feel sick to your stomach, you throw up, or you have watery poop (diarrhea).  You have a bad smelling fluid coming from your vagina.  You have pain when you pee (urinate).  You have increased puffiness (swelling) in your face, hands, legs, or ankles. Get help right away if:  You have a fever.  You are leaking fluid from your vagina.  You have spotting or bleeding from your vagina.  You have very bad belly cramping or pain.  You gain or lose weight rapidly.  You throw up blood. It may look like coffee grounds.  You are around people who have [name] measles, fifth disease,  or chickenpox.  You have a very bad headache.  You have shortness of breath.  You have any kind of trauma, such as from a fall or a car accident. Summary  The first trimester of pregnancy is from week 1 until the end of week 13 (months 1 through 3).  To take care of yourself and your unborn baby, you will need to eat healthy meals, take medicines only if your doctor tells you to do so, and do activities that are safe for you and your baby.  Keep all follow-up visits as told by your doctor. This is important as your doctor will have to ensure that your baby is healthy and growing well. This information is not intended to replace advice given to you by your health care provider. Make sure you discuss any questions you have with your health care provider. Document Revised: 07/31/2018 Document Reviewed: 04/17/2016 Elsevier Patient Education  2020 [employer].

## 2019-08-04 NOTE — Progress Notes (Signed)
Bedside U/S shows single IUP with FHT of 172 BPM and CRL measures 37.67mm  GA [redacted]w[redacted]d

## 2019-08-06 LAB — CYTOLOGY - PAP
Chlamydia: NEGATIVE
Comment: NEGATIVE
Comment: NEGATIVE
Comment: NORMAL
Diagnosis: NEGATIVE
High risk HPV: NEGATIVE
Neisseria Gonorrhea: NEGATIVE

## 2019-08-07 LAB — URINE CULTURE, OB REFLEX

## 2019-08-07 LAB — CULTURE, OB URINE

## 2019-08-11 ENCOUNTER — Encounter: Payer: Self-pay | Admitting: *Deleted

## 2019-08-11 DIAGNOSIS — Z3480 Encounter for supervision of other normal pregnancy, unspecified trimester: Secondary | ICD-10-CM

## 2019-08-11 NOTE — Progress Notes (Signed)
Pt's NIPT came back with HR for Trisomy 21.  Lab scanned and routed to Wende Bushy, CNM.  Ambulatory referral to MFM made to genetics.

## 2019-08-12 ENCOUNTER — Other Ambulatory Visit: Payer: Self-pay | Admitting: *Deleted

## 2019-08-12 ENCOUNTER — Other Ambulatory Visit: Payer: Self-pay | Admitting: Certified Nurse Midwife

## 2019-08-12 DIAGNOSIS — O285 Abnormal chromosomal and genetic finding on antenatal screening of mother: Secondary | ICD-10-CM

## 2019-08-12 LAB — OBSTETRIC PANEL
Absolute Monocytes: 492 cells/uL (ref 200–950)
Antibody Screen: POSITIVE — AB
Basophils Absolute: 16 cells/uL (ref 0–200)
Basophils Relative: 0.2 %
Eosinophils Absolute: 41 cells/uL (ref 15–500)
Eosinophils Relative: 0.5 %
HCT: 39.6 % (ref 35.0–45.0)
Hemoglobin: 13.3 g/dL (ref 11.7–15.5)
Hepatitis B Surface Ag: NONREACTIVE
Lymphs Abs: 1804 cells/uL (ref 850–3900)
MCH: 31.9 pg (ref 27.0–33.0)
MCHC: 33.6 g/dL (ref 32.0–36.0)
MCV: 95 fL (ref 80.0–100.0)
MPV: 11.3 fL (ref 7.5–12.5)
Monocytes Relative: 6 %
Neutro Abs: 5847 cells/uL (ref 1500–7800)
Neutrophils Relative %: 71.3 %
Platelets: 235 10*3/uL (ref 140–400)
RBC: 4.17 10*6/uL (ref 3.80–5.10)
RDW: 12.2 % (ref 11.0–15.0)
RPR Ser Ql: NONREACTIVE
Rubella: 2.99 Index
Total Lymphocyte: 22 %
WBC: 8.2 10*3/uL (ref 3.8–10.8)

## 2019-08-12 LAB — COMPREHENSIVE METABOLIC PANEL
AG Ratio: 1.6 (calc) (ref 1.0–2.5)
ALT: 14 U/L (ref 6–29)
AST: 15 U/L (ref 10–30)
Albumin: 4.4 g/dL (ref 3.6–5.1)
Alkaline phosphatase (APISO): 41 U/L (ref 31–125)
BUN: 7 mg/dL (ref 7–25)
CO2: 22 mmol/L (ref 20–32)
Calcium: 9.6 mg/dL (ref 8.6–10.2)
Chloride: 102 mmol/L (ref 98–110)
Creat: 0.57 mg/dL (ref 0.50–1.10)
Globulin: 2.8 g/dL (calc) (ref 1.9–3.7)
Glucose, Bld: 75 mg/dL (ref 65–99)
Potassium: 3.8 mmol/L (ref 3.5–5.3)
Sodium: 135 mmol/L (ref 135–146)
Total Bilirubin: 0.4 mg/dL (ref 0.2–1.2)
Total Protein: 7.2 g/dL (ref 6.1–8.1)

## 2019-08-12 LAB — PROTEIN / CREATININE RATIO, URINE
Creatinine, Urine: 195 mg/dL (ref 20–275)
Protein/Creat Ratio: 92 mg/g creat (ref 21–161)
Protein/Creatinine Ratio: 0.092 mg/mg creat (ref 0.021–0.16)
Total Protein, Urine: 18 mg/dL (ref 5–24)

## 2019-08-12 LAB — ANTIBODY ID, TITER, AND TYPING, RBC
Antigen Typing (2): NEGATIVE
Titer: 1:32 {titer}

## 2019-08-12 LAB — HEPATITIS C ANTIBODY
Hepatitis C Ab: NONREACTIVE
SIGNAL TO CUT-OFF: 0.01 (ref <1.00)

## 2019-08-12 LAB — HIV ANTIBODY (ROUTINE TESTING W REFLEX): HIV 1&2 Ab, 4th Generation: NONREACTIVE

## 2019-08-12 NOTE — Progress Notes (Unsigned)
Spoke with MFM and genetics to schedule genetic counseling for 08/31/19 7:30

## 2019-08-17 ENCOUNTER — Ambulatory Visit (HOSPITAL_COMMUNITY): Payer: No Typology Code available for payment source

## 2019-08-27 ENCOUNTER — Ambulatory Visit (HOSPITAL_COMMUNITY): Payer: No Typology Code available for payment source

## 2019-08-27 ENCOUNTER — Other Ambulatory Visit: Payer: Self-pay | Admitting: *Deleted

## 2019-08-27 ENCOUNTER — Ambulatory Visit (HOSPITAL_COMMUNITY): Payer: No Typology Code available for payment source | Attending: Certified Nurse Midwife

## 2019-08-27 ENCOUNTER — Ambulatory Visit: Payer: Self-pay | Admitting: Genetic Counselor

## 2019-08-27 ENCOUNTER — Other Ambulatory Visit: Payer: Self-pay

## 2019-08-27 ENCOUNTER — Ambulatory Visit (HOSPITAL_BASED_OUTPATIENT_CLINIC_OR_DEPARTMENT_OTHER): Payer: No Typology Code available for payment source | Admitting: Genetic Counselor

## 2019-08-27 ENCOUNTER — Ambulatory Visit: Payer: No Typology Code available for payment source | Admitting: *Deleted

## 2019-08-27 VITALS — BP 137/79 | HR 105 | Temp 98.1°F | Wt 156.4 lb

## 2019-08-27 DIAGNOSIS — O09521 Supervision of elderly multigravida, first trimester: Secondary | ICD-10-CM | POA: Diagnosis not present

## 2019-08-27 DIAGNOSIS — O280 Abnormal hematological finding on antenatal screening of mother: Secondary | ICD-10-CM | POA: Diagnosis not present

## 2019-08-27 DIAGNOSIS — Z8759 Personal history of other complications of pregnancy, childbirth and the puerperium: Secondary | ICD-10-CM | POA: Diagnosis present

## 2019-08-27 DIAGNOSIS — Z3480 Encounter for supervision of other normal pregnancy, unspecified trimester: Secondary | ICD-10-CM

## 2019-08-27 DIAGNOSIS — O09299 Supervision of pregnancy with other poor reproductive or obstetric history, unspecified trimester: Secondary | ICD-10-CM | POA: Insufficient documentation

## 2019-08-27 DIAGNOSIS — Z315 Encounter for genetic counseling: Secondary | ICD-10-CM | POA: Diagnosis not present

## 2019-08-27 DIAGNOSIS — Z8679 Personal history of other diseases of the circulatory system: Secondary | ICD-10-CM | POA: Diagnosis present

## 2019-08-27 DIAGNOSIS — O285 Abnormal chromosomal and genetic finding on antenatal screening of mother: Secondary | ICD-10-CM

## 2019-08-27 DIAGNOSIS — Z3A36 36 weeks gestation of pregnancy: Secondary | ICD-10-CM | POA: Diagnosis not present

## 2019-08-27 DIAGNOSIS — Z3A13 13 weeks gestation of pregnancy: Secondary | ICD-10-CM | POA: Diagnosis not present

## 2019-08-27 DIAGNOSIS — O289 Unspecified abnormal findings on antenatal screening of mother: Secondary | ICD-10-CM | POA: Diagnosis not present

## 2019-08-27 DIAGNOSIS — O358XX0 Maternal care for other (suspected) fetal abnormality and damage, not applicable or unspecified: Secondary | ICD-10-CM

## 2019-08-27 NOTE — Progress Notes (Unsigned)
Maternal-Fetal Medicine  Name: Lindsey Sellers Provider: Darrol Poke, CNM MRN: [id number]  I had the pleasure of seeing Lindsey Sellers today at the [facility]. She is G2 P1 at 13w 4d gestation and is here for ultrasound and genetic counseling. On cell-free fetal DNA screening, the risk for Down syndrome is increased. 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  Obstetric history is significant for a term vaginal delivery in November 2019 of a female infant weighing 8-5 at birth. Her son is in good health.  Following delivery, she had postpartum hemorrhage and received 2 units of blood. She was readmitted 12 days later with severe vaginal bleeding and received 2 more units of blood. Past medical history: No history of diabetes or hypertension or any chronic medical conditions. She received 2 doses of COVID vaccine. PSH: Wisdom tooth surgery. Allergies: NKDA  On today's ultrasound, the CRL measurement is consistent with her previously established dates and good fetal heart activity is seen. 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 Fetal anatomy that could be ascertained at this gestational age is normal.  Patient met with our genetic counselor after ultrasound and had detailed consultation on cystic hygroma and abnormal cell-free fetal DNA screening. You will be receiving a separate letter from our genetic counselor. Her husband was present at counseling.  I counseled the couple on the following: Anti-K antibodies: -I explained the significance of these antibodies. She has most-likely acquired these antibodies from her previous blood transfusion after delivery. -Patient is Kell negative and exposure to K antigen leads to the immune response and production of anti-K antibodies. She has significant amount of antibodies (titer 1:32). -She understands that her blood type is A positive and this is  not rhesus isoimmunization. -If fetus is Kell positive, passive transfer of these antibodies can lead to fetal hemolysis and bone marrow suppression both leading to fetal anemia. -Fetal anemia can be detected by noninvasive ultrasound using middle-cerebral artery Doppler studies. If fetal anemia is suspected, fetal blood sampling to confirm anemia would be recommended. Fetal blood transfusion would be performed if fetal anemia is confirmed. -About 92% of Caucasian population are Kell negative. If her husband is Kell negative, the fetus will be Kell negative and hemolysis and fetal anemia is unlikely. No fetal surveillance is then necessary. -If he is Kell positive, there is a 50% likelihood that the fetus will be Kell positive (50% chance it is Kell negative). We gave CPT code for screening Kell antigen and her husband will confirm from his insurance. If he is Kell positive, we recommend amniocentesis to determine fetal Kell antigen type. Patient and her husband understood the implications of this screening and informed that her husband will undergo screening after contacting his insurance company.  Fetal cystic hygroma: I briefly discussed cystic hygroma (detailed counseling by our genetic counselor). I recommended invasive testing for fetal karyotype analysis. Chorionic villus sampling ([facility]) can be performed now, but carriers a slightly higher risk of isoimmunization. Amniocentesis is preferable unless that patient is anxious to know fetal karyotype earlier and is planning termination of pregnancy in early second trimester. Patient informed she would like to wait till 16 weeks when amniocentesis will be performed.  Recommendations: -Kell antigen typing (husband). -Amniocentesis for fetal karyotype and reflex microarray in 3 weeks. Fetal Kell antigen typing if father is Kell positive. -If fetus is K positive, serial middle-cerebral artery Dopplers to rule out  fetal anemia.  Consultation including  face-to-face counseling: 30 minutes.

## 2019-08-27 NOTE — Progress Notes (Signed)
08/27/2019  Lindsey Sellers [birth year] MRN: [id number] DOV: 08/27/2019  Lindsey Sellers presented to the [facility] for a genetics consultation regarding noninvasive prenatal screening that was high-risk for trisomy 15. Lindsey Sellers was accompanied to her appointment by her husband, Lindsey Sellers.   Indication for genetic counseling - Noninvasive prenatal screening high risk for trisomy 56  Prenatal history  Lindsey Sellers is a G57P1001, 34 y.o. female. Her current pregnancy has completed [redacted]w[redacted]d (Estimated Date of Delivery: 02/28/20).  Lindsey Sellers denied exposure to environmental toxins or chemical agents. She denied the use of alcohol, tobacco or street drugs. She reported taking prenatal vitamins and aspirin. She denied significant viral illnesses, fevers, and bleeding during the course of her pregnancy. Her medical and surgical histories were noncontributory.  Family History  A three generation pedigree was drafted and reviewed. Both family histories were reviewed and found to be noncontributory for birth defects, intellectual disability, recurrent pregnancy loss, and known genetic conditions.    The patient's ethnicity is [place]. The father of the pregnancy's ethnicity is Caucasian. Ashkenazi Jewish ancestry and consanguinity were denied. Pedigree will be scanned under Media.  Discussion  NIPS result & Down syndrome:  Lindsey Sellers was referred for genetic counseling as results from noninvasive prenatal screening (NIPS) came back positive for an increased risk of trisomy 46, aka Down syndrome, in the current pregnancy. Down syndrome is one of the most common extra chromosome conditions, as approximately 1 in 800 babies are born with this condition. There are different types of Down syndrome, with each type determined by the arrangement of the chromosome 21 pair. Approximately 95% of cases are caused by an entire extra copy of chromosome 21 (trisomy 21), and 2-4% of cases are due  to a chromosomal rearrangement (translocation) involving chromosome 21. We reviewed that Down syndrome most commonly occurs by chance due to an error in chromosomal division during the formation of egg and sperm cells in a process called nondisjunction.    Down syndrome is characterized by a distinctive facial appearance, mild to moderate intellectual disability, and an increased chance for a heart defect. Approximately half of babies with Down syndrome are born with a heart defect that may require surgery after birth. While many children with Down syndrome look similar to each other, each child with Down syndrome is unique and will have many more features in common with his or her own family members. Children with Down syndrome also have an increased chance for thyroid problems, which can range from an underactive to an overactive thyroid. Additionally, low muscle tone, gastrointestinal abnormalities, vision problems, and respiratory and ear infections are more common among babies with Down syndrome. We discussed that there are many more features that can be associated with Down syndrome; however, it is not possible to accurately predict all features that would be present in an individual with Down syndrome prenatally. Additionally, there is a high degree of variability seen among children who have this condition, meaning that every child with Down syndrome will not be affected in exactly the same way, and some children will have more or less features than others. It is not possible to predict what strengths and weaknesses a child with Down syndrome will have, just like it is not possible to predict this for any child.    Lindsey Sellers was also counseled that there is an increased fetal loss rate associated with Down syndrome, with up to 30% miscarrying between 12 weeks' gestation and term.  Of affected liveborn infants, 90% survive the first 10 years of life. With the advances in medical technology, early  intervention, and supportive therapies, many individuals with Down syndrome are able to live with an increasing degree of independence. Today, many adults with Down syndrome care for themselves, have jobs, and often live in group homes or apartments where assistance is available if needed.   We reviewed that NIPS analyzes cell free DNA originating from the placenta that is found in the maternal blood circulation during pregnancy. This test can provide information regarding the presence or absence of extra fetal DNA for chromosomes 13, 18 and 21 as well as the sex chromosomes. The reported detection rate is greater than 99% for trisomy 21, greater than 99% for trisomy 18, greater than 91% for trisomy 13, and greater than 94% for monosomy X. However, it cannot be considered diagnostic for chromosome conditions. Positive predictive value (PPV) is the probability that a pregnancy with a positive test result is truly affected. The PPV reported by the NIPS laboratory for trisomy 21 in the current pregnancy was estimated to be 90%. The PPV calculator offered by the [employer] estimated PPV for trisomy 21 in the current pregnancy to be 82%. Thus, there is a 82-90% chance that this could be a true positive result.  Lindsey Sellers was counseled that there are several possible explanations for her high risk NIPS result. Firstly, the fetus could truly be affected by Down syndrome. Secondly, the fetus could be mosaic for Down syndrome, though this is rare. Mosaicism occurs when an individual has two or more genetically different sets of cells in their body. Individuals who are mosaic for Down syndrome have some cells in the body with trisomy 21 and may have other cells that are chromosomally normal. We discussed that it would not be possible to tell which features an individual with mosaic Down syndrome may have, as it is impossible to assess which specific cells and  tissues in the body have Down syndrome. Individuals who are mosaic for Down syndrome may be more mildly affected than individuals with full trisomy 21, though this is not always the case. Thirdly, the placenta could have Down syndrome while the fetus could be unaffected. This is a phenomenon known as confined placental mosaicism (CPM). Lastly, this could be a false positive result that resulted due to normal variation.  Ultrasound findings:  A limited first trimester ultrasound was performed today prior to our visit. The ultrasound report will be sent under separate cover. A cystic hygroma was noted on today's ultrasound. The couple was counseled that a cystic hygroma describes a septated fluid filled sac at the back of the fetal neck that typically results from failure of the fetal lymphatic system. We discussed that 50-70% of fetuses with a cystic hygroma have a chromosomal aneuploidy, including Down syndrome. Given the finding of a cystic hygroma on today's ultrasound along with the high-risk NIPS result, it is suspected that the current fetus has Down syndrome.  Diagnostic testing:  Lindsey Sellers was counseled regarding the option of diagnostic testing via chorionic villus sampling (CVS) or amniocentesis. We discussed the technical aspects of each procedure and quoted up to a 1 in 500 (0.2%) risk for spontaneous pregnancy loss or other adverse pregnancy outcomes as a result of either procedure. Cultured cells from either a placental or amniotic fluid sample allow for the visualization of a fetal karyotype, which can detect >99% of chromosomal aberrations.  Chromosomal microarray can also be performed to identify smaller deletions or duplications of fetal chromosomal material. LindseySellers was informed that diagnostic testing would be the only way todefinitively determineif the fetus hasDown syndrome prenatally.If results were to come back positive, it could impact pregnancy management in several different  ways.We discussedthat some individualsmay choose toenda pregnancyor consider adoptionifa diagnosis of Down syndrome were confirmed in the fetus. Forindividualswho would not alter their pregnancy management regardless oftesting outcomes,a prenataldiagnosiscouldallow for delivery planing and prenatalconsults with specialists that would be involved intheinfant's care,and time to planand prepareemotionally,physically,and financially. Additionally, diagnostic testing can help to inform recurrence risks for future pregnancies. Diagnostic testing could also aid in determining the level of mosaicism that is present if the fetus is mosaicfor Down syndrome.  We reviewed the benefits and limitations of CVS versus amniocentesis. CVS is able to provide diagnostic information earlier, which allows for more pregnancy management options to remain available. Lindsey Sellers was unsure of whether or not if she would continue the pregnancy if a diagnosis of Down syndrome is confirmed. CVS would allow her more time to make that decision. The couple was informed that it is an option to end a pregnancy until 20-22 weeks'gestation in the state of [place] depending on the clinic.Termination is still an option beyond 22 weeks' gestation in other states. If termination of pregnancy is not being considered, having diagnostic information earlier allows more time to plan and prepare for the arrival of a baby with Down syndrome. A limitation to CVS is the possibility of confined placental mosaicism. CVS assesses placental tissue which should be representative of the fetus given that they are made up of the same sperm and egg cells; however, in1-2% of cases, confined placental mosaicism may be ofconcern. If mosaicism was suspected based on results from CVS, it would be recommended that follow-up with amniocentesis be performed to assess whether mosaicism is present in the fetus (as amniotic fluid contains cells  directly from the fetus) or is confined to the placenta.  LindseySellers was also made aware that she has the option ofcontinuingwithstandard ultrasoundsand pursuinggenetic testing postnatally.We discussed that approximately 50% offetuses withDown syndrome demonstratesigns of the condition onanatomy ultrasound. Additionally, fetal echocardiogram for a deeper assessment of cardiac defectsis recommended given the increased risk for congenital heart defects associated with Down syndrome.  Advanced paternal age:  Given that Lindsey Sellers partner will be 21 years old at the time of delivery, we also reviewed considerations associated with advanced paternal age. Advanced paternal age (APA) is defined as greater than 2 years old at time of conception. The chance of de novo (new) single gene mutations increases with paternal age. We discussed an available screening option for conditions associated with advanced paternal age called [facility]ara. [facility]ara utilizes noninvasive prenatal screening (NIPS) to screena pregnancyfor 25 autosomal or X-linked dominant single gene disorders. These include conditionsassociated with advanced paternal age, such as Noonan syndrome, skeletal dysplasias, and craniosynostoses. [facility] is NOT diagnostic, and a positive result requires confirmation by CVS or amniocentesis. A negative test result does not rule out all single gene disorders.   Plan:   Lindsey Sellers indicated that she was likely interested in undergoing diagnostic testing. 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 Due to the possible risk of isoimmunization for the fetus if the fetus is Kell positive, Lindsey Sellers recommended that the couple wait to pursue diagnostic testing until paternal testing for Kell antigen has been performed (see his Ultrasound note  for more information). The couple was amenable to this plan, so we provided them with the CPT codes for antigen  testing and tentatively scheduled an amniocentesis for Lindsey Sellers's next appointment. The couple was also potentially interested in pursuing [facility]ara, but requested more time to consider this.  I provided the couple with several resources, including patient information about Down syndrome from the [employer] and a pamphlet on Vistara. I counseled Lindsey Sellers regarding the above risks and available options. The approximate face-to-face time with the genetic counselor was 60 minutes.  In summary:  Discussed NIPS resultand options for follow-up testing  NIPS high risk for trisomy 52  Likelihood that this is a true positive result (PPV) is 82-90%  Likely interested in pursuing diagnostic testing pending husband's Kell antigen testing. Amniocentesis scheduled for patient's next ultrasound on 09/17/19  Reviewed results of ultrasound  Cystic hygroma identified, increasing likelihood that fetus has Down syndrome  Offered additional testing and screening  Offered [facility] single gene NIPS for conditions associated with advanced paternal age. Couple will contact me if interested in pursuing this  Reviewed family history concerns   Lindsey Manis, MS, Counselling psychologist

## 2019-08-31 ENCOUNTER — Other Ambulatory Visit (HOSPITAL_COMMUNITY): Payer: No Typology Code available for payment source

## 2019-08-31 ENCOUNTER — Encounter (HOSPITAL_COMMUNITY): Payer: No Typology Code available for payment source

## 2019-08-31 DIAGNOSIS — O361990 Maternal care for other isoimmunization, unspecified trimester, not applicable or unspecified: Secondary | ICD-10-CM | POA: Insufficient documentation

## 2019-08-31 DIAGNOSIS — Z1371 Encounter for nonprocreative screening for genetic disease carrier status: Secondary | ICD-10-CM | POA: Insufficient documentation

## 2019-08-31 DIAGNOSIS — D181 Lymphangioma, any site: Secondary | ICD-10-CM | POA: Insufficient documentation

## 2019-08-31 NOTE — Progress Notes (Signed)
I connected with Lindsey Sellers on 09/01/19 at  9:35 AM EDT by: MyChart and verified that I am speaking with the correct person using two identifiers.  Patient is located at home and provider is located at [facility].     The purpose of this virtual visit is to provide medical care while limiting exposure to the novel coronavirus. I discussed the limitations, risks, security and privacy concerns of performing an evaluation and management service by MyChart and the availability of in person appointments. I also discussed with the patient that there may be a patient responsible charge related to this service. By engaging in this virtual visit, you consent to the provision of healthcare.  Additionally, you authorize for your insurance to be billed for the services provided during this visit.  The patient expressed understanding and agreed to proceed.  The following staff members participated in the virtual visit:  Vernice Jefferson, NP    PRENATAL VISIT NOTE  Subjective:  Lindsey Sellers is a 34 y.o. G2P1001 at [redacted]w[redacted]d for phone visit for ongoing prenatal care.  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  Patient reports no complaints.  Contractions: Not present. Vag. Bleeding: None.  Movement: Absent. Denies leaking of fluid.   The following portions of the patient's history were reviewed and updated as appropriate: allergies, current medications, past family history, past medical history, past social history, past surgical history and problem list.   Objective:  There were no vitals filed  for this visit. Patient has not taken BP yet this week, but will enter into [employer] today.  Fetal Status:     Movement: Absent     Assessment and Plan:  Pregnancy: G2P1001 at 167w1d1. Supervision of other normal pregnancy, antepartum -pt reports she is connected with mental health counseling d/t recent diagnoses this pregnancy  2. Multigravida of advanced maternal age in first trimester -age 34  3Kell isoimmunization during pregnancy in second trimester, single or unspecified fetus -met with MFM 08/27/2019, scheduled for amniocentesis on 09/17/2019 -husband has lab appt this afternoon to be tested for antibodies  4. Testing for genetic disease carrier status -9/10 high risk for Down Syndrome -had genetic counseling on 08/27/2019  5. History of postpartum hypertension   Preterm labor symptoms and general obstetric precautions including but not limited to vaginal bleeding, contractions, leaking of fluid and fetal movement were reviewed in detail with the patient.  Return in about 4 weeks (around 09/29/2019) for virtual ROB MD ONLY.  Future Appointments  Date Time Provider [place]5/27/2021 11:00 AM [facility]27/2021 11:00 AM [facility] US1 [facility]6/10/2019  4:00 PM LeGuss BundeMD [facility]     Time spent on virtual visit: 24 minutes  NiClarisa FlingNP

## 2019-09-01 ENCOUNTER — Telehealth (INDEPENDENT_AMBULATORY_CARE_PROVIDER_SITE_OTHER): Payer: No Typology Code available for payment source | Admitting: Women's Health

## 2019-09-01 ENCOUNTER — Encounter: Payer: Self-pay | Admitting: Women's Health

## 2019-09-01 ENCOUNTER — Telehealth: Payer: Self-pay | Admitting: *Deleted

## 2019-09-01 DIAGNOSIS — O09292 Supervision of pregnancy with other poor reproductive or obstetric history, second trimester: Secondary | ICD-10-CM | POA: Diagnosis not present

## 2019-09-01 DIAGNOSIS — O361920 Maternal care for other isoimmunization, second trimester, not applicable or unspecified: Secondary | ICD-10-CM

## 2019-09-01 DIAGNOSIS — Z3A14 14 weeks gestation of pregnancy: Secondary | ICD-10-CM

## 2019-09-01 DIAGNOSIS — Z1371 Encounter for nonprocreative screening for genetic disease carrier status: Secondary | ICD-10-CM

## 2019-09-01 DIAGNOSIS — O09522 Supervision of elderly multigravida, second trimester: Secondary | ICD-10-CM | POA: Diagnosis not present

## 2019-09-01 DIAGNOSIS — Z3480 Encounter for supervision of other normal pregnancy, unspecified trimester: Secondary | ICD-10-CM

## 2019-09-01 DIAGNOSIS — O09521 Supervision of elderly multigravida, first trimester: Secondary | ICD-10-CM

## 2019-09-01 DIAGNOSIS — D181 Lymphangioma, any site: Secondary | ICD-10-CM

## 2019-09-01 DIAGNOSIS — O09299 Supervision of pregnancy with other poor reproductive or obstetric history, unspecified trimester: Secondary | ICD-10-CM

## 2019-09-01 DIAGNOSIS — Z8679 Personal history of other diseases of the circulatory system: Secondary | ICD-10-CM

## 2019-09-01 DIAGNOSIS — Z8759 Personal history of other complications of pregnancy, childbirth and the puerperium: Secondary | ICD-10-CM

## 2019-09-01 NOTE — Progress Notes (Signed)
Pt will take BP and weight today and send it through MyChart

## 2019-09-01 NOTE — Patient Instructions (Addendum)
Maternity Assessment Unit (MAU)  The Maternity Assessment Unit (MAU) is located at the [facility] and [place] [facility]. The address is: [address], [place], [place], [place] [address]. Please see map below for additional directions.    The Maternity Assessment Unit is designed to help you during your pregnancy, and for up to 6 weeks after delivery, with any pregnancy- or postpartum-related emergencies, if you think you are in labor, or if your water has broken. For example, if you experience nausea and vomiting, vaginal bleeding, severe abdominal or pelvic pain, elevated blood pressure or other problems related to your pregnancy or postpartum time, please come to the Maternity Assessment Unit for assistance.      Second Trimester of Pregnancy The second trimester is from week 14 through week 27 (months 4 through 6). The second trimester is often a time when you feel your best. Your body has adjusted to being pregnant, and you begin to feel better physically. Usually, morning sickness has lessened or quit completely, you may have more energy, and you may have an increase in appetite. The second trimester is also a time when the fetus is growing rapidly. At the end of the sixth month, the fetus is about 9 inches long and weighs about 1 pounds. You will likely begin to feel the baby move (quickening) between 16 and 20 weeks of pregnancy. Body changes during your second trimester Your body continues to go through many changes during your second trimester. The changes vary from woman to woman.  Your weight will continue to increase. You will notice your lower abdomen bulging out.  You may begin to get stretch marks on your hips, abdomen, and breasts.  You may develop headaches that can be relieved by medicines. The medicines should be approved by your health care provider.  You may urinate more often because the fetus is pressing on your bladder.  You may develop  or continue to have heartburn as a result of your pregnancy.  You may develop constipation because certain hormones are causing the muscles that push waste through your intestines to slow down.  You may develop hemorrhoids or swollen, bulging veins (varicose veins).  You may have back pain. This is caused by: ? Weight gain. ? Pregnancy hormones that are relaxing the joints in your pelvis. ? A shift in weight and the muscles that support your balance.  Your breasts will continue to grow and they will continue to become tender.  Your gums may bleed and may be sensitive to brushing and flossing.  Dark spots or blotches (chloasma, mask of pregnancy) may develop on your face. This will likely fade after the baby is born.  A dark line from your belly button to the pubic area (linea nigra) may appear. This will likely fade after the baby is born.  You may have changes in your hair. These can include thickening of your hair, rapid growth, and changes in texture. Some women also have hair loss during or after pregnancy, or hair that feels dry or thin. Your hair will most likely return to normal after your baby is born. What to expect at prenatal visits During a routine prenatal visit:  You will be weighed to make sure you and the fetus are growing normally.  Your blood pressure will be taken.  Your abdomen will be measured to track your baby's growth.  The fetal heartbeat will be listened to.  Any test results from the previous visit will be  discussed. Your health care provider may ask you:  How you are feeling.  If you are feeling the baby move.  If you have had any abnormal symptoms, such as leaking fluid, bleeding, severe headaches, or abdominal cramping.  If you are using any tobacco products, including cigarettes, chewing tobacco, and electronic cigarettes.  If you have any questions. Other tests that may be performed during your second trimester include:  Blood tests that  check for: ? Low iron levels (anemia). ? High blood sugar that affects pregnant women (gestational diabetes) between 85 and 28 weeks. ? Rh antibodies. This is to check for a protein on red blood cells (Rh factor).  Urine tests to check for infections, diabetes, or protein in the urine.  An ultrasound to confirm the proper growth and development of the baby.  An amniocentesis to check for possible genetic problems.  Fetal screens for spina bifida and Down syndrome.  HIV (human immunodeficiency virus) testing. Routine prenatal testing includes screening for HIV, unless you choose not to have this test. Follow these instructions at home: Medicines  Follow your health care provider's instructions regarding medicine use. Specific medicines may be either safe or unsafe to take during pregnancy.  Take a prenatal vitamin that contains at least 600 micrograms (mcg) of folic acid.  If you develop constipation, try taking a stool softener if your health care provider approves. Eating and drinking   Eat a balanced diet that includes fresh fruits and vegetables, whole grains, good sources of protein such as meat, eggs, or tofu, and low-fat dairy. Your health care provider will help you determine the amount of weight gain that is right for you.  Avoid raw meat and uncooked cheese. These carry germs that can cause birth defects in the baby.  If you have low calcium intake from food, talk to your health care provider about whether you should take a daily calcium supplement.  Limit foods that are high in fat and processed sugars, such as fried and sweet foods.  To prevent constipation: ? Drink enough fluid to keep your urine clear or pale yellow. ? Eat foods that are high in fiber, such as fresh fruits and vegetables, whole grains, and beans. Activity  Exercise only as directed by your health care provider. Most women can continue their usual exercise routine during pregnancy. Try to exercise  for 30 minutes at least 5 days a week. Stop exercising if you experience uterine contractions.  Avoid heavy lifting, wear low heel shoes, and practice good posture.  A sexual relationship may be continued unless your health care provider directs you otherwise. Relieving pain and discomfort  Wear a good support bra to prevent discomfort from breast tenderness.  Take warm sitz baths to soothe any pain or discomfort caused by hemorrhoids. Use hemorrhoid cream if your health care provider approves.  Rest with your legs elevated if you have leg cramps or low back pain.  If you develop varicose veins, wear support hose. Elevate your feet for 15 minutes, 3-4 times a day. Limit salt in your diet. Prenatal Care  Write down your questions. Take them to your prenatal visits.  Keep all your prenatal visits as told by your health care provider. This is important. Safety  Wear your seat belt at all times when driving.  Make a list of emergency phone numbers, including numbers for family, friends, the hospital, and police and fire departments. General instructions  Ask your health care provider for a referral to a local prenatal  education class. Begin classes no later than the beginning of month 6 of your pregnancy.  Ask for help if you have counseling or nutritional needs during pregnancy. Your health care provider can offer advice or refer you to specialists for help with various needs.  Do not use hot tubs, steam rooms, or saunas.  Do not douche or use tampons or scented sanitary pads.  Do not cross your legs for long periods of time.  Avoid cat litter boxes and soil used by cats. These carry germs that can cause birth defects in the baby and possibly loss of the fetus by miscarriage or stillbirth.  Avoid all smoking, herbs, alcohol, and unprescribed drugs. Chemicals in these products can affect the formation and growth of the baby.  Do not use any products that contain nicotine or  tobacco, such as cigarettes and e-cigarettes. If you need help quitting, ask your health care provider.  Visit your dentist if you have not gone yet during your pregnancy. Use a soft toothbrush to brush your teeth and be gentle when you floss. Contact a health care provider if:  You have dizziness.  You have mild pelvic cramps, pelvic pressure, or nagging pain in the abdominal area.  You have persistent nausea, vomiting, or diarrhea.  You have a bad smelling vaginal discharge.  You have pain when you urinate. Get help right away if:  You have a fever.  You are leaking fluid from your vagina.  You have spotting or bleeding from your vagina.  You have severe abdominal cramping or pain.  You have rapid weight gain or weight loss.  You have shortness of breath with chest pain.  You notice sudden or extreme swelling of your face, hands, ankles, feet, or legs.  You have not felt your baby move in over an hour.  You have severe headaches that do not go away when you take medicine.  You have vision changes. Summary  The second trimester is from week 14 through week 27 (months 4 through 6). It is also a time when the fetus is growing rapidly.  Your body goes through many changes during pregnancy. The changes vary from woman to woman.  Avoid all smoking, herbs, alcohol, and unprescribed drugs. These chemicals affect the formation and growth your baby.  Do not use any tobacco products, such as cigarettes, chewing tobacco, and e-cigarettes. If you need help quitting, ask your health care provider.  Contact your health care provider if you have any questions. Keep all prenatal visits as told by your health care provider. This is important. This information is not intended to replace advice given to you by your health care provider. Make sure you discuss any questions you have with your health care provider. Document Revised: 08/01/2018 Document Reviewed: 05/15/2016 Elsevier Patient  Education  [place].        Preterm Labor and Birth Information  The normal length of a pregnancy is 39-41 weeks. Preterm labor is when labor starts before 37 completed weeks of pregnancy. What are the risk factors for preterm labor? Preterm labor is more likely to occur in women who:  Have certain infections during pregnancy such as a bladder infection, sexually transmitted infection, or infection inside the uterus (chorioamnionitis).  Have a shorter-than-normal cervix.  Have gone into preterm labor before.  Have had surgery on their cervix.  Are younger than age 66 or older than age 69.  Are African American.  Are pregnant with twins or multiple babies (multiple gestation).  Take street drugs or smoke while pregnant.  Do not gain enough weight while pregnant.  Became pregnant shortly after having been pregnant. What are the symptoms of preterm labor? Symptoms of preterm labor include:  Cramps similar to those that can happen during a menstrual period. The cramps may happen with diarrhea.  Pain in the abdomen or lower back.  Regular uterine contractions that may feel like tightening of the abdomen.  A feeling of increased pressure in the pelvis.  Increased watery or bloody mucus discharge from the vagina.  Water breaking (ruptured amniotic sac). Why is it important to recognize signs of preterm labor? It is important to recognize signs of preterm labor because babies who are born prematurely may not be fully developed. This can put them at an increased risk for:  Long-term (chronic) heart and lung problems.  Difficulty immediately after birth with regulating body systems, including blood sugar, body temperature, heart rate, and breathing rate.  Bleeding in the brain.  Cerebral palsy.  Learning difficulties.  Death. These risks are highest for babies who are born before 29 weeks of pregnancy. How is preterm labor treated? Treatment depends on  the length of your pregnancy, your condition, and the health of your baby. It may involve:  Having a stitch (suture) placed in your cervix to prevent your cervix from opening too early (cerclage).  Taking or being given medicines, such as: ? Hormone medicines. These may be given early in pregnancy to help support the pregnancy. ? Medicine to stop contractions. ? Medicines to help mature the baby's lungs. These may be prescribed if the risk of delivery is high. ? Medicines to prevent your baby from developing cerebral palsy. If the labor happens before 34 weeks of pregnancy, you may need to stay in the hospital. What should I do if I think I am in preterm labor? If you think that you are going into preterm labor, call your health care provider right away. How can I prevent preterm labor in future pregnancies? To increase your chance of having a full-term pregnancy:  Do not use any tobacco products, such as cigarettes, chewing tobacco, and e-cigarettes. If you need help quitting, ask your health care provider.  Do not use street drugs or medicines that have not been prescribed to you during your pregnancy.  Talk with your health care provider before taking any herbal supplements, even if you have been taking them regularly.  Make sure you gain a healthy amount of weight during your pregnancy.  Watch for infection. If you think that you might have an infection, get it checked right away.  Make sure to tell your health care provider if you have gone into preterm labor before. This information is not intended to replace advice given to you by your health care provider. Make sure you discuss any questions you have with your health care provider. Document Revised: 08/01/2018 Document Reviewed: 08/31/2015 Elsevier Patient Education  2020 [place] A miscarriage is the loss of an unborn baby (fetus) before the 20th week of pregnancy. Most miscarriages happen  during the first 3 months of pregnancy. Sometimes, a miscarriage can happen before a woman knows that she is pregnant. Having a miscarriage can be an emotional experience. If you have had a miscarriage, talk with your health care provider about any questions you may have about miscarrying, the grieving process, and your plans for future pregnancy. What are the causes? A miscarriage may  be caused by:  Problems with the genes or chromosomes of the fetus. These problems make it impossible for the baby to develop normally. They are often the result of random errors that occur early in the development of the baby, and are not passed from parent to child (not inherited).  Infection of the cervix or uterus.  Conditions that affect hormone balance in the body.  Problems with the cervix, such as the cervix opening and thinning before pregnancy is at term (cervical insufficiency).  Problems with the uterus. These may include: ? A uterus with an abnormal shape. ? Fibroids in the uterus. ? Congenital abnormalities. These are problems that were present at birth.  Certain medical conditions.  Smoking, drinking alcohol, or using drugs.  Injury (trauma). In many cases, the cause of a miscarriage is not known. What are the signs or symptoms? Symptoms of this condition include:  Vaginal bleeding or spotting, with or without cramps or pain.  Pain or cramping in the abdomen or lower back.  Passing fluid, tissue, or blood clots from the vagina. How is this diagnosed? This condition may be diagnosed based on:  A physical exam.  Ultrasound.  Blood tests.  Urine tests. How is this treated? Treatment for a miscarriage is sometimes not necessary if you naturally pass all the tissue that was in your uterus. If necessary, this condition may be treated with:  Dilation and curettage (D&C). This is a procedure in which the cervix is stretched open and the lining of the uterus (endometrium) is scraped.  This is done only if tissue from the fetus or placenta remains in the body (incomplete miscarriage).  Medicines, such as: ? Antibiotic medicine, to treat infection. ? Medicine to help the body pass any remaining tissue. ? Medicine to reduce (contract) the size of the uterus. These medicines may be given if you have a lot of bleeding. If you have Rh negative blood and your baby was Rh positive, you will need a shot of a medicine called Rh immunoglobulinto protect your future babies from Rh blood problems. "Rh-negative" and "Rh-positive" refer to whether or not the blood has a specific protein found on the surface of red blood cells (Rh factor). Follow these instructions at home: Medicines   Take over-the-counter and prescription medicines only as told by your health care provider.  If you were prescribed antibiotic medicine, take it as told by your health care provider. Do not stop taking the antibiotic even if you start to feel better.  Do not take NSAIDs, such as aspirin and ibuprofen, unless they are approved by your health care provider. These medicines can cause bleeding. Activity  Rest as directed. Ask your health care provider what activities are safe for you.  Have someone help with home and family responsibilities during this time. General instructions  Keep track of the number of sanitary pads you use each day and how soaked (saturated) they are. Write down this information.  Monitor the amount of tissue or blood clots that you pass from your vagina. Save any large amounts of tissue for your health care provider to examine.  Do not use tampons, douche, or have sex until your health care provider approves.  To help you and your partner with the process of grieving, talk with your health care provider or seek counseling.  When you are ready, meet with your health care provider to discuss any important steps you should take for your health. Also, discuss steps you should take to  have a healthy pregnancy in the future.  Keep all follow-up visits as told by your health care provider. This is important. Where to find more information  The American Congress of Obstetricians and Gynecologists: www.acog.org  U.S. Department of Health and [employer], systems of Women's Health: [place] Contact a health care provider if:  You have a fever or chills.  You have a foul smelling vaginal discharge.  You have more bleeding instead of less. Get help right away if:  You have severe cramps or pain in your back or abdomen.  You pass blood clots or tissue from your vagina that is walnut-sized or larger.  You soak more than 1 regular sanitary pad in an hour.  You become light-headed or weak.  You pass out.  You have feelings of sadness that take over your thoughts, or you have thoughts of hurting yourself. Summary  Most miscarriages happen in the first 3 months of pregnancy. Sometimes miscarriage happens before a woman even knows that she is pregnant.  Follow your health care provider's instruction for home care. Keep all follow-up appointments.  To help you and your partner with the process of grieving, talk with your health care provider or seek counseling. This information is not intended to replace advice given to you by your health care provider. Make sure you discuss any questions you have with your health care provider. Document Revised: 08/01/2018 Document Reviewed: 05/15/2016 Elsevier Patient Education  [place].

## 2019-09-01 NOTE — Telephone Encounter (Signed)
Left patient a message that next appointment has been scheduled as a MYCHART on 09/28/2019 at 4:00 PM.

## 2019-09-17 ENCOUNTER — Encounter: Payer: Self-pay | Admitting: *Deleted

## 2019-09-17 ENCOUNTER — Emergency Department
Admission: EM | Admit: 2019-09-17 | Discharge: 2019-09-17 | Disposition: A | Discharge status: Home / Self Care | Payer: No Typology Code available for payment source | Source: Home / Self Care | Attending: Family Medicine | Admitting: Family Medicine

## 2019-09-17 ENCOUNTER — Ambulatory Visit: Payer: No Typology Code available for payment source

## 2019-09-17 ENCOUNTER — Encounter: Payer: Self-pay | Admitting: Physician Assistant

## 2019-09-17 ENCOUNTER — Ambulatory Visit: Payer: Self-pay | Admitting: Genetic Counselor

## 2019-09-17 ENCOUNTER — Other Ambulatory Visit: Payer: Self-pay

## 2019-09-17 ENCOUNTER — Ambulatory Visit: Payer: No Typology Code available for payment source | Admitting: *Deleted

## 2019-09-17 ENCOUNTER — Encounter: Payer: Self-pay | Admitting: Emergency Medicine

## 2019-09-17 ENCOUNTER — Ambulatory Visit: Payer: No Typology Code available for payment source | Attending: Obstetrics and Gynecology

## 2019-09-17 DIAGNOSIS — Z36 Encounter for antenatal screening for chromosomal anomalies: Secondary | ICD-10-CM

## 2019-09-17 DIAGNOSIS — O3510X0 Maternal care for (suspected) chromosomal abnormality in fetus, unspecified, not applicable or unspecified: Secondary | ICD-10-CM

## 2019-09-17 DIAGNOSIS — O09299 Supervision of pregnancy with other poor reproductive or obstetric history, unspecified trimester: Secondary | ICD-10-CM

## 2019-09-17 DIAGNOSIS — D181 Lymphangioma, any site: Secondary | ICD-10-CM

## 2019-09-17 DIAGNOSIS — J029 Acute pharyngitis, unspecified: Secondary | ICD-10-CM | POA: Diagnosis not present

## 2019-09-17 DIAGNOSIS — O351XX0 Maternal care for (suspected) chromosomal abnormality in fetus, not applicable or unspecified: Secondary | ICD-10-CM

## 2019-09-17 DIAGNOSIS — O289 Unspecified abnormal findings on antenatal screening of mother: Secondary | ICD-10-CM | POA: Insufficient documentation

## 2019-09-17 DIAGNOSIS — Z8679 Personal history of other diseases of the circulatory system: Secondary | ICD-10-CM

## 2019-09-17 DIAGNOSIS — O283 Abnormal ultrasonic finding on antenatal screening of mother: Secondary | ICD-10-CM

## 2019-09-17 DIAGNOSIS — Z1371 Encounter for nonprocreative screening for genetic disease carrier status: Secondary | ICD-10-CM

## 2019-09-17 DIAGNOSIS — Z3480 Encounter for supervision of other normal pregnancy, unspecified trimester: Secondary | ICD-10-CM

## 2019-09-17 DIAGNOSIS — O285 Abnormal chromosomal and genetic finding on antenatal screening of mother: Secondary | ICD-10-CM

## 2019-09-17 DIAGNOSIS — Z3A16 16 weeks gestation of pregnancy: Secondary | ICD-10-CM | POA: Diagnosis not present

## 2019-09-17 DIAGNOSIS — O09522 Supervision of elderly multigravida, second trimester: Secondary | ICD-10-CM | POA: Diagnosis not present

## 2019-09-17 DIAGNOSIS — Z8759 Personal history of other complications of pregnancy, childbirth and the puerperium: Secondary | ICD-10-CM

## 2019-09-17 DIAGNOSIS — J069 Acute upper respiratory infection, unspecified: Secondary | ICD-10-CM

## 2019-09-17 LAB — POCT RAPID STREP A (OFFICE): Rapid Strep A Screen: NEGATIVE

## 2019-09-17 NOTE — ED Provider Notes (Signed)
[facility]    CSN: [id number] Arrival date & time: 09/17/19  1419      History   Chief Complaint No chief complaint on file.   HPI Lindsey Sellers is a 34 y.o. female.   Patient developed a very mild sore throat two days ago with fatigue and minimal nasal congestion.  She denies fever and cough.  She is [redacted] weeks gestation.  Her 21 month old developed a mild URI one week ago, becoming worse yesterday when he was diagnosed with strep pharyngitis.  The history is provided by the patient.    Past Medical History:  Diagnosis Date  . Abnormal pap 04/2006  . 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 Testing for genetic disease carrier status 08/31/2019  . 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 AMA (advanced maternal age) multigravida 35+ 08/04/2019  . History of maternal third degree perineal laceration, currently pregnant 08/04/2019  . Hx of postpartum hemorrhage, currently pregnant 08/04/2019  . History of postpartum hypertension 08/04/2019  . Supervision of other normal pregnancy, antepartum 08/03/2019    Past Surgical History:  Procedure Laterality Date  . child birth  03/05/2018  . COLPOSCOPY  2008   no biopsy done  . WISDOM TOOTH EXTRACTION      OB History    Gravida  2   Para  1   Term  1   Preterm  0   AB  0   Living  1     SAB  0   TAB  0   Ectopic  0   Multiple  0   Live Births  1            Home Medications    Prior to Admission medications   Medication Sig Start Date End Date Taking? Authorizing Provider  aspirin EC 81 MG tablet Take 1 tablet (81 mg total) by mouth daily. Take after 12 weeks for prevention of preeclampsia later in pregnancy 08/16/19   Lajean Manes, CNM  Prenatal Vit-Fe Fumarate-FA (PRENATAL VITAMIN PO) Take by mouth.    [provider]    Family  History Family History  Problem Relation Age of Onset  . Cancer Paternal Grandmother        lymph node  . Gout Father   . Colon cancer Neg Hx   . Breast cancer Neg Hx     Social History Social History   Tobacco Use  . Smoking status: Never Smoker  . Smokeless tobacco: Never Used  Substance Use Topics  . Alcohol use: Not Currently    Alcohol/week: 5.0 standard drinks    Types: 2 Glasses of wine, 3 Standard drinks or equivalent per week  . Drug use: No     Allergies   Patient has no known allergies.   Review of Systems Review of Systems + sore throat ? cough No pleuritic pain No wheezing + nasal congestion + post-nasal drainage No sinus pain/pressure No itchy/red eyes No earache No hemoptysis No SOB No fever/chills No nausea No vomiting No abdominal pain No diarrhea No urinary symptoms No skin rash + fatigue No myalgias No headache    Physical Exam Triage Vital Signs ED Triage Vitals  Enc Vitals Group     BP      Pulse      Resp  Temp      Temp src      SpO2      Weight      Height      Head Circumference      Peak Flow      Pain Score      Pain Loc      Pain Edu?      Excl. in [place]?    No data found.  Updated Vital Signs LMP 05/24/2019   Visual Acuity Right Eye Distance:   Left Eye Distance:   Bilateral Distance:    Right Eye Near:   Left Eye Near:    Bilateral Near:     Physical Exam Nursing notes and Vital Signs reviewed. Appearance:  Patient appears stated age, and in no acute distress Eyes:  Pupils are equal, round, and reactive to light and accomodation.  Extraocular movement is intact.  Conjunctivae are not inflamed  Ears:  Canals normal.  Tympanic membranes normal.  Nose:  Mildly congested turbinates.  No sinus tenderness.   Pharynx:  Normal Neck:  Supple.  Mildly enlarged lateral nodes are present, tender to palpation on the left.   Lungs:  Clear to auscultation.  Breath sounds are equal.  Moving air well. Heart:   Regular rate and rhythm without murmurs, rubs, or gallops.  Abdomen:  Nontender without masses or hepatosplenomegaly.  Bowel sounds are present.  No CVA or flank tenderness.  Extremities:  No edema.  Skin:  No rash present.   UC Treatments / Results  Labs (all labs ordered are listed, but only abnormal results are displayed) Labs Reviewed  STREP A DNA PROBE  POCT RAPID STREP A (OFFICE) negative    EKG   Radiology No results found.  Procedures Procedures (including critical care time)  Medications Ordered in UC Medications - No data to display  Initial Impression / Assessment and Plan / UC Course  I have reviewed the triage vital signs and the nursing notes.  Pertinent labs & imaging results that were available during my care of the patient were reviewed by me and considered in my medical decision making (see chart for details).    Benign exam.  There is no evidence of bacterial infection today.  Strep A DNA probe pending.  Treat symptomatically for now    Final Clinical Impressions(s) / UC Diagnoses   Final diagnoses:  Sore throat  Viral URI     Discharge Instructions     May take plain guaifenesin (1200mg  extended release tabs such as Mucinex) twice daily, with plenty of water, for cough and congestion.  Get adequate rest.   Also recommend using saline nasal spray several times daily and saline nasal irrigation (AYR is a common brand).    May take Delsym Cough Suppressant at bedtime for nighttime cough.        ED Prescriptions    None        Kandra Nicolas, MD 09/17/19 [phone]

## 2019-09-17 NOTE — Progress Notes (Signed)
Lind[facility] and had her amniocentesis performed today. We attempted to receive precertification for the amniocentesis prior to Lindsey Sellers's appointment today. However, it is not clear at this time whether or not [facility] will cover the cost of genetic testing from amniocentesis. The laboratory [facility] offers a patient pay price of $450 for all testing combined. If Lindsey Sellers expected out of pocket cost is >$450 when pursuing testing through her insurance, the laboratory will contact her and give her the option of switching to the patient-pay option. Lindsey Sellers informed me that she would be willing to pay $450 out of pocket for testing if needed.  Lindsey Sellers amniocentesis sample was sent to [facility] for FISH, karyotype with reflex to chromosomal microarray if normal, and AF-AFP analysis. A sample for maternal cell contamination was also drawn. Results from [facility] take 24-48 hours to be returned. If results are not returned by tomorrow afternoon, I will call Lindsey Sellers first thing next week. Results from karyotype take 10-12 days to be returned. If chromosome analysis is normal, testing will reflex to chromosomal microarray. Results from chromosomal microarray would take an additional 7-8 days to be returned. The couple was counseled about the possibility of receiving a variant of uncertain significance (VUS) on chromosomal microarray. They confirmed that they would like to reflex to this option should karyotype be normal in order to gain the most information about the fetus as possible. I will call Lindsey Sellers once her results become available. Both Lindsey Sellers and her husband confirmed that they had no further questions at this time.  Buelah Manis, MS, [facility] Genetic Counselor

## 2019-09-17 NOTE — ED Triage Notes (Signed)
Pt arrived during downtime- triage started on paper at 1355 Pt c/o sore throat x 3 days w/congestion Pt's 15 month old son was dx w/strep yesterday Pt is [redacted] weeks pregnant, came her from [facility]

## 2019-09-17 NOTE — Discharge Instructions (Addendum)
May take plain guaifenesin (1200mg  extended release tabs such as Mucinex) twice daily, with plenty of water, for cough and congestion.  Get adequate rest.   Also recommend using saline nasal spray several times daily and saline nasal irrigation (AYR is a common brand).    May take Delsym Cough Suppressant at bedtime for nighttime cough.

## 2019-09-18 ENCOUNTER — Telehealth: Payer: Self-pay | Admitting: *Deleted

## 2019-09-18 LAB — STREP A DNA PROBE: Group A Strep Probe: NOT DETECTED

## 2019-09-22 ENCOUNTER — Telehealth: Payer: Self-pay | Admitting: Genetic Counselor

## 2019-09-22 ENCOUNTER — Other Ambulatory Visit: Payer: Self-pay | Admitting: Obstetrics and Gynecology

## 2019-09-22 NOTE — Telephone Encounter (Signed)
I called Ms. Bacigalupi to review her [place] results from amniocentesis. Ms. Rodriguez underwent amniocentesis 09/17/19 for noninvasive prenatal screening (NIPS) results that were high-risk for trisomy 21 AKA Down syndrome and fetal cystic hygroma identified on ultrasound. FISH results are positive for trisomy in the current fetus. No numerical abnormalities were identified for chromosomes X, Y, 13, or 18, reducing the likelihood of trisomy 2, trisomy 40, and sex chromosome aneuploidies in the fetus. Of note, a small subset of cells (14/100) showed normal copy number for the chromosome 21 probe, which likely represents maternal cell contamination. However, the possibility of true mosaicism for trisomy 21 cannot be ruled out based on this FISH result.   Ms. Weideman was counseled that while FISH results are not considered diagnostic, this result is consistent with results from NIPS. We discussed that fetal karyotype on the amniocentesis sample is still being completed. While it is expected that karyotype will confirm the results from [facility], there is the possibility of something else being identified on karyotype, such as a chromosomal rearrangement, which could impact recurrence risks for future pregnancies. Additionally, karyotype will be able to determine definitively whether or not there is mosaicism for trisomy 21 present.  Ms. Warley was appropriately tearful upon hearing these results. She disclosed that while she and her husband knew this was a real possibility, they have somewhat been in denial. They have not yet had in-depth discussions about what they would do if results were positive. I validated these feelings and acknowledged the fact that they have been given a lot of overwhelming information in a very short period of time. Ms. Helou inquired about what other couples do in this situation. I informed her that some couples opt to continue a pregnancy, whereas other couples choose to end a pregnancy once Down syndrome has  been definitively diagnosed. We also discussed that some couples prefer to have genetic counseling to discuss Down syndrome and their options in-depth before making a decision about pregnancy management. Ms. Hara expressed interest in having a follow-up genetic counseling consultation as soon as possible so that all of her pregnancy management options remain available to her. I will meet virtually with Ms. Forgie and her husband Thurmond Butts tomorrow morning. I am happy to provide more resources to help the couple with their decision from there. Ms. Vogelsong confirmed that she had no further questions at this time.    Buelah Manis, MS, [facility] Genetic Counselor

## 2019-09-23 ENCOUNTER — Other Ambulatory Visit: Payer: Self-pay

## 2019-09-23 ENCOUNTER — Ambulatory Visit
Payer: No Typology Code available for payment source | Attending: Obstetrics and Gynecology | Admitting: Genetic Counselor

## 2019-09-23 ENCOUNTER — Other Ambulatory Visit: Payer: Self-pay | Admitting: *Deleted

## 2019-09-23 DIAGNOSIS — O351XX0 Maternal care for (suspected) chromosomal abnormality in fetus, not applicable or unspecified: Secondary | ICD-10-CM

## 2019-09-23 DIAGNOSIS — O285 Abnormal chromosomal and genetic finding on antenatal screening of mother: Secondary | ICD-10-CM

## 2019-09-23 DIAGNOSIS — O3513X0 Maternal care for (suspected) chromosomal abnormality in fetus, trisomy 21, not applicable or unspecified: Secondary | ICD-10-CM

## 2019-09-23 NOTE — Progress Notes (Signed)
09/23/2019  Tonia Habecker [birth year] MRN: [id number] DOV: 09/23/2019   I connected withMs.Russon6/2/21at9:00 AMESTbyWebExand verified that I am speaking with the correct person using two identifiers.[facility]ed[facility] for a genetics consultation regardingfetal trisomy 21 confirmed on amniocentesis. Ms. Battista presented to her appointment with her husband, Vedika Alvarenga.    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 Rehl is a G2P454, 34 y.o. year old female. Her current pregnancy has completed [redacted]w[redacted]d (Estimated Date of Delivery: 02/28/20).  Prenatal history was previously reviewed during Ms. Foresman genetic counseling appointment on 5/27. See Genetic Counseling note for more details.  Family History  Family history was previously reviewed during Ms. Schoch genetic counseling appointment on 5/27. See Genetic Counseling note for more details.  Discussion  Ms.Russand her husband Kadija Urciuoli returned for genetic counseling to discuss hertestingresults fromamniocentesis.As we discussed over the phone on 09/22/19,FISH confirmed trisomy21in the current fetus.  We discussed the variable signs and symptoms associated with trisomy21 (Down syndrome)in detail, including possible pregnancy complications, prenatal ultrasound findings, and postnatal symptoms. Possible pregnancy complications include growth restriction, preterm delivery, and an increased rate of C-section deliveries.Possible ultrasound findings includeincreased nuchal translucency/cystic hygromas, ventriculomegaly, absent nasal bone, an absent or hypoplastic middle phalanx, clinodactyly of the fifth finger, heart defects, an echogenic intracardiac focus, gastrointestinal abnormalities such as duodenal atresia, pyelectasis, short femurs/humerii, and an increased space between the first and second  toes. On Ms. Knabb 08/27/19 ultrasound, a cystic hygroma was noted.    Postnatally, 5-10% of individuals with Down syndrome may experience seizures. Several craniofacial differences are common in individuals with Down syndrome, including a flattened facial profile, almond-shaped upward-slanting eyes, a small mouth, protruding tongue, small ears, and microcephaly. Other physical features may include a short neck, small hands and feet, fifth finger clinodactyly, and a single palmar crease. Approximately 60% of individuals with Down syndrome have vision problems, including cataracts, strabismus, and glaucoma among other vision abnormalities. Ear infections, respiratory infections, and obstructive sleep apnea are common, and 40-75% of individuals experience hearing loss. Half of all individuals with Down syndrome have heart defects, with atrioventricular septal defects most common. Affected individuals may experience anemia or transient myeloproliferative disorder. Down syndrome is also associated with a slightly increased risk of leukemia. Hypotonia is common, affecting ~80% of infants with Down syndrome. Other musculoskeletal features may include joint laxity, decreased bone mass with an increased risk of fractures, hip dislocations, and atlantoaxial instability. Gastrointestinal problems including duodenal atresia, small bowel stenosis, annular pancreas, imperforate anus, Hirschsprung disease, reflux, constipation, diarrhea, and Celiac disease affect 10-15% of individuals with Down syndrome. Individuals may also experience thyroid dysfunction and delayed puberty. Finally, we discussed that >95% of individuals with Down syndrome have short stature.  We also reviewed that individuals withDown syndromehave cognitive and developmentaldelays, though a wide spectrum of ability is noted.Although development progresses slower compared to age-matched peers, children continue to acquire new skills as they age.  Relative areas of strength in development include social skills, self-help and daily living skills, visual learning, and reading. Relative weaknesses includelanguage/communicationskills, problem-solving skills, and math.Most individuals experience mild-moderate intellectual disability. Rarely, severe intellectual disability can be noted. Other behavioral/emotional problems that are possible include anxiety, depression, ADHD, and autism.  We discussed that prenatally, it is difficult to predict exactly which symptoms a baby withDown syndromewill have. Additionally, the degree of developmental delays and intellectual disability an infant/child with Down syndrome will  experience cannot be predicted prenatally. We discussed several possible treatments, including surgical repair of malformations and treatment aimed to support specific symptoms.Early Intervention and special educationare instrumental in assisting children achieve developmental milestones.Most individuals with Down syndrome are able to complete high school and some even go on to receive a post-secondary education with developmental and educational support.  The couple was also counseled about potential prognosis associated with Down syndrome.We discussed that up to 30% of pregnancies affected by Down syndrome end in miscarriage or stillbirth from 42 weeks' gestation until term. Of liveborn children, 96% survive the first year of life and 90% survive the first 10 years of life. We reviewed that children with Down syndrome have more similarities with their peers than differences. Many participate in community sports and activities, have friends, and are active members of their families. The median lifespan of individuals with Down syndrome is in their 82s; however, >25% of individuals live beyond this age. Some adults with Down syndrome are able to care for themselves, obtain driver's licenses, get married, hold steady jobs, and live in group  homes or apartments with assistance if needed.  We also reviewed recurrence risks.Karyotype is still pending to determine which type of Down syndrome the fetus has (full trisomy 21 vs. Translocation vs. Mosaicism). If the fetus has full trisomy 36, itlikely occurred randomly due to an error in chromosomal division during the formation of reproductive cells in a process called nondisjunction. The recurrence risk for trisomy21in a future pregnancy would be increased 1.6-fold overMs.Vonstein'sage-related risk, and recurrence for a different viable trisomy in a future pregnancy would be increased1.7-fold over her age-related risk Jodelle Gross et al., 2004). This indicates that Ms.Russwould have a ~1-3% chance for recurrence of a chromosomal trisomy in a future pregnancy at age 48. If karyotype indicates that the fetus does not have full trisomy 21, I will contact the couple with revised risk estimates. Additionally, it is recommended that Ms. Ficken be referred for genetic counseling in future pregnancies to discuss screening/testing options that would be available should she be interested in pursuing them.  Finally, we reviewed parental perspectives from individuals with children affected byDown syndrome that are documented in the literature. Several positives associated with having a child with Down syndrome have been noted by parents, including that their child enriches their lives and has a positive effect on their siblings and other family members. Many parents feel that their outlook on life is more positive than it was before they had a child with Down syndrome. Additionally, parents feel immense pride in their child.Challenges noted by parents include difficulties in caring for a child with extensive special needs and financial difficulties associated with care.We discussed thatthe couple could possibly could possibly be connected with another family who has had a child with Down syndrome should  that be desired. The National Down Syndrome Society, National Down Syndrome Congress, Down Syndrome Diagnosis Network, and Down Syndrome Network of [place] all offer fantastic resources for individuals who have received a prenatal diagnosis of Down syndrome.   We reviewed Ms. Alvarado pregnancy management options in detail. Firstly, she has the option of continuing the pregnancy. If the pregnancy is continued, Ms. Aceves will likely return to MFM for serial growth ultrasounds, as fetal growth restriction can be associated with Down syndrome. It is also recommended that she undergo a fetal echocardiogram. Secondly, the couple has the option of pursuing adoption through an organization such as the [facility]. Lastly, Ms. Lope has the  option of ending the pregnancy. Ms. Deel was informed that itis an option to end apregnancy until 20-22 weeks' gestation in the state of [place] on the clinic. Termination is still an option beyond 22 weeks' gestation in other states. Termination of pregnancy may occur through induction of labor or a dilation & evacuation (D&E) procedure. We reviewed the technical aspects of the D&E and induction procedures as well as associated benefits, limitations, and risks associated with each method. We also discussed locations around [place] that perform termination of pregnancy procedures. Finally, we reviewed [place]'s 72 hour consent law for termination procedures.   The couple had not yet discussed what they would do if results were positive. They indicated that they have developed a bond with the fetus after seeing her several times on ultrasounds, but are also frightened by the medical and developmental unknowns that can be associated with a diagnosis of Down syndrome. They disclosed that they have the financial resources and job stability that would be helpful in raising a child with special needs, but that the  emotional resources were the scariest to consider. Ms. Hathway informed me that she had postpartum depression following the birth of her son and she was nervous about experiencing that again, especially if their child has any complex needs postnatally. She has already scheduled appointments with a therapist to get ahead of the problem and process the complex emotions that this diagnosis has brought upon her, which I commended her for. The couple also expressed several other concerns, such as the impact this would have on their son, schooling challenges, etc. I validated these concerns and reassured them that these are all important factors to take into consideration when making this decision.   Ms. Horky was understandably tearful during today's session. I validated that most families in their situation feel a mixture of emotions, including sadness, anger, and fear. I also acknowledged that they have been provided with a lot of overwhelming information to process in a short period of time. When I inquired about how they have made difficult decisions in the past, they disclosed that they have been fortunate enough to not have been faced with many challenging decisions, which makes this all feel new. They indicated that factors that would be helpful in their decision-making include reviewing resources, potentially connecting with other families who have received a prenatal diagnosis, and having the anatomy scan ASAP to determine if the fetus has any major abnormalities such as a congenital heart defect. Following our session, I emailed Ms. Bering a copy of the return of results Powerpoint I had created for them. I attached a comprehensive guide for new and expectant parents of babies with Down syndrome. I also included resources for families who have ended a pregnancy for medical reasons and a fact sheet on the D&E procedure per Ms. Jefm Miles request. Ms. Bossard has her anatomy ultrasound scheduled for 6/8. I encouraged her to  contact me if she has any questions or concerns prior to that time.  I counseled Ms. Wibbenmeyer regarding the above risks and available options. The approximate face-to-face time with the genetic counselor was 60 minutes.  In summary:  Discussed features associated with Down syndrome and treatment options ? Difficult to predict symptoms and severity prenatally  Discussed prognosis associated with a diagnosis of Down syndrome ? Increased risk for fetal loss/stillbirth ? 1-year and 10-year survival rates of 96% and 90% respectively ? Median lifespan in 60s  Reviewed recurrence risks for future pregnancies ? ~  1-3% risk for a viable chromosomal aneuploidy in future pregnancies (at age 48)  Reviewed pregnancy management considerations ? Continue pregnancy with frequent ultrasounds to assess fetus's growth and fetal echocardiogram   Buelah Manis, MS, [facility] Genetic Counselor

## 2019-09-28 ENCOUNTER — Telehealth: Payer: Self-pay | Admitting: Genetic Counselor

## 2019-09-28 ENCOUNTER — Encounter: Payer: Self-pay | Admitting: Obstetrics & Gynecology

## 2019-09-28 ENCOUNTER — Telehealth (INDEPENDENT_AMBULATORY_CARE_PROVIDER_SITE_OTHER): Payer: No Typology Code available for payment source | Admitting: Obstetrics & Gynecology

## 2019-09-28 VITALS — BP 123/79 | HR 88 | Wt 157.0 lb

## 2019-09-28 DIAGNOSIS — O3513X0 Maternal care for (suspected) chromosomal abnormality in fetus, trisomy 21, not applicable or unspecified: Secondary | ICD-10-CM

## 2019-09-28 DIAGNOSIS — Z3A00 Weeks of gestation of pregnancy not specified: Secondary | ICD-10-CM

## 2019-09-28 DIAGNOSIS — O361990 Maternal care for other isoimmunization, unspecified trimester, not applicable or unspecified: Secondary | ICD-10-CM

## 2019-09-28 DIAGNOSIS — O351XX0 Maternal care for (suspected) chromosomal abnormality in fetus, not applicable or unspecified: Secondary | ICD-10-CM

## 2019-09-28 DIAGNOSIS — D181 Lymphangioma, any site: Secondary | ICD-10-CM

## 2019-09-28 DIAGNOSIS — Z1371 Encounter for nonprocreative screening for genetic disease carrier status: Secondary | ICD-10-CM

## 2019-09-28 NOTE — Telephone Encounter (Signed)
I received a call from Ms. Whiteley this morning. She informed me that after much discussion this weekend, she and her husband have opted to end the pregnancy. This was an incredibly difficult decision for the couple as this pregnancy was extremely desired. She also requested that we cancel her anatomy ultrasound, as this experience would be painful for her in light of this decision.  Ms. Mull ideally would like to pursue a D&E procedure in a hospital-based setting given her history of a postpartum hemorrhage in her prior pregnancy. While D&E is no longer an option at [place], [facility] will perform D&Es up until [redacted]w[redacted]d. I reached out to Dr. Ebbie Latus who performs procedures at [facility]. Unfortunately, she is out of the office for the next two weeks as she is undergoing surgery, but Dr. Jolyn Lent at [facility] will be performing termination procedures in Dr. Ammie Ferrier absence. I contacted Dr. Nelda Severe and gave him information about Ms. Blasco. I will fax over necessary documents, including Ms. Fernholz's FISH results and her signed termination consent form. He also requested that we submit Ms. Burgert's ultrasound images. Someone from Dr. Lisabeth Pick office will contact Ms. Monte to schedule her for a consultation and the D&E procedure.  I had previously provided Ms. Townshend with support resources for individuals considering termination of pregnancy for medical reasons. I encouraged her to contact me if she has any further questions or if I could be helpful for her in any other way.  Buelah Manis, MS, [facility] Genetic Counselor

## 2019-09-29 ENCOUNTER — Ambulatory Visit: Payer: No Typology Code available for payment source

## 2019-09-29 ENCOUNTER — Other Ambulatory Visit: Payer: No Typology Code available for payment source

## 2019-09-29 NOTE — Progress Notes (Signed)
Patient ID: Lindsey Sellers, female   DOB: Apr 12, 1986, 34 y.o.   MRN: [id number]   Pt presents for OB visit.  We discussed the recent decision to  Proceed with D & E.  Pt has appts set with Dr. Lorelle Gibbs at [facility].  Pt sees counselor which helps.  Meliza is appropriately tearful.  She will let us know if there more we can help her with.

## 2019-09-30 DIAGNOSIS — O3510X0 Maternal care for (suspected) chromosomal abnormality in fetus, unspecified, not applicable or unspecified: Secondary | ICD-10-CM | POA: Insufficient documentation

## 2019-09-30 DIAGNOSIS — O351XX0 Maternal care for (suspected) chromosomal abnormality in fetus, not applicable or unspecified: Secondary | ICD-10-CM | POA: Insufficient documentation

## 2019-10-02 ENCOUNTER — Telehealth: Payer: Self-pay | Admitting: Genetic Counselor

## 2019-10-02 NOTE — Telephone Encounter (Signed)
I emailed Ms. Kovack to let her know that all of the necessary documents have been sent over to Dr. Lisabeth Pick office. She informed me that she has a consultation with him set up on 6/9, with her D&E procedure scheduled on 6/15.   I also informed Ms. Skates that I got results from her karyotype analysis back. 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 Ms. Grumbine was counseled that this final result indicates that trisomy 21 likely occurred randomly due to an error in chromosomal division during the formation of reproductive cells in a process called nondisjunction as suspected. I also informed her that testing will not reflex to chromosomal microarray given that karyotype confirmed Down syndrome in the fetus. I encouraged her to contact me if she has any questions about this result. She told me that she will keep me updated on how her procedure goes next week.   Buelah Manis, MS, [facility] Genetic Counselor

## 2019-10-07 DIAGNOSIS — R768 Other specified abnormal immunological findings in serum: Secondary | ICD-10-CM | POA: Insufficient documentation

## 2019-10-08 ENCOUNTER — Ambulatory Visit: Payer: No Typology Code available for payment source

## 2019-10-20 ENCOUNTER — Ambulatory Visit (INDEPENDENT_AMBULATORY_CARE_PROVIDER_SITE_OTHER): Payer: No Typology Code available for payment source

## 2019-10-20 ENCOUNTER — Other Ambulatory Visit: Payer: Self-pay

## 2019-10-20 VITALS — BP 129/83 | HR 86 | Resp 16 | Ht 64.0 in | Wt 152.0 lb

## 2019-10-20 DIAGNOSIS — O3513X0 Maternal care for (suspected) chromosomal abnormality in fetus, trisomy 21, not applicable or unspecified: Secondary | ICD-10-CM

## 2019-10-20 DIAGNOSIS — Z8759 Personal history of other complications of pregnancy, childbirth and the puerperium: Secondary | ICD-10-CM

## 2019-10-20 DIAGNOSIS — O351XX0 Maternal care for (suspected) chromosomal abnormality in fetus, not applicable or unspecified: Secondary | ICD-10-CM

## 2019-10-20 DIAGNOSIS — Z332 Encounter for elective termination of pregnancy: Secondary | ICD-10-CM

## 2019-10-20 DIAGNOSIS — Z9889 Other specified postprocedural states: Secondary | ICD-10-CM | POA: Diagnosis not present

## 2019-10-20 DIAGNOSIS — O361990 Maternal care for other isoimmunization, unspecified trimester, not applicable or unspecified: Secondary | ICD-10-CM

## 2019-10-20 NOTE — Progress Notes (Signed)
[place] [address]

## 2019-10-20 NOTE — Progress Notes (Signed)
History:  [name] is a 34 y.o. G2P1011 who presents to clinic today for follow up after a D&E at [facility]. She was diagnosed with Trisomy 21 and anti-Kell antibodies. She denies any pain at this time. She reports intermittent spotting with increased activity. She is appropriately tearful.  The following portions of the patient's history were reviewed and updated as appropriate: allergies, current medications, family history, past medical history, social history, past surgical history and problem list.  Review of Systems:  Review of Systems  Constitutional: Negative.  Negative for chills and fever.  Respiratory: Negative.   Cardiovascular: Negative.  Negative for chest pain.  Genitourinary: Negative.   Neurological: Negative.  Negative for dizziness and headaches.      Objective:  Physical Exam BP 129/83   Pulse 86   Resp 16   Ht 5\' 4"  (1.626 m)   Wt 152 lb (68.9 kg)   LMP 05/24/2019   Breastfeeding No   BMI 26.09 kg/m  Physical Exam Vitals and nursing note reviewed.  Constitutional:      General: She is not in acute distress.    Appearance: She is well-developed.  HENT:     Head: Normocephalic.  Eyes:     Pupils: Pupils are equal, round, and reactive to light.  Cardiovascular:     Rate and Rhythm: Normal rate and regular rhythm.  Pulmonary:     Effort: Pulmonary effort is normal. No respiratory distress.     Breath sounds: Normal breath sounds.  Abdominal:     Palpations: Abdomen is soft.     Tenderness: There is no abdominal tenderness.  Musculoskeletal:        General: Normal range of motion.     Cervical back: Normal range of motion.  Skin:    General: Skin is warm and dry.  Neurological:     Mental Status: She is alert and oriented to person, place, and time.  Psychiatric:        Behavior: Behavior normal.        Thought Content: Thought content normal.        Judgment: Judgment normal.     Assessment & Plan:  1. Termination of  pregnancy (fetus) -Physically doing well. Gradually increasing activity daily and reports feeling better each day -Patient currently going to therapist. Started in May. Reports it is helping. Offered to start PO antidepressant as temporary support during emotional healing. Patient declines at this time. Reviewed warning signs at length and encouraged patient to call if she decides she wants medication. -CNM validated feelings and provided space to talk through decision. Commended patient for making difficult decision.  -Patient declines labs at this visit. Has PCP visit scheduled in August and wants to have labs done after more time has passed. -Patient reports desire to attempt to conceive again in the future but not anytime soon. Reports successfully using condoms and NFP in the past to prevent pregnancy. Desires to use this again and declines hormonal contraception at this time. Encouraged patient to call the office if she changes her mind  2. Kell isoimmunization during pregnancy, antepartum, single or unspecified fetus -Discussed scheduling preconception counseling appointment with MFM and genetic counseling before attempting to conceive again.  3. Down syndrome of fetus in current pregnancy, single or unspecified fetus    Wende Mott, [place] 10/20/2019 11:38 AM

## 2019-12-08 ENCOUNTER — Other Ambulatory Visit: Payer: Self-pay

## 2019-12-21 ENCOUNTER — Ambulatory Visit (INDEPENDENT_AMBULATORY_CARE_PROVIDER_SITE_OTHER): Payer: No Typology Code available for payment source | Admitting: Physician Assistant

## 2019-12-21 ENCOUNTER — Other Ambulatory Visit: Payer: Self-pay

## 2019-12-21 ENCOUNTER — Encounter: Payer: Self-pay | Admitting: Physician Assistant

## 2019-12-21 VITALS — BP 112/82 | HR 69 | Temp 97.9°F | Ht 64.0 in | Wt 155.6 lb

## 2019-12-21 DIAGNOSIS — N926 Irregular menstruation, unspecified: Secondary | ICD-10-CM

## 2019-12-21 DIAGNOSIS — E663 Overweight: Secondary | ICD-10-CM | POA: Diagnosis not present

## 2019-12-21 DIAGNOSIS — Z0000 Encounter for general adult medical examination without abnormal findings: Secondary | ICD-10-CM

## 2019-12-21 NOTE — Patient Instructions (Addendum)
It was great to see you!  Please go to the lab for blood work.   Please work on getting into a routine with exercise.  Our office will call you with your results unless you have chosen to receive results via MyChart.  If your blood work is normal we will follow-up each year for physicals and as scheduled for chronic medical problems.  If anything is abnormal we will treat accordingly and get you in for a follow-up.  Take care,  [facility] Maintenance, Female Adopting a healthy lifestyle and getting preventive care are important in promoting health and wellness. Ask your health care provider about:  The right schedule for you to have regular tests and exams.  Things you can do on your own to prevent diseases and keep yourself healthy. What should I know about diet, weight, and exercise? Eat a healthy diet   Eat a diet that includes plenty of vegetables, fruits, low-fat dairy products, and lean protein.  Do not eat a lot of foods that are high in solid fats, added sugars, or sodium. Maintain a healthy weight Body mass index (BMI) is used to identify weight problems. It estimates body fat based on height and weight. Your health care provider can help determine your BMI and help you achieve or maintain a healthy weight. Get regular exercise Get regular exercise. This is one of the most important things you can do for your health. Most adults should:  Exercise for at least 150 minutes each week. The exercise should increase your heart rate and make you sweat (moderate-intensity exercise).  Do strengthening exercises at least twice a week. This is in addition to the moderate-intensity exercise.  Spend less time sitting. Even light physical activity can be beneficial. Watch cholesterol and blood lipids Have your blood tested for lipids and cholesterol at 36 years of age, then have this test every 5 years. Have your cholesterol levels checked more often if:  Your lipid or  cholesterol levels are high.  You are older than 36 years of age.  You are at high risk for heart disease. What should I know about cancer screening? Depending on your health history and family history, you may need to have cancer screening at various ages. This may include screening for:  Breast cancer.  Cervical cancer.  Colorectal cancer.  Skin cancer.  Lung cancer. What should I know about heart disease, diabetes, and high blood pressure? Blood pressure and heart disease  High blood pressure causes heart disease and increases the risk of stroke. This is more likely to develop in people who have high blood pressure readings, are of African descent, or are overweight.  Have your blood pressure checked: ? Every 3-5 years if you are 62-41 years of age. ? Every year if you are 79 years old or older. Diabetes Have regular diabetes screenings. This checks your fasting blood sugar level. Have the screening done:  Once every three years after age 29 if you are at a normal weight and have a low risk for diabetes.  More often and at a younger age if you are overweight or have a high risk for diabetes. What should I know about preventing infection? Hepatitis B If you have a higher risk for hepatitis B, you should be screened for this virus. Talk with your health care provider to find out if you are at risk for hepatitis B infection. Hepatitis C Testing is recommended for:  Everyone born from [birth year] through 1967.  Anyone with known risk factors for hepatitis C. Sexually transmitted infections (STIs)  Get screened for STIs, including gonorrhea and chlamydia, if: ? You are sexually active and are younger than 36 years of age. ? You are older than 36 years of age and your health care provider tells you that you are at risk for this type of infection. ? Your sexual activity has changed since you were last screened, and you are at increased risk for chlamydia or gonorrhea. Ask your  health care provider if you are at risk.  Ask your health care provider about whether you are at high risk for HIV. Your health care provider may recommend a prescription medicine to help prevent HIV infection. If you choose to take medicine to prevent HIV, you should first get tested for HIV. You should then be tested every 3 months for as long as you are taking the medicine. Pregnancy  If you are about to stop having your period (premenopausal) and you may become pregnant, seek counseling before you get pregnant.  Take 400 to 800 micrograms (mcg) of folic acid every day if you become pregnant.  Ask for birth control (contraception) if you want to prevent pregnancy. Osteoporosis and menopause Osteoporosis is a disease in which the bones lose minerals and strength with aging. This can result in bone fractures. If you are 68 years old or older, or if you are at risk for osteoporosis and fractures, ask your health care provider if you should:  Be screened for bone loss.  Take a calcium or vitamin D supplement to lower your risk of fractures.  Be given hormone replacement therapy (HRT) to treat symptoms of menopause. Follow these instructions at home: Lifestyle  Do not use any products that contain nicotine or tobacco, such as cigarettes, e-cigarettes, and chewing tobacco. If you need help quitting, ask your health care provider.  Do not use street drugs.  Do not share needles.  Ask your health care provider for help if you need support or information about quitting drugs. Alcohol use  Do not drink alcohol if: ? Your health care provider tells you not to drink. ? You are pregnant, may be pregnant, or are planning to become pregnant.  If you drink alcohol: ? Limit how much you use to 0-1 drink a day. ? Limit intake if you are breastfeeding.  Be aware of how much alcohol is in your drink. In the [place] one drink equals one 12 oz bottle of beer (355 mL), one 5 oz glass of wine (148  mL), or one 1 oz glass of hard liquor (44 mL). General instructions  Schedule regular health, dental, and eye exams.  Stay current with your vaccines.  Tell your health care provider if: ? You often feel depressed. ? You have ever been abused or do not feel safe at home. Summary  Adopting a healthy lifestyle and getting preventive care are important in promoting health and wellness.  Follow your health care provider's instructions about healthy diet, exercising, and getting tested or screened for diseases.  Follow your health care provider's instructions on monitoring your cholesterol and blood pressure. This information is not intended to replace advice given to you by your health care provider. Make sure you discuss any questions you have with your health care provider. Document Revised: 04/02/2018 Document Reviewed: 04/02/2018 Elsevier Patient Education  2020 [employer]. ag

## 2019-12-21 NOTE — Progress Notes (Signed)
I acted as a Education administrator for [employer], PA-C Anselmo Pickler, LPN   Subjective:    Lindsey Sellers is a 34 y.o. female and is here for a comprehensive physical exam.  HPI  Health Maintenance Due  Topic Date Due  . COVID-19 Vaccine (1) Never done  . INFLUENZA VACCINE  11/22/2019    Acute Concerns: Irregular periods -- had a D&C this summer for chromosomal abnormalities. She was approximately [redacted] weeks pregnant. She has had irregular periods since but is being followed by ob-gyn. Denies: lightheadedness, pre-syncope. Overall is doing better with time. Does have heavier periods since having her first child.  Chronic Issues: None  Health Maintenance: Immunizations -- UTD Colonoscopy -- N/A Mammogram -- N/A PAP -- UTD    Diet -- "mostly ok"; working on healthy eating with toddler; tries not to drink much juices or sodas Sleep habits -- good sleeping overall Exercise -- exercising regularly at times; pure barre at times Current Weight -- Weight: 155 lb 9.6 oz (70.6 kg)  Weight History: Wt Readings from Last 10 Encounters:  12/21/19 155 lb 9.6 oz (70.6 kg)  10/20/19 152 lb (68.9 kg)  09/28/19 157 lb (71.2 kg)  08/27/19 156 lb 6.4 oz (70.9 kg)  08/04/19 153 lb (69.4 kg)  03/13/19 154 lb (69.9 kg)  11/03/18 153 lb (69.4 kg)  05/13/18 157 lb (71.2 kg)  04/30/18 155 lb (70.3 kg)  04/08/18 157 lb (71.2 kg)   Body mass index is 26.71 kg/m. Mood -- doing well with therapy Patient's last menstrual period was 12/15/2019. Period characteristics -- irregular since D&C Birth control -- none  Depression screen [facility] 2/9 12/21/2019  Decreased Interest 0  Down, Depressed, Hopeless 1  PHQ - 2 Score 1  Altered sleeping 0  Tired, decreased energy 1  Change in appetite 0  Feeling bad or failure about yourself  1  Trouble concentrating 0  Moving slowly or fidgety/restless 0  Suicidal thoughts 0  PHQ-9 Score 3  Difficult doing work/chores Not difficult at all     Other  providers/specialists: Patient Care Team: Inda Coke, [place] as PCP - General (Physician Assistant) Regina Eck, CNM as Referring Physician (Certified Nurse Midwife)   PMHx, SurgHx, SocialHx, Medications, and Allergies were reviewed in the Visit Navigator and updated as appropriate.   Past Medical History:  Diagnosis Date  . Abnormal pap 04/2006  . 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 child birth  03/05/2018  . COLPOSCOPY  2008   no biopsy done  . WISDOM TOOTH EXTRACTION       Family History  Problem Relation Age of Onset  . Cancer Paternal Grandmother        lymph node  . Gout Father   . Colon cancer Neg Hx   . Breast cancer Neg Hx     Social History   Tobacco Use  . Smoking status: Never Smoker  . Smokeless tobacco: Never Used  Vaping Use  . Vaping Use: Never used  Substance Use Topics  . Alcohol use: Not Currently    Alcohol/week: 5.0 standard drinks    Types: 2 Glasses of wine, 3 Standard drinks or equivalent per week  . Drug use: No    Review of Systems:   Review of Systems  Constitutional: Negative for chills, fever, malaise/fatigue and weight loss.  HENT: Negative for hearing loss, sinus pain and sore  throat.   Respiratory: Negative for cough and hemoptysis.   Cardiovascular: Negative for chest pain, palpitations, leg swelling and PND.  Gastrointestinal: Negative for abdominal pain, constipation, diarrhea, heartburn, nausea and vomiting.  Genitourinary: Negative for dysuria, frequency and urgency.  Musculoskeletal: Negative for back pain, myalgias and neck pain.  Skin: Negative for itching and rash.  Neurological: Negative for dizziness, tingling, seizures and headaches.  Endo/Heme/Allergies: Negative for polydipsia.  Psychiatric/Behavioral: Negative for depression. The patient is not nervous/anxious.     Objective:   BP 112/82   Pulse 69    Temp 97.9 F (36.6 C) (Temporal)   Ht 5\' 4"  (1.626 m)   Wt 155 lb 9.6 oz (70.6 kg)   LMP 12/15/2019   SpO2 100%   BMI 26.71 kg/m   General Appearance:    Alert, cooperative, no distress, appears stated age  Head:    Normocephalic, without obvious abnormality, atraumatic  Eyes:    PERRL, conjunctiva/corneas clear, EOM's intact, fundi    benign, both eyes  Ears:    Normal TM's and external ear canals, both ears  Nose:   Nares normal, septum midline, mucosa normal, no drainage    or sinus tenderness  Throat:   Lips, mucosa, and tongue normal; teeth and gums normal  Neck:   Supple, symmetrical, trachea midline, no adenopathy;    thyroid:  no enlargement/tenderness/nodules; no carotid   bruit or JVD  Back:     Symmetric, no curvature, ROM normal, no CVA tenderness  Lungs:     Clear to auscultation bilaterally, respirations unlabored  Chest Wall:    No tenderness or deformity   Heart:    Regular rate and rhythm, S1 and S2 normal, no murmur, rub   or gallop  Breast Exam:    Deferred  Abdomen:     Soft, non-tender, bowel sounds active all four quadrants,    no masses, no organomegaly  Genitalia:    Deferred  Rectal:    Deferred  Extremities:   Extremities normal, atraumatic, no cyanosis or edema  Pulses:   2+ and symmetric all extremities  Skin:   Skin color, texture, turgor normal, no rashes or lesions  Lymph nodes:   Cervical, supraclavicular, and axillary nodes normal  Neurologic:   CNII-XII intact, normal strength, sensation and reflexes    throughout     Assessment/Plan:   Lindsey Sellers was seen today for annual exam.  Diagnoses and all orders for this visit:  Routine physical examination Today patient counseled on age appropriate routine health concerns for screening and prevention, each reviewed and up to date or declined. Immunizations reviewed and up to date or declined. Labs ordered and reviewed. Risk factors for depression reviewed and negative. Hearing function and visual  acuity are intact. ADLs screened and addressed as needed. Functional ability and level of safety reviewed and appropriate. Education, counseling and referrals performed based on assessed risks today. Patient provided with a copy of personalized plan for preventive services.  Overweight Continue to work on healthy eating. Encouraged her to get moving more.  Irregular periods Followed by ob-gyn. Will update CBC to assess for anemia. -     CBC with Differential/Platelet; Future -     Comprehensive metabolic panel; Future -     Lipid panel; Future -     Comprehensive metabolic panel -     CBC with Differential/Platelet -     Lipid panel   Well Adult Exam: Labs ordered: Yes. Patient counseling was done. See below for items  discussed. Discussed the patient's BMI. The BMI is not in the acceptable range; BMI management plan is completed Follow up as needed for acute illness.  Patient Counseling:   [x]     Nutrition: Stressed importance of moderation in sodium/caffeine intake, saturated fat and cholesterol, caloric balance, sufficient intake of fresh fruits, vegetables, fiber, calcium, iron, and 1 mg of folate supplement per day (for females capable of pregnancy).   [x]      Stressed the importance of regular exercise.    [x]     Substance Abuse: Discussed cessation/primary prevention of tobacco, alcohol, or other drug use; driving or other dangerous activities under the influence; availability of treatment for abuse.    [x]      Injury prevention: Discussed safety belts, safety helmets, smoke detector, smoking near bedding or upholstery.    [x]      Sexuality: Discussed sexually transmitted diseases, partner selection, use of condoms, avoidance of unintended pregnancy  and contraceptive alternatives.    [x]     Dental health: Discussed importance of regular tooth brushing, flossing, and dental visits.   [x]      Health maintenance and immunizations reviewed. Please refer to Health  maintenance section.   CMA or LPN served as scribe during this visit. History, Physical, and Plan performed by medical provider. The above documentation has been reviewed and is accurate and complete.  Inda Coke, PA-C [place]

## 2019-12-22 LAB — LIPID PANEL
Cholesterol: 183 mg/dL (ref <200)
HDL: 72 mg/dL (ref >=50)
LDL Cholesterol (Calc): 88 mg/dL (calc)
Non-HDL Cholesterol (Calc): 111 mg/dL (calc) (ref <130)
Total CHOL/HDL Ratio: 2.5 (calc) (ref <5.0)
Triglycerides: 136 mg/dL (ref <150)

## 2019-12-22 LAB — CBC WITH DIFFERENTIAL/PLATELET
Absolute Monocytes: 394 cells/uL (ref 200–950)
Basophils Absolute: 32 cells/uL (ref 0–200)
Basophils Relative: 0.6 %
Eosinophils Absolute: 59 cells/uL (ref 15–500)
Eosinophils Relative: 1.1 %
HCT: 44 % (ref 35.0–45.0)
Hemoglobin: 14.2 g/dL (ref 11.7–15.5)
Lymphs Abs: 1944 cells/uL (ref 850–3900)
MCH: 29.5 pg (ref 27.0–33.0)
MCHC: 32.3 g/dL (ref 32.0–36.0)
MCV: 91.3 fL (ref 80.0–100.0)
MPV: 12 fL (ref 7.5–12.5)
Monocytes Relative: 7.3 %
Neutro Abs: 2970 cells/uL (ref 1500–7800)
Neutrophils Relative %: 55 %
Platelets: 252 10*3/uL (ref 140–400)
RBC: 4.82 10*6/uL (ref 3.80–5.10)
RDW: 14.9 % (ref 11.0–15.0)
Total Lymphocyte: 36 %
WBC: 5.4 10*3/uL (ref 3.8–10.8)

## 2019-12-22 LAB — COMPREHENSIVE METABOLIC PANEL
AG Ratio: 1.7 (calc) (ref 1.0–2.5)
ALT: 29 U/L (ref 6–29)
AST: 19 U/L (ref 10–30)
Albumin: 4.9 g/dL (ref 3.6–5.1)
Alkaline phosphatase (APISO): 53 U/L (ref 31–125)
BUN: 10 mg/dL (ref 7–25)
CO2: 24 mmol/L (ref 20–32)
Calcium: 9.6 mg/dL (ref 8.6–10.2)
Chloride: 104 mmol/L (ref 98–110)
Creat: 0.83 mg/dL (ref 0.50–1.10)
Globulin: 2.9 g/dL (calc) (ref 1.9–3.7)
Glucose, Bld: 78 mg/dL (ref 65–99)
Potassium: 4.2 mmol/L (ref 3.5–5.3)
Sodium: 140 mmol/L (ref 135–146)
Total Bilirubin: 0.4 mg/dL (ref 0.2–1.2)
Total Protein: 7.8 g/dL (ref 6.1–8.1)

## 2020-01-05 ENCOUNTER — Encounter: Payer: Self-pay | Admitting: Physician Assistant

## 2020-01-05 ENCOUNTER — Telehealth (INDEPENDENT_AMBULATORY_CARE_PROVIDER_SITE_OTHER): Payer: No Typology Code available for payment source | Admitting: Physician Assistant

## 2020-01-05 ENCOUNTER — Other Ambulatory Visit: Payer: Self-pay

## 2020-01-05 VITALS — Ht 64.0 in | Wt 155.0 lb

## 2020-01-05 DIAGNOSIS — J029 Acute pharyngitis, unspecified: Secondary | ICD-10-CM | POA: Diagnosis not present

## 2020-01-05 MED ORDER — AMOXICILLIN-POT CLAVULANATE 875-125 MG PO TABS
1.0000 | ORAL_TABLET | Freq: Two times a day (BID) | ORAL | 0 refills | Status: AC
Start: 2020-01-05 — End: 2020-01-12

## 2020-01-05 MED FILL — AMOX-CLAV 875-125 MG TABLET: 875-125 | 7 days supply | Qty: 14 | Fill #0

## 2020-01-05 NOTE — Progress Notes (Signed)
Virtual Visit via Video   I connected with Lindsey Sellers on 01/05/20 at 12:30 PM EDT by a video enabled telemedicine application and verified that I am speaking with the correct person using two identifiers. Location patient: Home Location provider: [facility], Office Persons participating in the virtual visit: [place], Marcos PA-C, Anselmo Pickler, LPN   I discussed the limitations of evaluation and management by telemedicine and the availability of in person appointments. The patient expressed understanding and agreed to proceed.  I acted as a Education administrator for [employer], PA-C [employer], LPN   Subjective:   HPI:  Pt had possible exposure to COVID-19 on Sat 9/4, pt was tested got result back on Thurs Negative.  She is fully vaccinated.  Sore throat; Otalgia Pt feels like her throat is on fire x 2 weeks. Has been using Dayquil -- this helps ease the pain but does not take it away. Denies fever or chills, nasal drainage.  She also c/o bilateral ear pain R>L off and on x 2 weeks.   She feels like she is having worsening sinus pressure.  She has had multiple sick exposures with her son who is currently in daycare.  She is eating and drinking well without any concerns.  She has a history of strep throat and symptoms feel similar to today's.  ROS: See pertinent positives and negatives per HPI.  Patient Active Problem List   Diagnosis Date Noted  . 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 Fetal chromosome abnormality, antepartum, not applicable or unspecified fetus 09/30/2019    Social History   Tobacco Use  . Smoking status: Never Smoker  . Smokeless tobacco: Never Used  Substance Use Topics  . Alcohol use: Not Currently    Alcohol/week: 5.0 standard drinks    Types: 2 Glasses of wine, 3 Standard drinks or equivalent per week    Current Outpatient Medications:  .  Prenatal Vit-Fe Fumarate-FA (PRENATAL VITAMIN PO), Take by mouth.,  Disp: , Rfl:  .  amoxicillin-clavulanate (AUGMENTIN) 875-125 MG tablet, Take 1 tablet by mouth 2 (two) times daily for 7 days., Disp: 14 tablet, Rfl: 0  No Known Allergies  Objective:   VITALS: Per patient if applicable, see vitals. GENERAL: Alert, appears well and in no acute distress. HEENT: Atraumatic, conjunctiva clear, no obvious abnormalities on inspection of external nose and ears. NECK: Normal movements of the head and neck. CARDIOPULMONARY: No increased WOB. Speaking in clear sentences. I:E ratio WNL.  MS: Moves all visible extremities without noticeable abnormality. PSYCH: Pleasant and cooperative, well-groomed. Speech normal rate and rhythm. Affect is appropriate. Insight and judgement are appropriate. Attention is focused, linear, and appropriate.  NEURO: CN grossly intact. Oriented as arrived to appointment on time with no prompting. Moves both UE equally.  SKIN: No obvious lesions, wounds, erythema, or cyanosis noted on face or hands.  Assessment and Plan:   Lindsey Sellers was seen today for sore throat and otalgia.  Diagnoses and all orders for this visit:  Pharyngitis, unspecified etiology  Other orders -     amoxicillin-clavulanate (AUGMENTIN) 875-125 MG tablet; Take 1 tablet by mouth 2 (two) times daily for 7 days.   Suspect sinusitis and possible strep throat.  Will start oral Augmentin.  I also recommend that she take oral ibuprofen.  I discussed the assessment and treatment plan with the patient. The patient was provided an opportunity to ask questions and all were answered. The patient agreed with the plan and demonstrated  an understanding of the instructions.   The patient was advised to call back or seek an in-person evaluation if the symptoms worsen or if the condition fails to improve as anticipated.   CMA or LPN served as scribe during this visit. History, Physical, and Plan performed by medical provider. The above documentation has been reviewed and is accurate  and complete.  [place], [place] 01/05/2020

## 2020-01-05 NOTE — Telephone Encounter (Signed)
Please call pt and schedule virtual visit.

## 2020-01-05 NOTE — Telephone Encounter (Signed)
Patient is scheduled today at 1230 with [facility]

## 2020-04-23 NOTE — L&D Delivery Note (Addendum)
LABOR COURSE Patient presented for IOL for gHTN on 09/08. On arrival to L&D her cervical exam was 3.5/60/-3. She received cytotec x1 at 0129 and AROM at 0521. Pitocin was started at 0550.   Delivery Note Called to room and patient was complete and pushing. Head delivered ROA. No nuchal cord present. Shoulder and body delivered in usual fashion and easily. At [phone] a healthy female was delivered via Vaginal, Spontaneous vertex presentation.  Infant with spontaneous cry, placed on mother's abdomen, dried and stimulated. Cord clamped x 2 after 1-2-minute delay, and cut by FOB. Cord blood drawn. Placenta delivered spontaneously with gentle cord traction. Appears intact. Labia, perineum, vagina, and cervix inspected with second degree perineal laceration that required repair.   Given patient's history of PPH, TXA was given at delivery and Pitocin started immediately post-partum. Uterine fundus was intermittently boggy. Hemabate and oral Cytotec given with slowing of the bleed.    APGAR: 9, 9; weight pending.   Cord: 3VC with the following complications:none.    Anesthesia:  Epidural Episiotomy: None Lacerations: Second-degree perineal laceration Suture Repair: 3.0 vicryl rapide Est. Blood Loss (mL): 616  Mom to postpartum.  Baby to Couplet care / Skin to Skin.  Eppie Gibson, MD 12/29/20 9:04 AM    Midwife attestation: I was gloved and present for delivery in its entirety and I agree with the above resident's note.  Julianne Handler, CNM 11:39 AM

## 2020-05-26 ENCOUNTER — Encounter: Payer: Self-pay | Admitting: Obstetrics and Gynecology

## 2020-05-26 ENCOUNTER — Ambulatory Visit: Payer: No Typology Code available for payment source | Admitting: Obstetrics and Gynecology

## 2020-05-26 ENCOUNTER — Other Ambulatory Visit: Payer: Self-pay

## 2020-05-26 ENCOUNTER — Other Ambulatory Visit (HOSPITAL_COMMUNITY)
Admission: RE | Admit: 2020-05-26 | Discharge: 2020-05-26 | Disposition: A | Discharge status: Home / Self Care | Payer: No Typology Code available for payment source | Source: Ambulatory Visit | Attending: Obstetrics and Gynecology | Admitting: Obstetrics and Gynecology

## 2020-05-26 ENCOUNTER — Other Ambulatory Visit (HOSPITAL_COMMUNITY): Payer: Self-pay | Admitting: Obstetrics and Gynecology

## 2020-05-26 VITALS — BP 134/90 | HR 88 | Ht 64.0 in | Wt 157.0 lb

## 2020-05-26 DIAGNOSIS — B9689 Other specified bacterial agents as the cause of diseases classified elsewhere: Secondary | ICD-10-CM

## 2020-05-26 DIAGNOSIS — B379 Candidiasis, unspecified: Secondary | ICD-10-CM

## 2020-05-26 DIAGNOSIS — N760 Acute vaginitis: Secondary | ICD-10-CM | POA: Diagnosis not present

## 2020-05-26 DIAGNOSIS — N898 Other specified noninflammatory disorders of vagina: Secondary | ICD-10-CM

## 2020-05-26 MED ORDER — CLOTRIMAZOLE 1 % VA CREA: 1.0000 | TOPICAL_CREAM | Freq: Every day | VAGINAL | 2 refills | Status: DC

## 2020-05-26 NOTE — Progress Notes (Signed)
Pt has NOB appt scheduled for 3/3

## 2020-05-26 NOTE — Progress Notes (Signed)
   GYNECOLOGY OFFICE NOTE  History:  35 y.o. G3P1011 here today for vaginal irritation for a week. Reports she has had some discharge off and on but mostly vaginal area is just very irritated and she is concerned she has an infection. No dysuria or other urinary symptoms. No bleeding. Recently found out she is pregnant, has NOB on 06/23/20.    Past Medical History:  Diagnosis Date  . Abnormal pap 04/2006  . 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 child birth  03/05/2018  . COLPOSCOPY  2008   no biopsy done  . WISDOM TOOTH EXTRACTION       Current Outpatient Medications:  .  clotrimazole (GYNE-LOTRIMIN) 1 % vaginal cream, Place 1 Applicatorful vaginally at bedtime., Disp: 30 g, Rfl: 2 .  Prenatal Vit-Fe Fumarate-FA (PRENATAL VITAMIN PO), Take by mouth., Disp: , Rfl:   The following portions of the patient's history were reviewed and updated as appropriate: allergies, current medications, past family history, past medical history, past social history, past surgical history and problem list.   Review of Systems:  Pertinent items noted in HPI and remainder of comprehensive ROS otherwise negative.   Objective:  Physical Exam BP 134/90   Pulse 88   Ht 5\' 4"  (1.626 m)   Wt 157 lb (71.2 kg)   LMP 04/11/2020   BMI 26.95 kg/m  CONSTITUTIONAL: Well-developed, well-nourished female in no acute distress.  HENT:  Normocephalic, atraumatic. External right and left ear normal. Oropharynx is clear and moist EYES: Conjunctivae and EOM are normal. Pupils are equal, round, and reactive to light. No scleral icterus.  NECK: Normal range of motion, supple, no masses SKIN: Skin is warm and dry. No rash noted. Not diaphoretic. No erythema. No pallor. NEUROLOGIC: Alert and oriented to person, place, and time. Normal reflexes, muscle tone coordination. No cranial nerve deficit noted. PSYCHIATRIC: Normal  mood and affect. Normal behavior. Normal judgment and thought content. CARDIOVASCULAR: Normal heart rate noted RESPIRATORY: Effort normal, no problems with respiration noted ABDOMEN: Soft, no distention noted.   PELVIC: erythematous and mildly edematous labia majora, scant excoriations on vulva, normal appearing vaginal mucosa and cervix.  Thick white chunky discharge in vagina.  pelvic cultures obtained.  MUSCULOSKELETAL: Normal range of motion. No edema noted.  Exam done with chaperone present.  Labs and Imaging No results found.  Assessment & Plan:  1. Vaginal irritation Classic yeast appearance Rx sent to pharmacy - Cervicovaginal ancillary only( [place]) - Herpes simplex virus culture  2. Yeast infection .  Routine preventative health maintenance measures emphasized. Please refer to After Visit Summary for other counseling recommendations.   Return in about 4 weeks (around 06/23/2020) for as scheduled.  Total face-to-face time with patient: 15 minutes. Over 50% of encounter was spent on counseling and coordination of care.  Feliz Beam, MD, [place] for [employer] [facility])

## 2020-05-27 LAB — CERVICOVAGINAL ANCILLARY ONLY
Bacterial Vaginitis (gardnerella): POSITIVE — AB
Candida Glabrata: NEGATIVE
Candida Vaginitis: POSITIVE — AB
Chlamydia: NEGATIVE
Comment: NEGATIVE
Comment: NEGATIVE
Comment: NEGATIVE
Comment: NEGATIVE
Comment: NEGATIVE
Comment: NORMAL
Neisseria Gonorrhea: NEGATIVE
Trichomonas: NEGATIVE

## 2020-05-27 LAB — TIQ-NTM

## 2020-05-30 ENCOUNTER — Other Ambulatory Visit: Payer: Self-pay | Admitting: Obstetrics and Gynecology

## 2020-05-30 MED ORDER — METRONIDAZOLE 500 MG PO TABS: 500.0000 mg | ORAL_TABLET | Freq: Two times a day (BID) | ORAL | 0 refills | Status: DC

## 2020-05-30 MED FILL — METRONIDAZOLE 500 MG TABS: 500 | 7 days supply | Qty: 14 | Fill #0

## 2020-05-30 NOTE — Addendum Note (Signed)
Addended by: Vivien Rota on: 05/30/2020 08:52 AM   Modules accepted: Orders

## 2020-06-03 LAB — HERPES SIMPLEX VIRUS CULTURE
MICRO NUMBER:: 11496106
SPECIMEN QUALITY:: ADEQUATE

## 2020-06-22 ENCOUNTER — Encounter: Payer: Self-pay | Admitting: *Deleted

## 2020-06-22 NOTE — Progress Notes (Signed)
Last pap 4/2.  Bedside U/S shows single IUP with FHT of 169 BPM and CRL measures 34.86 mm  GA [redacted]w[redacted]d.  Pt will do NIPTS @ 12 weeks

## 2020-06-23 ENCOUNTER — Other Ambulatory Visit: Payer: Self-pay

## 2020-06-23 ENCOUNTER — Other Ambulatory Visit: Payer: Self-pay | Admitting: Obstetrics and Gynecology

## 2020-06-23 ENCOUNTER — Ambulatory Visit (INDEPENDENT_AMBULATORY_CARE_PROVIDER_SITE_OTHER): Payer: No Typology Code available for payment source | Admitting: Obstetrics and Gynecology

## 2020-06-23 ENCOUNTER — Other Ambulatory Visit (HOSPITAL_COMMUNITY)
Admission: RE | Admit: 2020-06-23 | Discharge: 2020-06-23 | Disposition: A | Discharge status: Home / Self Care | Payer: No Typology Code available for payment source | Source: Ambulatory Visit | Attending: Obstetrics and Gynecology | Admitting: Obstetrics and Gynecology

## 2020-06-23 ENCOUNTER — Encounter: Payer: Self-pay | Admitting: Obstetrics and Gynecology

## 2020-06-23 VITALS — BP 134/82 | HR 82 | Wt 158.0 lb

## 2020-06-23 DIAGNOSIS — Z3A10 10 weeks gestation of pregnancy: Secondary | ICD-10-CM | POA: Diagnosis not present

## 2020-06-23 DIAGNOSIS — Z8679 Personal history of other diseases of the circulatory system: Secondary | ICD-10-CM | POA: Diagnosis not present

## 2020-06-23 DIAGNOSIS — O09299 Supervision of pregnancy with other poor reproductive or obstetric history, unspecified trimester: Secondary | ICD-10-CM | POA: Insufficient documentation

## 2020-06-23 DIAGNOSIS — O361910 Maternal care for other isoimmunization, first trimester, not applicable or unspecified: Secondary | ICD-10-CM

## 2020-06-23 DIAGNOSIS — Z8759 Personal history of other complications of pregnancy, childbirth and the puerperium: Secondary | ICD-10-CM

## 2020-06-23 DIAGNOSIS — O0990 Supervision of high risk pregnancy, unspecified, unspecified trimester: Secondary | ICD-10-CM

## 2020-06-23 DIAGNOSIS — O361990 Maternal care for other isoimmunization, unspecified trimester, not applicable or unspecified: Secondary | ICD-10-CM | POA: Insufficient documentation

## 2020-06-23 NOTE — Progress Notes (Signed)
INITIAL PRENATAL VISIT NOTE  Subjective:  Lindsey Sellers is a 35 y.o. G3P1011 at [redacted]w[redacted]d by LMP being seen today for her initial prenatal visit. She has an obstetric history significant for term SVD with postpartum hemorrhage requiring blood transfusion, post partum hypertension. H/o D&E for T21 with cystic hygroma. She has a medical history significant for n/a.  Patient reports fatigue.   . Vag. Bleeding: None.   . Denies leaking of fluid.    Past Medical History:  Diagnosis Date  . Abnormal pap 04/2006  . 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 child birth  03/05/2018  . COLPOSCOPY  2008   no biopsy done  . WISDOM TOOTH EXTRACTION      OB History  Gravida Para Term Preterm AB Living  3 1 1  0 1 1  SAB IAB Ectopic Multiple Live Births  0 1 0 0 1    # Outcome Date GA Lbr Len/2nd Weight Sex Delivery Anes PTL Lv  3 Current           2 IAB 10/06/19 [redacted]w[redacted]d            Birth Comments: cystic hygroma, T21  1 Term 03/05/18 [redacted]w[redacted]d 17:27 / 02:00 8 lb 5.4 oz (3.782 kg) M Vag-Spont None  LIV     Birth Comments: WNL    Social History   Socioeconomic History  . Marital status: Married    Spouse name: Not on file  . Number of children: Not on file  . Years of education: Not on file  . Highest education level: Not on file  Occupational History  . Not on file  Tobacco Use  . Smoking status: Never Smoker  . Smokeless tobacco: Never Used  Vaping Use  . Vaping Use: Never used  Substance and Sexual Activity  . Alcohol use: Not Currently    Alcohol/week: 5.0 standard drinks    Types: 2 Glasses of wine, 3 Standard drinks or equivalent per week  . Drug use: No  . Sexual activity: Yes    Partners: Male    Birth control/protection: None  Other Topics Concern  . 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 Cancer Paternal Grandmother        lymph node  . Gout Father   . Colon cancer Neg Hx   . Breast cancer Neg Hx      Current Outpatient Medications:  .  Prenatal Vit-Fe Fumarate-FA (PRENATAL VITAMIN PO), Take by mouth., Disp: , Rfl:   No Known Allergies  Review of Systems: Negative except for what is mentioned in HPI.  Objective:   Vitals:   06/23/20 1316  BP: 134/82  Pulse: 82  Weight: 158 lb (71.7 kg)   Fetal Status:           Physical Exam: BP 134/82  Pulse 82   Wt 158 lb (71.7 kg)   LMP 04/11/2020 (Exact Date)   BMI 27.12 kg/m  CONSTITUTIONAL: Well-developed, well-nourished female in no acute distress.  NEUROLOGIC: Alert and oriented to person, place, and time. Normal reflexes, muscle tone coordination. No cranial nerve deficit noted. PSYCHIATRIC: Normal mood and affect. Normal behavior. Normal judgment and thought content. SKIN: Skin is warm and dry. No rash noted. Not diaphoretic. No erythema. No pallor. HENT:  Normocephalic, atraumatic, External right and left ear normal. Oropharynx is clear and moist EYES: Conjunctivae and EOM are normal. Pupils are equal, round, and reactive to light. No scleral icterus.  NECK: Normal range of motion, supple, no masses CARDIOVASCULAR: Normal heart rate noted, regular rhythm RESPIRATORY: Effort and breath sounds normal, no problems with respiration noted BREASTS: symmetric, non-tender, no masses palpable ABDOMEN: Soft, nontender, nondistended, gravid. GU: deferred MUSCULOSKELETAL: Normal range of motion. EXT:  No edema and no tenderness. 2+ distal  pulses.   Assessment and Plan:  Pregnancy: G3P1011 at [redacted]w[redacted]d by LMP c/w 1st trim [name]  1. H/O postpartum hemorrhage, currently pregnant Reviewed [facility] structure, multiple providers, fellows, medical students, virtual visits, MyChart.  - Obstetric panel - HIV antibody (with reflex) - Hepatitis C Antibody - Culture, OB Urine - [name] bedside; Future - Babyscripts Schedule Optimization - Urine cytology ancillary only([place])  2. 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 History of postpartum hypertension Was on meds x 1 month  5. [redacted] weeks gestation of pregnancy   Preterm labor symptoms and general obstetric precautions including but not limited to vaginal bleeding, contractions, leaking of fluid and fetal movement were reviewed in detail with the patient.  Please refer to After Visit Summary for other counseling recommendations.   Return in about 4 weeks (around 07/21/2020) for high OB.  Sloan Leiter 06/23/2020 2:04 PM

## 2020-06-27 LAB — OBSTETRIC PANEL
Absolute Monocytes: 517 cells/uL (ref 200–950)
Antibody Screen: POSITIVE — AB
Basophils Absolute: 8 cells/uL (ref 0–200)
Basophils Relative: 0.1 %
Eosinophils Absolute: 41 cells/uL (ref 15–500)
Eosinophils Relative: 0.5 %
HCT: 39.1 % (ref 35.0–45.0)
Hemoglobin: 13.3 g/dL (ref 11.7–15.5)
Hepatitis B Surface Ag: NONREACTIVE
Lymphs Abs: 1960 cells/uL (ref 850–3900)
MCH: 31.5 pg (ref 27.0–33.0)
MCHC: 34 g/dL (ref 32.0–36.0)
MCV: 92.7 fL (ref 80.0–100.0)
MPV: 11 fL (ref 7.5–12.5)
Monocytes Relative: 6.3 %
Neutro Abs: 5674 cells/uL (ref 1500–7800)
Neutrophils Relative %: 69.2 %
Platelets: 263 10*3/uL (ref 140–400)
RBC: 4.22 10*6/uL (ref 3.80–5.10)
RDW: 12.8 % (ref 11.0–15.0)
RPR Ser Ql: NONREACTIVE
Rubella: 2.34 Index
Total Lymphocyte: 23.9 %
WBC: 8.2 10*3/uL (ref 3.8–10.8)

## 2020-06-27 LAB — URINE CYTOLOGY ANCILLARY ONLY
Chlamydia: NEGATIVE
Comment: NEGATIVE
Comment: NORMAL
Neisseria Gonorrhea: NEGATIVE

## 2020-06-27 LAB — ANTIBODY ID, TITER, AND TYPING, RBC
Antigen Typing (3): NEGATIVE
Titer: 1:32 {titer}

## 2020-06-27 LAB — HEPATITIS C ANTIBODY
Hepatitis C Ab: NONREACTIVE
SIGNAL TO CUT-OFF: 0.02 (ref <1.00)

## 2020-06-27 LAB — HIV ANTIBODY (ROUTINE TESTING W REFLEX): HIV 1&2 Ab, 4th Generation: NONREACTIVE

## 2020-06-29 ENCOUNTER — Encounter: Payer: Self-pay | Admitting: *Deleted

## 2020-06-29 DIAGNOSIS — O0990 Supervision of high risk pregnancy, unspecified, unspecified trimester: Secondary | ICD-10-CM

## 2020-06-29 LAB — GYN REPORT: PAP Smear Comment: 0

## 2020-07-21 ENCOUNTER — Ambulatory Visit (INDEPENDENT_AMBULATORY_CARE_PROVIDER_SITE_OTHER): Payer: No Typology Code available for payment source | Admitting: Obstetrics and Gynecology

## 2020-07-21 ENCOUNTER — Encounter: Payer: Self-pay | Admitting: Physician Assistant

## 2020-07-21 ENCOUNTER — Other Ambulatory Visit: Payer: Self-pay

## 2020-07-21 VITALS — BP 118/81 | HR 91 | Wt 160.0 lb

## 2020-07-21 DIAGNOSIS — Z3A14 14 weeks gestation of pregnancy: Secondary | ICD-10-CM

## 2020-07-21 DIAGNOSIS — O361910 Maternal care for other isoimmunization, first trimester, not applicable or unspecified: Secondary | ICD-10-CM

## 2020-07-21 DIAGNOSIS — O361990 Maternal care for other isoimmunization, unspecified trimester, not applicable or unspecified: Secondary | ICD-10-CM

## 2020-07-21 DIAGNOSIS — O0990 Supervision of high risk pregnancy, unspecified, unspecified trimester: Secondary | ICD-10-CM

## 2020-07-21 DIAGNOSIS — Z8679 Personal history of other diseases of the circulatory system: Secondary | ICD-10-CM

## 2020-07-21 DIAGNOSIS — Z8759 Personal history of other complications of pregnancy, childbirth and the puerperium: Secondary | ICD-10-CM

## 2020-07-21 DIAGNOSIS — O09299 Supervision of pregnancy with other poor reproductive or obstetric history, unspecified trimester: Secondary | ICD-10-CM

## 2020-07-21 NOTE — Telephone Encounter (Signed)
Spoke to pt told her calling about referral to Maternal Fetal Medicine. Pt said she was just ready to message [name] that she logged into her account and her referral to Dr. Donalee Citrin is still good for 40 days. Told her okay, if she needs it before to let me know but otherwise let me know a week before it expires and a I will send new referral over. Pt verbalized understanding.

## 2020-07-21 NOTE — Progress Notes (Signed)
   PRENATAL VISIT NOTE  Subjective:  Lindsey Sellers is a 35 y.o. G3P1011 at [redacted]w[redacted]d being seen today for ongoing prenatal care.  She is currently monitored for the following issues for this high-risk pregnancy and has AMA (advanced maternal age) multigravida 35+; Red blood cell antibody positive; H/O postpartum hemorrhage, currently pregnant; Trisomy 21, 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 pregnancy, currently pregnant; Kell isoimmunization during pregnancy; History of postpartum hypertension; Supervision of high risk pregnancy, antepartum; and Maternal atypical antibody complicating pregnancy on their problem list.  Patient reports no complaints. Feeling better this week.  . Vag. Bleeding: None.   . Denies leaking of fluid.   The following portions of the patient's history were reviewed and updated as appropriate: allergies, current medications, past family history, past medical history, past social history, past surgical history and problem list.   Objective:   Vitals:   07/21/20 0853  BP: 118/81  Pulse: 91  Weight: 160 lb (72.6 kg)    Fetal Status: Fetal Heart Rate (bpm): 147         General:  Alert, oriented and cooperative. Patient is in no acute distress.  Skin: Skin is warm and dry. No rash noted.   Cardiovascular: Normal heart rate noted  Respiratory: Normal respiratory effort, no problems with respiration noted  Abdomen: Soft, gravid, appropriate for gestational age.        Pelvic: Cervical exam deferred        Extremities: Normal range of motion.  Edema: None  Mental Status: Normal mood and affect. Normal behavior. Normal judgment and thought content.   Assessment and Plan:  Pregnancy: G3P1011 at [redacted]w[redacted]d  1. [redacted] weeks gestation of pregnancy  2. Supervision of high risk pregnancy, antepartum  3. H/O postpartum hemorrhage, currently pregnant  4. Trisomy 21, 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 pregnancy, currently pregnant Panorama negative  5. 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 History of postpartum hypertension  7. Maternal atypical antibody affecting pregnancy, antepartum, single or unspecified fetus Anti little c referrral to MFM for consult  Preterm labor symptoms and general obstetric precautions including but not limited to vaginal bleeding, contractions, leaking of fluid and fetal movement were reviewed in detail with the patient. Please refer to After Visit Summary for other counseling recommendations.   Return in about 4 weeks (around 08/18/2020) for high OB, in person.  Future Appointments  Date Time Provider [place]  08/22/2020  8:45 AM [facility] NURSE [facility]  08/22/2020  9:00 AM [facility] US1 [facility]    Sloan Leiter, MD

## 2020-07-25 ENCOUNTER — Encounter: Payer: Self-pay | Admitting: *Deleted

## 2020-07-25 ENCOUNTER — Other Ambulatory Visit: Payer: Self-pay

## 2020-07-25 ENCOUNTER — Ambulatory Visit (HOSPITAL_BASED_OUTPATIENT_CLINIC_OR_DEPARTMENT_OTHER): Payer: No Typology Code available for payment source | Admitting: Obstetrics and Gynecology

## 2020-07-25 ENCOUNTER — Ambulatory Visit: Payer: No Typology Code available for payment source | Attending: Obstetrics and Gynecology | Admitting: *Deleted

## 2020-07-25 ENCOUNTER — Ambulatory Visit: Payer: No Typology Code available for payment source

## 2020-07-25 VITALS — BP 140/85 | HR 100

## 2020-07-25 DIAGNOSIS — Q999 Chromosomal abnormality, unspecified: Secondary | ICD-10-CM | POA: Diagnosis not present

## 2020-07-25 DIAGNOSIS — O361920 Maternal care for other isoimmunization, second trimester, not applicable or unspecified: Secondary | ICD-10-CM | POA: Insufficient documentation

## 2020-07-25 DIAGNOSIS — O09522 Supervision of elderly multigravida, second trimester: Secondary | ICD-10-CM

## 2020-07-25 DIAGNOSIS — Z3A12 12 weeks gestation of pregnancy: Secondary | ICD-10-CM | POA: Diagnosis not present

## 2020-07-25 NOTE — Progress Notes (Signed)
Maternal-Fetal Medicine   Name: Lindsey Sellers DOB: [id number] MRN: [id number] Referring Provider: Vivien Rota, MD  I had the pleasure of seeing Lindsey Sellers today at the [place] for [facility]. She was accompanied by her husband. She is G3 P1011 at 12-weeks' gestation and is here for consultation.  Obstetric history significant for a term vaginal delivery in 2019 of a female infant.  After delivery, she had postpartum hemorrhage and was transfused 2 units of blood.  She was readmitted 12 days later with severe vaginal bleeding and received 2 units of blood transfusion again. In her second pregnancy, fetal cystic hygroma was detected in the first trimester.  Subsequently, patient had amniocentesis in the second trimester and fetal Down syndrome was confirmed.  She had D&E procedure at [redacted] weeks gestation at [facility].  She had good postoperative recovery. She had consultation with me in the last pregnancy was fetal cystic hygroma and anti-Kell antibodies.  Subsequently, her husband screened negative for Kell antigen and she was reassured. Prenatal course: Pregnancy is dated by LMP date that is consistent with 10 weeks ultrasound performed at your office.  On routine screening, anti-c (small C) antibodies were detected with titers at 1:1. GYN history: No history of abnormal Pap smears or cervical surgeries. Past medical history: No history of diabetes or hypertension or any other chronic medical conditions. Past surgical history: Wisdom tooth surgery, D&E procedure. Medications: Prenatal vitamins. Allergies: No known drug allergies. Our concerns include: Anti-c (small c) antibodies -I explained that "c" antigen is part of rhesus system and is not a major antigen (D). I explained the genetics and the possibility of her having acquired after her second pregnancy (she is likely to be c negative) with the fetus being c positive. She did not get blood transfusion after D&E.  Hemolytic disease  of fetus and newborn (HDFN) can occur with anti-c antibodies.  -Patient did not have anti-c antibodies on screening in her previous pregnancy. She has the same partner.  -I explained surveillance by non-invasive Doppler study of middle-cerebral artery (MCA) to detect fetal anemia. I also briefly counseled on fetal blood sampling and transfusion. -Antibody titers are very low (1:1). However, hemolysis can occur with anti-c antibodies. Patient's blood type is A positive. In the absence of RhD isoimmunization, the likelihood of isoimmunization with anti-c antibodies is very low. -Partner screening: If her husband (father of the baby) is "c" antigen negative, fetal hemolysis is unlikely. Her husband is more likely to be "c" antigen positive. I discussed screening for c antigen. He is keen on screening and prefers to get the order for blood work from his PCP.  Anti-Kell antibodies Both patient and her husband are Kell antigen negative. She is aware of the possibility of having acquired these antibodies after blood transfusions following her first delivery. I reassured her that the fetus is likely to be Kell antigen negative, and therefore, not at risk of fetal hemolysis.  History of Down syndrome Her age-related risk for Down syndrome at 5 weeks' gestation is around 1 in 150 and the recurrence rate is only slightly increased (1 in 135). I discussed the significance and limitations of cell-free fetal DNA screening that has a greater detection rate for Down syndrome and has a very low false negative rate. I discussed amniocentesis for confirmation of fetal karyotype. Patient opted not to have amniocentesis.  Recommendations: -Detailed fetal anatomical survey on 08/22/20. -MCA Doppler and transvaginal ultrasound (history of D&C) with anatomy scan. -Repeat serum screening for  antibodies a week before anatomy scan. -Since antibody titers do not always predict fetal hemolysis, we will perform MCA Doppler  studies every 2 to 4 weeks. -Partner screening for "c" antigen (PCP).  Thank you for consultation. Please contact me if you have any questions.  Consultation including face-to-face counseling 45 minutes.

## 2020-07-27 ENCOUNTER — Other Ambulatory Visit: Payer: Self-pay | Admitting: Physician Assistant

## 2020-07-27 DIAGNOSIS — O0990 Supervision of high risk pregnancy, unspecified, unspecified trimester: Secondary | ICD-10-CM

## 2020-07-27 DIAGNOSIS — O09529 Supervision of elderly multigravida, unspecified trimester: Secondary | ICD-10-CM

## 2020-07-29 ENCOUNTER — Other Ambulatory Visit: Payer: Self-pay | Admitting: Certified Nurse Midwife

## 2020-07-29 DIAGNOSIS — Z3480 Encounter for supervision of other normal pregnancy, unspecified trimester: Secondary | ICD-10-CM

## 2020-08-09 ENCOUNTER — Telehealth: Payer: Self-pay | Admitting: Genetic Counselor

## 2020-08-09 NOTE — Telephone Encounter (Signed)
I called Ms. Blow to provide her with the CPT code for her husband's little c antigen testing that was recommended by Dr. Donalee Citrin. The [facility] test code is [phone] and [place]PT code [place] 86905(x2). I encouraged her to contact me if her husband's PCP has any trouble ordering this test.  Buelah Manis, MS, [facility] Genetic Counselor

## 2020-08-18 ENCOUNTER — Other Ambulatory Visit: Payer: Self-pay

## 2020-08-18 ENCOUNTER — Ambulatory Visit (INDEPENDENT_AMBULATORY_CARE_PROVIDER_SITE_OTHER): Payer: No Typology Code available for payment source | Admitting: Obstetrics & Gynecology

## 2020-08-18 VITALS — BP 136/86 | HR 75 | Wt 165.0 lb

## 2020-08-18 DIAGNOSIS — O0990 Supervision of high risk pregnancy, unspecified, unspecified trimester: Secondary | ICD-10-CM

## 2020-08-18 DIAGNOSIS — Z3A18 18 weeks gestation of pregnancy: Secondary | ICD-10-CM

## 2020-08-18 DIAGNOSIS — O361920 Maternal care for other isoimmunization, second trimester, not applicable or unspecified: Secondary | ICD-10-CM

## 2020-08-18 DIAGNOSIS — O09522 Supervision of elderly multigravida, second trimester: Secondary | ICD-10-CM

## 2020-08-18 DIAGNOSIS — R350 Frequency of micturition: Secondary | ICD-10-CM

## 2020-08-18 NOTE — Progress Notes (Signed)
   PRENATAL VISIT NOTE  Subjective:  Lindsey Sellers is a 35 y.o. G3P1011 at [redacted]w[redacted]d being seen today for ongoing prenatal care.  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  Patient reports frequent urination, no pain, no fevers. Concerned about UTI.  Contractions: Not present. Vag. Bleeding: None.  Movement: Present. Denies leaking of fluid.   The following portions of the patient's history were reviewed and updated as appropriate: allergies, current medications, past family history, past medical history, past social history, past surgical history and problem list.   Objective:   Vitals:   08/18/20 0942  BP: 136/86  Pulse: 75  Weight: 165 lb (74.8 kg)    Fetal Status: Fetal Heart Rate (bpm): 140   Movement: Present     General:  Alert, oriented and cooperative. Patient is in no acute distress.  Skin: Skin is warm and dry. No rash noted.   Cardiovascular: Normal heart rate noted  Respiratory: Normal respiratory effort, no problems with respiration noted  Abdomen: Soft, gravid, appropriate for gestational age.  Pain/Pressure: Absent     Pelvic: Cervical exam deferred        Extremities: Normal range of motion.  Edema: None  Mental Status: Normal mood and affect. Normal behavior. Normal judgment and thought content.   Assessment and Plan:  Pregnancy: G3P1011 at [redacted]w[redacted]d 1. Maternal atypical antibody (anti- little C) affecting pregnancy in second trimester, single or unspecified fetus 2. Kell isoimmunization during pregnancy in second trimester,  single or unspecified fetus MFM detailed anatomy scan soon, titer to be checked today. Will follow up results and manage accordingly. Follow up MFM recommendations. - ANTIBODY ID, TITER, AND TYPING, RBC  3. Frequent urination Culture done, will follow up results and manage accordingly. - Culture, OB Urine  4. Multigravida of advanced maternal age in second trimester 5. [redacted] weeks gestation of pregnancy 6. Supervision of high risk pregnancy, antepartum LR NIPS, AFP today.  - Alpha fetoprotein, maternal No other complaints or concerns.  Routine obstetric precautions reviewed. . Please refer to After Visit Summary for other counseling recommendations.   Return in about 4 weeks (around 09/15/2020) for OFFICE OB VISIT (MD only).  Future Appointments  Date Time Provider [place]  08/22/2020  8:45 AM [facility] NURSE [facility]  08/22/2020  9:00 AM [facility] US1 [facility]  09/12/2020 10:15 AM [place], Fredderick Phenix, MD [facility]    Verita Schneiders, MD

## 2020-08-18 NOTE — Patient Instructions (Signed)
Second Trimester of Pregnancy  The second trimester of pregnancy is from week 13 through week 27. This is months 4 through 6 of pregnancy. The second trimester is often a time when you feel your best. Your body has adjusted to being pregnant, and you begin to feel better physically. During the second trimester:  Morning sickness has lessened or stopped completely.  You may have more energy.  You may have an increase in appetite. The second trimester is also a time when the unborn baby (fetus) is growing rapidly. At the end of the sixth month, the fetus may be up to 12 inches long and weigh about 1 pounds. You will likely begin to feel the baby move (quickening) between 16 and 20 weeks of pregnancy. Body changes during your second trimester Your body continues to go through many changes during your second trimester. The changes vary and generally return to normal after the baby is born. Physical changes  Your weight will continue to increase. You will notice your lower abdomen bulging out.  You may begin to get stretch marks on your hips, abdomen, and breasts.  Your breasts will continue to grow and to become tender.  Dark spots or blotches (chloasma or mask of pregnancy) may develop on your face.  A dark line from your belly button to the pubic area (linea nigra) may appear.  You may have changes in your hair. These can include thickening of your hair, rapid growth, and changes in texture. Some people also have hair loss during or after pregnancy, or hair that feels dry or thin. Health changes  You may develop headaches.  You may have heartburn.  You may develop constipation.  You may develop hemorrhoids or swollen, bulging veins (varicose veins).  Your gums may bleed and may be sensitive to brushing and flossing.  You may urinate more often because the fetus is pressing on your bladder.  You may have back pain. This is caused by: ? Weight gain. ? Pregnancy hormones that  are relaxing the joints in your pelvis. ? A shift in weight and the muscles that support your balance. Follow these instructions at home: Medicines  Follow your health care provider's instructions regarding medicine use. Specific medicines may be either safe or unsafe to take during pregnancy. Do not take any medicines unless approved by your health care provider.  Take a prenatal vitamin that contains at least 600 micrograms (mcg) of folic acid. Eating and drinking  Eat a healthy diet that includes fresh fruits and vegetables, whole grains, good sources of protein such as meat, eggs, or tofu, and low-fat dairy products.  Avoid raw meat and unpasteurized juice, milk, and cheese. These carry germs that can harm you and your baby.  You may need to take these actions to prevent or treat constipation: ? Drink enough fluid to keep your urine pale yellow. ? Eat foods that are high in fiber, such as beans, whole grains, and fresh fruits and vegetables. ? Limit foods that are high in fat and processed sugars, such as fried or sweet foods. Activity  Exercise only as directed by your health care provider. Most people can continue their usual exercise routine during pregnancy. Try to exercise for 30 minutes at least 5 days a week. Stop exercising if you develop contractions in your uterus.  Stop exercising if you develop pain or cramping in the lower abdomen or lower back.  Avoid exercising if it is very hot or humid or if you are at  a high altitude.  Avoid heavy lifting.  If you choose to, you may have sex unless your health care provider tells you not to. Relieving pain and discomfort  Wear a supportive bra to prevent discomfort from breast tenderness.  Take warm sitz baths to soothe any pain or discomfort caused by hemorrhoids. Use hemorrhoid cream if your health care provider approves.  Rest with your legs raised (elevated) if you have leg cramps or low back pain.  If you develop  varicose veins: ? Wear support hose as told by your health care provider. ? Elevate your feet for 15 minutes, 3-4 times a day. ? Limit salt in your diet. Safety  Wear your seat belt at all times when driving or riding in a car.  Talk with your health care provider if someone is verbally or physically abusive to you. Lifestyle  Do not use hot tubs, steam rooms, or saunas.  Do not douche. Do not use tampons or scented sanitary pads.  Avoid cat litter boxes and soil used by cats. These carry germs that can cause birth defects in the baby and possibly loss of the fetus by miscarriage or stillbirth.  Do not use herbal remedies, alcohol, illegal drugs, or medicines that are not approved by your health care provider. Chemicals in these products can harm your baby.  Do not use any products that contain nicotine or tobacco, such as cigarettes, e-cigarettes, and chewing tobacco. If you need help quitting, ask your health care provider. General instructions  During a routine prenatal visit, your health care provider will do a physical exam and other tests. He or she will also discuss your overall health. Keep all follow-up visits. This is important.  Ask your health care provider for a referral to a local prenatal education class.  Ask for help if you have counseling or nutritional needs during pregnancy. Your health care provider can offer advice or refer you to specialists for help with various needs. Where to find more information  American Pregnancy Association: americanpregnancy.[place] and Gynecologists: PoolDevices.com.pt  Office on [employer]: KeywordPortfolios.com.br Contact a health care provider if you have:  A headache that does not go away when you take medicine.  Vision changes or you see spots in front of your eyes.  Mild pelvic cramps, pelvic pressure, or nagging pain in the abdominal area.  Persistent nausea,  vomiting, or diarrhea.  A bad-smelling vaginal discharge or foul-smelling urine.  Pain when you urinate.  Sudden or extreme swelling of your face, hands, ankles, feet, or legs.  A fever. Get help right away if you:  Have fluid leaking from your vagina.  Have spotting or bleeding from your vagina.  Have severe abdominal cramping or pain.  Have difficulty breathing.  Have chest pain.  Have fainting spells.  Have not felt your baby move for the time period told by your health care provider.  Have new or increased pain, swelling, or redness in an arm or leg. Summary  The second trimester of pregnancy is from week 13 through week 27 (months 4 through 6).  Do not use herbal remedies, alcohol, illegal drugs, or medicines that are not approved by your health care provider. Chemicals in these products can harm your baby.  Exercise only as directed by your health care provider. Most people can continue their usual exercise routine during pregnancy.  Keep all follow-up visits. This is important. This information is not intended to replace advice given to you by your  health care provider. Make sure you discuss any questions you have with your health care provider. Document Revised: 09/16/2019 Document Reviewed: 07/23/2019 Elsevier Patient Education  2021 [employer].

## 2020-08-19 LAB — ALPHA FETOPROTEIN, MATERNAL
AFP MoM: 1.35
AFP, Serum: 60.6 ng/mL
Calc'd Gestational Age: 18.4 weeks
Maternal Wt: 160 [lb_av]
Risk for ONTD: 1
Twins-AFP: 1

## 2020-08-19 LAB — ANTIBODY ID, TITER, AND TYPING, RBC: Titer: 1:128 {titer}

## 2020-08-20 LAB — URINE CULTURE, OB REFLEX

## 2020-08-20 LAB — CULTURE, OB URINE

## 2020-08-22 ENCOUNTER — Other Ambulatory Visit: Payer: Self-pay

## 2020-08-22 ENCOUNTER — Encounter: Payer: Self-pay | Admitting: *Deleted

## 2020-08-22 ENCOUNTER — Other Ambulatory Visit: Payer: Self-pay | Admitting: *Deleted

## 2020-08-22 ENCOUNTER — Other Ambulatory Visit: Payer: Self-pay | Admitting: Obstetrics and Gynecology

## 2020-08-22 ENCOUNTER — Ambulatory Visit (HOSPITAL_BASED_OUTPATIENT_CLINIC_OR_DEPARTMENT_OTHER): Payer: No Typology Code available for payment source | Admitting: *Deleted

## 2020-08-22 ENCOUNTER — Ambulatory Visit: Payer: No Typology Code available for payment source | Attending: Obstetrics and Gynecology

## 2020-08-22 VITALS — BP 124/66 | HR 72

## 2020-08-22 DIAGNOSIS — Z363 Encounter for antenatal screening for malformations: Secondary | ICD-10-CM | POA: Insufficient documentation

## 2020-08-22 DIAGNOSIS — O360920 Maternal care for other rhesus isoimmunization, second trimester, not applicable or unspecified: Secondary | ICD-10-CM | POA: Insufficient documentation

## 2020-08-22 DIAGNOSIS — O09522 Supervision of elderly multigravida, second trimester: Secondary | ICD-10-CM

## 2020-08-22 DIAGNOSIS — O361120 Maternal care for Anti-A sensitization, second trimester, not applicable or unspecified: Secondary | ICD-10-CM

## 2020-08-22 DIAGNOSIS — O0990 Supervision of high risk pregnancy, unspecified, unspecified trimester: Secondary | ICD-10-CM | POA: Insufficient documentation

## 2020-08-22 DIAGNOSIS — Z3A19 19 weeks gestation of pregnancy: Secondary | ICD-10-CM | POA: Insufficient documentation

## 2020-08-22 DIAGNOSIS — O361920 Maternal care for other isoimmunization, second trimester, not applicable or unspecified: Secondary | ICD-10-CM | POA: Insufficient documentation

## 2020-08-22 DIAGNOSIS — O361190 Maternal care for Anti-A sensitization, unspecified trimester, not applicable or unspecified: Secondary | ICD-10-CM

## 2020-08-22 NOTE — Progress Notes (Signed)
MFM Consult Note  Lindsey Sellers was seen for a detailed anatomy scan and consultation due to isoimmunization and advanced maternal age.  The patient reports that her first pregnancy in 2019 was relatively uneventful until after delivery where she experienced a postpartum hemorrhage requiring the transfusion of 2 units of packed red blood cells.  She was then readmitted two weeks later for a uterine infection and required another transfusion of 2 units of packed red blood cells.  She believes that it was during the blood transfusions that she was exposed to the Kell antigen.  Her husband has screened negative for the Kell antigen.  However, he has screened positive for the big C and little c antigens.  On her most recent antibody titer levels drawn on August 18, 2020, her Kell antibody titer levels rose from 1:32 to 1:128.  Her anti-little c antibody level was less than 1:1.  I confirmed this titer level by calling [facility] as the titer levels were a little unclear the way they were presented in Epic.  She had a cell free DNA test drawn earlier in her pregnancy which indicated a low risk for trisomy 43, 48, and 13.  A female fetus is predicted.  The fetal growth and amniotic fluid level appeared appropriate for her gestational age.    There were no obvious anomalies noted on today's exam.  The limitations of ultrasound in the detection of all anomalies was discussed.  The peak systolic velocity of the middle cerebral arteries performed today did not indicate that her fetus is anemic at this time.  There were no signs of fetal hydrops noted today.  The implications and management of isoimmunization to the Kell and c antigens were discussed with the patient and her husband today.  They were reassured that as her husband has screened negative for the Kell antigen, her baby should not be affected by the anti-Kell antibodies noted in her blood.  She most likely became sensitized to the Kell antigen through  one of her prior blood transfusions.  The reason why her anti-Kell antibody titer levels jumped from 1:32 to 1:128 remains undetermined.    As her anti-little C antibody titer level was less than 1:1, she was reassured that her baby's risk of anemia is most likely very low.    As the Kell antibodies can cause significant fetal anemia and the cause of the significant rise in her anti-Kell antibody titer levels remain undetermined, we will continue to follow her with serial measurements of the peak systolic velocity of the middle cerebral arteries to screen for fetal anemia.  The couple was reassured that her fetus is most likely not anemic at this time based on the normal middle cerebral artery Doppler studies obtained today.  The availability of an amniocentesis for definitive diagnosis of fetal aneuploidy and to determine if the fetus carries the big C, little c, and Kell antigens was discussed.  The couple declined this procedure today.  A follow-up exam was scheduled in 2 weeks.    She should have repeat antibody titer levels drawn in early June to determine if the anti-Kell antibody levels increases or decreases.    A total of 30 minutes was spent counseling and coordinating the care for this patient.  Greater than 50 minutes was spent in direct face-to-face contact.

## 2020-09-06 ENCOUNTER — Encounter: Payer: Self-pay | Admitting: *Deleted

## 2020-09-06 ENCOUNTER — Ambulatory Visit: Payer: No Typology Code available for payment source | Admitting: *Deleted

## 2020-09-06 ENCOUNTER — Other Ambulatory Visit: Payer: Self-pay

## 2020-09-06 ENCOUNTER — Other Ambulatory Visit: Payer: Self-pay | Admitting: *Deleted

## 2020-09-06 ENCOUNTER — Ambulatory Visit: Payer: No Typology Code available for payment source | Attending: Obstetrics

## 2020-09-06 VITALS — BP 120/69 | HR 79

## 2020-09-06 DIAGNOSIS — O09522 Supervision of elderly multigravida, second trimester: Secondary | ICD-10-CM | POA: Diagnosis present

## 2020-09-06 DIAGNOSIS — O322XX0 Maternal care for transverse and oblique lie, not applicable or unspecified: Secondary | ICD-10-CM

## 2020-09-06 DIAGNOSIS — O361190 Maternal care for Anti-A sensitization, unspecified trimester, not applicable or unspecified: Secondary | ICD-10-CM | POA: Insufficient documentation

## 2020-09-06 DIAGNOSIS — Z3A21 21 weeks gestation of pregnancy: Secondary | ICD-10-CM

## 2020-09-06 DIAGNOSIS — O361120 Maternal care for Anti-A sensitization, second trimester, not applicable or unspecified: Secondary | ICD-10-CM | POA: Diagnosis not present

## 2020-09-06 DIAGNOSIS — O09292 Supervision of pregnancy with other poor reproductive or obstetric history, second trimester: Secondary | ICD-10-CM | POA: Diagnosis not present

## 2020-09-06 DIAGNOSIS — O361990 Maternal care for other isoimmunization, unspecified trimester, not applicable or unspecified: Secondary | ICD-10-CM

## 2020-09-12 ENCOUNTER — Other Ambulatory Visit: Payer: Self-pay

## 2020-09-12 ENCOUNTER — Ambulatory Visit (INDEPENDENT_AMBULATORY_CARE_PROVIDER_SITE_OTHER): Payer: No Typology Code available for payment source | Admitting: Obstetrics & Gynecology

## 2020-09-12 VITALS — BP 106/70 | HR 73 | Wt 170.0 lb

## 2020-09-12 DIAGNOSIS — O0990 Supervision of high risk pregnancy, unspecified, unspecified trimester: Secondary | ICD-10-CM

## 2020-09-12 DIAGNOSIS — Z3A22 22 weeks gestation of pregnancy: Secondary | ICD-10-CM

## 2020-09-12 NOTE — Progress Notes (Signed)
   PRENATAL VISIT NOTE  Subjective:  Lindsey Sellers is a 35 y.o. G3P1011 at [redacted]w[redacted]d being seen today for ongoing prenatal care.  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  Patient reports no complaints.  Contractions: Not present. Vag. Bleeding: None.  Movement: Present. Denies leaking of fluid.   The following portions of the patient's history were reviewed and updated as appropriate: allergies, current medications, past family history, past medical history, past social history, past surgical history and problem list.   Objective:   Vitals:   09/12/20 1016  BP: 106/70  Pulse: 73  Weight: 170 lb (77.1 kg)    Fetal Status: Fetal Heart Rate (bpm): 152   Movement: Present     General:  Alert, oriented and cooperative. Patient is in no acute distress.  Skin: Skin is warm and dry. No rash noted.   Cardiovascular: Normal heart rate noted  Respiratory: Normal respiratory effort, no problems with respiration noted  Abdomen: Soft, gravid, appropriate for gestational age.  Pain/Pressure: Absent     Pelvic: Cervical exam deferred        Extremities: Normal range of motion.  Edema: None  Mental Status: Normal mood and affect. Normal behavior. Normal judgment and thought content.   Assessment and Plan:  Pregnancy: G3P1011 at [redacted]w[redacted]d 1. [redacted] weeks gestation of pregnancy;  anti Kell, Anti little c antibodies; thought to be from transfusion she received after birth of first child.  Husband and pt are Kell negative.  Husband getting little c tested through  genetics.   - ANTIBODY ID, TITER, AND TYPING, RBC  2.  Covid 19  Pt has received 2 doses and booster;   Preterm labor symptoms and general obstetric precautions including but not limited to vaginal bleeding, contractions, leaking of fluid and fetal movement were reviewed in detail with the patient. Please refer to After Visit Summary for other counseling recommendations.   No follow-ups on file.  Future Appointments  Date Time Provider [place]  09/20/2020  1:30 PM [facility] NURSE [facility]  09/20/2020  1:45 PM [facility] US4 [facility]  10/04/2020  8:30 AM [facility] NURSE [facility]  10/04/2020  8:45 AM [facility] US4 [facility]  10/18/2020  7:45 AM [facility] NURSE [facility]  10/18/2020  8:00 AM [facility] US1 [facility]    Silas [place], MD

## 2020-09-13 LAB — ANTIBODY ID, TITER, AND TYPING, RBC: Titer: 1:128 {titer}

## 2020-09-20 ENCOUNTER — Ambulatory Visit: Payer: No Typology Code available for payment source | Admitting: *Deleted

## 2020-09-20 ENCOUNTER — Other Ambulatory Visit: Payer: Self-pay | Admitting: *Deleted

## 2020-09-20 ENCOUNTER — Other Ambulatory Visit: Payer: Self-pay

## 2020-09-20 ENCOUNTER — Encounter: Payer: Self-pay | Admitting: *Deleted

## 2020-09-20 ENCOUNTER — Ambulatory Visit: Payer: No Typology Code available for payment source | Attending: Obstetrics

## 2020-09-20 VITALS — BP 109/64 | HR 68

## 2020-09-20 DIAGNOSIS — O09522 Supervision of elderly multigravida, second trimester: Secondary | ICD-10-CM | POA: Diagnosis not present

## 2020-09-20 DIAGNOSIS — O361190 Maternal care for Anti-A sensitization, unspecified trimester, not applicable or unspecified: Secondary | ICD-10-CM | POA: Insufficient documentation

## 2020-09-20 DIAGNOSIS — O361120 Maternal care for Anti-A sensitization, second trimester, not applicable or unspecified: Secondary | ICD-10-CM | POA: Diagnosis present

## 2020-09-20 DIAGNOSIS — O322XX0 Maternal care for transverse and oblique lie, not applicable or unspecified: Secondary | ICD-10-CM | POA: Diagnosis not present

## 2020-09-20 DIAGNOSIS — O361920 Maternal care for other isoimmunization, second trimester, not applicable or unspecified: Secondary | ICD-10-CM

## 2020-09-20 DIAGNOSIS — O09292 Supervision of pregnancy with other poor reproductive or obstetric history, second trimester: Secondary | ICD-10-CM | POA: Diagnosis not present

## 2020-09-20 DIAGNOSIS — Z3A23 23 weeks gestation of pregnancy: Secondary | ICD-10-CM

## 2020-09-21 HISTORY — PX: DILATION AND CURETTAGE OF UTERUS: SHX78

## 2020-10-04 ENCOUNTER — Ambulatory Visit: Payer: No Typology Code available for payment source | Attending: Obstetrics and Gynecology

## 2020-10-04 ENCOUNTER — Ambulatory Visit: Payer: No Typology Code available for payment source | Admitting: *Deleted

## 2020-10-04 ENCOUNTER — Encounter: Payer: Self-pay | Admitting: *Deleted

## 2020-10-04 ENCOUNTER — Other Ambulatory Visit: Payer: Self-pay

## 2020-10-04 ENCOUNTER — Other Ambulatory Visit: Payer: Self-pay | Admitting: *Deleted

## 2020-10-04 VITALS — BP 122/76 | HR 89

## 2020-10-04 DIAGNOSIS — Z3A25 25 weeks gestation of pregnancy: Secondary | ICD-10-CM | POA: Diagnosis not present

## 2020-10-04 DIAGNOSIS — O361920 Maternal care for other isoimmunization, second trimester, not applicable or unspecified: Secondary | ICD-10-CM

## 2020-10-04 DIAGNOSIS — O09522 Supervision of elderly multigravida, second trimester: Secondary | ICD-10-CM | POA: Diagnosis present

## 2020-10-04 DIAGNOSIS — O09292 Supervision of pregnancy with other poor reproductive or obstetric history, second trimester: Secondary | ICD-10-CM

## 2020-10-04 DIAGNOSIS — O361120 Maternal care for Anti-A sensitization, second trimester, not applicable or unspecified: Secondary | ICD-10-CM

## 2020-10-04 DIAGNOSIS — O361990 Maternal care for other isoimmunization, unspecified trimester, not applicable or unspecified: Secondary | ICD-10-CM | POA: Diagnosis present

## 2020-10-06 ENCOUNTER — Other Ambulatory Visit: Payer: Self-pay

## 2020-10-06 ENCOUNTER — Encounter: Payer: Self-pay | Admitting: Obstetrics and Gynecology

## 2020-10-06 ENCOUNTER — Ambulatory Visit (INDEPENDENT_AMBULATORY_CARE_PROVIDER_SITE_OTHER): Payer: No Typology Code available for payment source | Admitting: Obstetrics and Gynecology

## 2020-10-06 VITALS — BP 112/73 | HR 76 | Wt 174.0 lb

## 2020-10-06 DIAGNOSIS — O0990 Supervision of high risk pregnancy, unspecified, unspecified trimester: Secondary | ICD-10-CM

## 2020-10-06 DIAGNOSIS — O361920 Maternal care for other isoimmunization, second trimester, not applicable or unspecified: Secondary | ICD-10-CM

## 2020-10-06 DIAGNOSIS — O09522 Supervision of elderly multigravida, second trimester: Secondary | ICD-10-CM

## 2020-10-06 DIAGNOSIS — Z8759 Personal history of other complications of pregnancy, childbirth and the puerperium: Secondary | ICD-10-CM

## 2020-10-06 DIAGNOSIS — Z3A25 25 weeks gestation of pregnancy: Secondary | ICD-10-CM

## 2020-10-06 DIAGNOSIS — R768 Other specified abnormal immunological findings in serum: Secondary | ICD-10-CM

## 2020-10-06 DIAGNOSIS — Z8679 Personal history of other diseases of the circulatory system: Secondary | ICD-10-CM

## 2020-10-06 DIAGNOSIS — O09299 Supervision of pregnancy with other poor reproductive or obstetric history, unspecified trimester: Secondary | ICD-10-CM

## 2020-10-06 NOTE — Progress Notes (Signed)
   PRENATAL VISIT NOTE  Subjective:  Lindsey Sellers is a 35 y.o. G3P1011 at [redacted]w[redacted]d being seen today for ongoing prenatal care.  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  Patient reports no complaints.  Contractions: Not present. Vag. Bleeding: None.  Movement: Present. Denies leaking of fluid.   The following portions of the patient's history were reviewed and updated as appropriate: allergies, current medications, past family history, past medical history, past social history, past surgical history and problem list.   Objective:   Vitals:   10/06/20 1013  BP: 112/73  Pulse: 76  Weight: 174 lb (78.9 kg)    Fetal Status: Fetal Heart Rate (bpm): 141   Movement: Present     General:  Alert, oriented and cooperative. Patient is in no acute distress.  Skin: Skin is warm and dry. No rash noted.   Cardiovascular: Normal heart rate noted  Respiratory: Normal respiratory effort, no problems with respiration noted  Abdomen: Soft, gravid, appropriate for gestational age.  Pain/Pressure: Absent     Pelvic: Cervical exam deferred        Extremities: Normal range of motion.  Edema: None  Mental Status: Normal mood and affect. Normal behavior. Normal judgment and thought content.   Assessment and Plan:  Pregnancy: G3P1011 at [redacted]w[redacted]d  1. Multigravida of advanced maternal age in second trimester  2. Kell isoimmunization during pregnancy in second trimester, single or unspecified fetus - MCA dopplers normal - cont bi weekly dopplers - repeat titer  today  3. H/O postpartum hemorrhage, currently pregnant  4. Red blood cell antibody positive  5. History of postpartum hypertension  6. Supervision of high risk pregnancy, antepartum  7. Maternal atypical antibody affecting pregnancy in second trimester, single or unspecified fetus  8. [redacted] weeks gestation of pregnancy   Preterm labor symptoms and general obstetric precautions including but not limited to vaginal bleeding, contractions, leaking of fluid and fetal movement were reviewed in detail with the patient. Please refer to After Visit Summary for other counseling recommendations.   Return in about 3 weeks (around 10/27/2020) for high OB, in person, 2 hr GTT, 3rd trim labs.  Future Appointments  Date Time Provider [place]  10/18/2020  7:45 AM [facility] NURSE [facility]  10/18/2020  8:00 AM [facility] US1 [facility]  11/02/2020  8:30 AM [facility] NURSE [facility]  11/02/2020  8:45 AM [facility] US4 [facility] [place]    Sloan Leiter, MD

## 2020-10-07 LAB — ANTIBODY ID, TITER, AND TYPING, RBC: Titer: 1:128 {titer}

## 2020-10-18 ENCOUNTER — Other Ambulatory Visit: Payer: Self-pay

## 2020-10-18 ENCOUNTER — Ambulatory Visit: Payer: No Typology Code available for payment source | Admitting: *Deleted

## 2020-10-18 ENCOUNTER — Encounter: Payer: Self-pay | Admitting: *Deleted

## 2020-10-18 ENCOUNTER — Other Ambulatory Visit: Payer: Self-pay | Admitting: *Deleted

## 2020-10-18 ENCOUNTER — Ambulatory Visit: Payer: No Typology Code available for payment source | Attending: Diagnostic Radiology

## 2020-10-18 VITALS — BP 108/68 | HR 80

## 2020-10-18 DIAGNOSIS — Z3A27 27 weeks gestation of pregnancy: Secondary | ICD-10-CM | POA: Diagnosis not present

## 2020-10-18 DIAGNOSIS — O09522 Supervision of elderly multigravida, second trimester: Secondary | ICD-10-CM

## 2020-10-18 DIAGNOSIS — O361120 Maternal care for Anti-A sensitization, second trimester, not applicable or unspecified: Secondary | ICD-10-CM

## 2020-10-18 DIAGNOSIS — O09292 Supervision of pregnancy with other poor reproductive or obstetric history, second trimester: Secondary | ICD-10-CM | POA: Diagnosis not present

## 2020-10-18 DIAGNOSIS — O361920 Maternal care for other isoimmunization, second trimester, not applicable or unspecified: Secondary | ICD-10-CM | POA: Diagnosis not present

## 2020-10-18 DIAGNOSIS — O361990 Maternal care for other isoimmunization, unspecified trimester, not applicable or unspecified: Secondary | ICD-10-CM | POA: Insufficient documentation

## 2020-10-31 ENCOUNTER — Ambulatory Visit (INDEPENDENT_AMBULATORY_CARE_PROVIDER_SITE_OTHER): Payer: No Typology Code available for payment source | Admitting: Obstetrics & Gynecology

## 2020-10-31 ENCOUNTER — Other Ambulatory Visit: Payer: Self-pay

## 2020-10-31 VITALS — BP 118/79 | HR 69 | Wt 176.0 lb

## 2020-10-31 DIAGNOSIS — O361930 Maternal care for other isoimmunization, third trimester, not applicable or unspecified: Secondary | ICD-10-CM

## 2020-10-31 DIAGNOSIS — O0990 Supervision of high risk pregnancy, unspecified, unspecified trimester: Secondary | ICD-10-CM

## 2020-10-31 NOTE — Progress Notes (Signed)
Pt will get Tdap at next visit    PRENATAL VISIT NOTE  Subjective:  Lindsey Sellers is a 35 y.o. G3P1011 at [redacted]w[redacted]d being seen today for ongoing prenatal care.  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  Patient reports no complaints.  Contractions: Not present. Vag. Bleeding: None.  Movement: Present. Denies leaking of fluid.   The following portions of the patient's history were reviewed and updated as appropriate: allergies, current medications, past family history, past medical history, past social history, past surgical history and problem list.   Objective:   Vitals:   10/31/20 0822  BP: 118/79  Pulse: 69  Weight: 176 lb (79.8 kg)    Fetal Status: Fetal Heart Rate (bpm): 138   Movement: Present     General:  Alert, oriented and cooperative. Patient is in no acute distress.  Skin: Skin is warm and dry. No rash noted.   Cardiovascular: Normal heart rate noted  Respiratory: Normal respiratory effort, no problems with respiration noted  Abdomen: Soft, gravid, appropriate for gestational age.  Pain/Pressure: Absent     Pelvic: Cervical exam deferred        Extremities: Normal range of motion.  Edema: None  Mental Status: Normal mood and affect. Normal behavior. Normal judgment and thought content.   Assessment and Plan:  Pregnancy: G3P1011 at [redacted]w[redacted]d 1. Supervision of high risk pregnancy, antepartum - 2Hr GTT w/ 1 Hr Carpenter 75 g - HIV antibody (with reflex) - CBC - RPR - Pt prefers tdap next visit  - Remote monitoring--Baby  Rx--all BPs normal and compliant.  2. Kell isoimmunization during pregnancy in third trimester, single or unspecified fetus Followed by MFM.  MCA dopplers July 13 and 26.    Preterm labor symptoms and general obstetric precautions including but not limited to vaginal bleeding, contractions, leaking of fluid and fetal movement were reviewed in detail with the patient. Please refer to After Visit Summary for other counseling recommendations.   Return in about 4 weeks (around 11/28/2020).  Future Appointments  Date Time Provider [place]  11/02/2020  8:30 AM [facility] NURSE [facility]  11/02/2020  8:45 AM [facility] US4 [facility]  11/15/2020  7:45 AM [facility] NURSE [facility]  11/15/2020  8:00 AM [facility] US1 [facility]    Silas [place], MD

## 2020-11-01 LAB — CBC
HCT: 37.6 % (ref 35.0–45.0)
Hemoglobin: 12.5 g/dL (ref 11.7–15.5)
MCH: 31.6 pg (ref 27.0–33.0)
MCHC: 33.2 g/dL (ref 32.0–36.0)
MCV: 94.9 fL (ref 80.0–100.0)
MPV: 11 fL (ref 7.5–12.5)
Platelets: 185 Thousand/uL (ref 140–400)
RBC: 3.96 Million/uL (ref 3.80–5.10)
RDW: 13.2 % (ref 11.0–15.0)
WBC: 8.4 Thousand/uL (ref 3.8–10.8)

## 2020-11-01 LAB — HIV ANTIBODY (ROUTINE TESTING W REFLEX): HIV 1&2 Ab, 4th Generation: NONREACTIVE

## 2020-11-01 LAB — RPR: RPR Ser Ql: NONREACTIVE

## 2020-11-01 LAB — 2HR GTT W 1 HR, CARPENTER, 75 G
Glucose, 1 Hr, Gest: 146 mg/dL (ref 65–179)
Glucose, 2 Hr, Gest: 128 mg/dL (ref 65–152)
Glucose, Fasting, Gest: 70 mg/dL (ref 65–91)

## 2020-11-02 ENCOUNTER — Encounter: Payer: Self-pay | Admitting: *Deleted

## 2020-11-02 ENCOUNTER — Ambulatory Visit: Payer: No Typology Code available for payment source | Attending: Maternal & Fetal Medicine

## 2020-11-02 ENCOUNTER — Other Ambulatory Visit: Payer: Self-pay

## 2020-11-02 ENCOUNTER — Other Ambulatory Visit: Payer: Self-pay | Admitting: *Deleted

## 2020-11-02 ENCOUNTER — Ambulatory Visit: Payer: No Typology Code available for payment source | Admitting: *Deleted

## 2020-11-02 VITALS — BP 119/68 | HR 74

## 2020-11-02 DIAGNOSIS — O361930 Maternal care for other isoimmunization, third trimester, not applicable or unspecified: Secondary | ICD-10-CM | POA: Diagnosis not present

## 2020-11-02 DIAGNOSIS — O09523 Supervision of elderly multigravida, third trimester: Secondary | ICD-10-CM | POA: Diagnosis present

## 2020-11-02 DIAGNOSIS — O09293 Supervision of pregnancy with other poor reproductive or obstetric history, third trimester: Secondary | ICD-10-CM

## 2020-11-02 DIAGNOSIS — O361120 Maternal care for Anti-A sensitization, second trimester, not applicable or unspecified: Secondary | ICD-10-CM | POA: Insufficient documentation

## 2020-11-02 DIAGNOSIS — Z3A29 29 weeks gestation of pregnancy: Secondary | ICD-10-CM

## 2020-11-15 ENCOUNTER — Ambulatory Visit: Payer: No Typology Code available for payment source | Admitting: *Deleted

## 2020-11-15 ENCOUNTER — Encounter: Payer: Self-pay | Admitting: *Deleted

## 2020-11-15 ENCOUNTER — Ambulatory Visit: Payer: No Typology Code available for payment source | Attending: Obstetrics

## 2020-11-15 ENCOUNTER — Other Ambulatory Visit: Payer: Self-pay

## 2020-11-15 VITALS — BP 115/81 | HR 82

## 2020-11-15 DIAGNOSIS — O361930 Maternal care for other isoimmunization, third trimester, not applicable or unspecified: Secondary | ICD-10-CM | POA: Diagnosis not present

## 2020-11-15 DIAGNOSIS — Z3A31 31 weeks gestation of pregnancy: Secondary | ICD-10-CM | POA: Diagnosis not present

## 2020-11-15 DIAGNOSIS — O361120 Maternal care for Anti-A sensitization, second trimester, not applicable or unspecified: Secondary | ICD-10-CM | POA: Insufficient documentation

## 2020-11-15 DIAGNOSIS — O09293 Supervision of pregnancy with other poor reproductive or obstetric history, third trimester: Secondary | ICD-10-CM | POA: Diagnosis not present

## 2020-11-15 DIAGNOSIS — O09523 Supervision of elderly multigravida, third trimester: Secondary | ICD-10-CM

## 2020-11-28 ENCOUNTER — Other Ambulatory Visit: Payer: Self-pay

## 2020-11-28 ENCOUNTER — Ambulatory Visit (INDEPENDENT_AMBULATORY_CARE_PROVIDER_SITE_OTHER): Payer: No Typology Code available for payment source | Admitting: Obstetrics & Gynecology

## 2020-11-28 VITALS — BP 125/83 | HR 83 | Wt 182.0 lb

## 2020-11-28 DIAGNOSIS — Z23 Encounter for immunization: Secondary | ICD-10-CM

## 2020-11-28 DIAGNOSIS — O361930 Maternal care for other isoimmunization, third trimester, not applicable or unspecified: Secondary | ICD-10-CM

## 2020-11-28 DIAGNOSIS — O0990 Supervision of high risk pregnancy, unspecified, unspecified trimester: Secondary | ICD-10-CM

## 2020-11-28 NOTE — Progress Notes (Signed)
   PRENATAL VISIT NOTE  Subjective:  Lindsey Sellers is a 35 y.o. G3P1011 at 6w0dbeing seen today for ongoing prenatal care.  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  Patient reports no complaints.  Contractions: Not present. Vag. Bleeding: None.  Movement: Present. Denies leaking of fluid.   The following portions of the patient's history were reviewed and updated as appropriate: allergies, current medications, past family history, past medical history, past social history, past surgical history and problem list.   Objective:   Vitals:   11/28/20 0837  BP: 125/83  Pulse: 83  Weight: 182 lb (82.6 kg)    Fetal Status: Fetal Heart Rate (bpm): 145   Movement: Present     General:  Alert, oriented and cooperative. Patient is in no acute distress.  Skin: Skin is warm and dry. No rash noted.   Cardiovascular: Normal heart rate noted  Respiratory: Normal respiratory effort, no problems with respiration noted  Abdomen: Soft, gravid, appropriate for gestational age.  Pain/Pressure: Absent     Pelvic: Cervical exam deferred        Extremities: Normal range of motion.  Edema: Trace  Mental Status: Normal mood and affect. Normal behavior. Normal judgment and thought content.   Assessment and Plan:  Pregnancy: G3P1011 at 330w0d.  Hx of PPH with antibodies Patient will have an actively managed  labor.  Will avoid early amniotomy to decrease risk of chorioamnionitis.  Uterotonic's will be in the room and TXA should be administered prior to  delivery.  Patient will have 2 units typed and crossed given her antibodies.  2. Kell isoimmunization during pregnancy in third trimester, single or unspecified fetus Recommendations  She will return in 2 weeks for another MCA Doppler study.  As her husband is negative for the Kell antigen and her anti-  little C antibodies were undetectable, delivery may may occur  at between 39-40 weeks as long as the MCA Doppler studies  do not indicate fetal anemia. 3. Maternal atypical antibody affecting pregnancy in third trimester, single or unspecified fetus Recommendations  She will return in 2 weeks for another MCA Doppler study.  As her husband is negative for the Kell antigen and her anti-  little C antibodies were undetectable, delivery may may occur  at between 39-40 weeks as long as the MCA Doppler studies  do not indicate fetal anemia.  Preterm labor symptoms and general obstetric precautions including but not limited to vaginal bleeding, contractions, leaking of fluid and fetal movement were reviewed in detail with the patient. Please refer to After Visit Summary for other counseling recommendations.   No follow-ups on file.  Future Appointments  Date Time Provider De[place]8/01/2021  7:30 AM [facility]8/01/2021  7:45 AM W[facility]-MFC US5 [facility]8/24/2022  7:30 AM [facility] NURSE [facility]8/24/2022  7:45 AM WMC-MFC US4 W[facility]-MFCUS WMC    KeSilas SacramentoMD

## 2020-11-29 LAB — ANTIBODY ID, TITER, AND TYPING, RBC: Titer: 1:128 {titer}

## 2020-11-29 LAB — BASIC METABOLIC PANEL
BUN/Creatinine Ratio: 13 (calc) (ref 6–22)
BUN: 6 mg/dL — ABNORMAL LOW (ref 7–25)
CO2: 21 mmol/L (ref 20–32)
Calcium: 8.9 mg/dL (ref 8.6–10.2)
Chloride: 104 mmol/L (ref 98–110)
Creat: 0.46 mg/dL — ABNORMAL LOW (ref 0.50–0.97)
Glucose, Bld: 97 mg/dL (ref 65–139)
Potassium: 3.8 mmol/L (ref 3.5–5.3)
Sodium: 134 mmol/L — ABNORMAL LOW (ref 135–146)

## 2020-11-30 ENCOUNTER — Other Ambulatory Visit: Payer: Self-pay

## 2020-11-30 ENCOUNTER — Ambulatory Visit: Payer: No Typology Code available for payment source | Attending: Obstetrics and Gynecology

## 2020-11-30 ENCOUNTER — Encounter: Payer: Self-pay | Admitting: *Deleted

## 2020-11-30 ENCOUNTER — Ambulatory Visit: Payer: No Typology Code available for payment source | Admitting: *Deleted

## 2020-11-30 VITALS — BP 110/70 | HR 87

## 2020-11-30 DIAGNOSIS — O09293 Supervision of pregnancy with other poor reproductive or obstetric history, third trimester: Secondary | ICD-10-CM

## 2020-11-30 DIAGNOSIS — O361130 Maternal care for Anti-A sensitization, third trimester, not applicable or unspecified: Secondary | ICD-10-CM

## 2020-11-30 DIAGNOSIS — Z3A33 33 weeks gestation of pregnancy: Secondary | ICD-10-CM | POA: Diagnosis not present

## 2020-11-30 DIAGNOSIS — O361930 Maternal care for other isoimmunization, third trimester, not applicable or unspecified: Secondary | ICD-10-CM

## 2020-11-30 DIAGNOSIS — O09523 Supervision of elderly multigravida, third trimester: Secondary | ICD-10-CM | POA: Diagnosis not present

## 2020-12-09 ENCOUNTER — Encounter: Payer: Self-pay | Admitting: Family Medicine

## 2020-12-09 DIAGNOSIS — Z7189 Other specified counseling: Secondary | ICD-10-CM | POA: Insufficient documentation

## 2020-12-12 ENCOUNTER — Other Ambulatory Visit (HOSPITAL_COMMUNITY)
Admission: RE | Admit: 2020-12-12 | Discharge: 2020-12-12 | Disposition: A | Discharge status: Home / Self Care | Payer: No Typology Code available for payment source | Source: Ambulatory Visit | Attending: Obstetrics & Gynecology | Admitting: Obstetrics & Gynecology

## 2020-12-12 ENCOUNTER — Ambulatory Visit (INDEPENDENT_AMBULATORY_CARE_PROVIDER_SITE_OTHER): Payer: No Typology Code available for payment source | Admitting: Obstetrics & Gynecology

## 2020-12-12 ENCOUNTER — Other Ambulatory Visit: Payer: Self-pay

## 2020-12-12 VITALS — BP 116/75 | HR 80 | Wt 183.0 lb

## 2020-12-12 DIAGNOSIS — Z3A35 35 weeks gestation of pregnancy: Secondary | ICD-10-CM

## 2020-12-12 DIAGNOSIS — O361930 Maternal care for other isoimmunization, third trimester, not applicable or unspecified: Secondary | ICD-10-CM | POA: Insufficient documentation

## 2020-12-12 DIAGNOSIS — R768 Other specified abnormal immunological findings in serum: Secondary | ICD-10-CM

## 2020-12-12 NOTE — Progress Notes (Signed)
+   fetal movement. No complaints.     PRENATAL VISIT NOTE  Subjective:  Lindsey Sellers is a 35 y.o. G3P1011 at 79w0dbeing seen today for ongoing prenatal care.  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  Patient reports  pressure .  Contractions: Not present. Vag. Bleeding: None.  Movement: Present. Denies leaking of fluid.   The following portions of the patient's history were reviewed and updated as appropriate: allergies, current medications, past family history, past medical history, past social history, past surgical history and problem list.   Objective:   Vitals:   12/12/20 0849  BP: 116/75  Pulse: 80  Weight: 183 lb (83 kg)    Fetal Status: Fetal Heart Rate (bpm): 150   Movement: Present     General:  Alert, oriented and cooperative. Patient is in no acute distress.  Skin: Skin is warm and dry. No rash noted.   Cardiovascular: Normal heart rate noted  Respiratory: Normal respiratory effort, no problems with respiration noted  Abdomen: Soft, gravid, appropriate for gestational age.  Pain/Pressure: Absent     Pelvic: Cervical exam performed in the presence of a chaperone     1/50/-3/soft and posterior   Extremities: Normal range of motion.  Edema: None  Mental Status: Normal mood and affect. Normal behavior. Normal judgment and thought content.   Assessment and Plan:  Pregnancy: G3P1011 at 369w0dContinue MFM eval MCA dopplers for Anti K Cultures today, cervical check. MFM to determine induction  date.  Preterm labor symptoms and general obstetric precautions including but not limited to vaginal bleeding, contractions, leaking of fluid and fetal movement were reviewed in detail with the patient. Please refer to After Visit Summary for other counseling recommendations.   No follow-ups on file.  Future Appointments  Date Time Provider [place]8/24/2022  7:30 AM [facility]8/24/2022  7:45 AM WMC-MFC US4 W[facility]-MFCUS WMC    KeSilas SacramentoMD

## 2020-12-12 NOTE — Addendum Note (Signed)
Addended by: Lyndal Rainbow on: 12/12/2020 09:37 AM   Modules accepted: Orders

## 2020-12-13 LAB — CERVICOVAGINAL ANCILLARY ONLY
Chlamydia: NEGATIVE
Comment: NEGATIVE
Comment: NORMAL
Neisseria Gonorrhea: NEGATIVE

## 2020-12-14 ENCOUNTER — Encounter: Payer: Self-pay | Admitting: *Deleted

## 2020-12-14 ENCOUNTER — Ambulatory Visit: Payer: No Typology Code available for payment source | Attending: Obstetrics and Gynecology

## 2020-12-14 ENCOUNTER — Ambulatory Visit: Payer: No Typology Code available for payment source | Admitting: *Deleted

## 2020-12-14 ENCOUNTER — Other Ambulatory Visit: Payer: Self-pay | Admitting: Obstetrics and Gynecology

## 2020-12-14 ENCOUNTER — Other Ambulatory Visit: Payer: Self-pay

## 2020-12-14 VITALS — BP 139/86 | HR 79

## 2020-12-14 DIAGNOSIS — Z3A35 35 weeks gestation of pregnancy: Secondary | ICD-10-CM | POA: Diagnosis not present

## 2020-12-14 DIAGNOSIS — O361930 Maternal care for other isoimmunization, third trimester, not applicable or unspecified: Secondary | ICD-10-CM

## 2020-12-14 DIAGNOSIS — O09523 Supervision of elderly multigravida, third trimester: Secondary | ICD-10-CM | POA: Diagnosis not present

## 2020-12-14 DIAGNOSIS — O09293 Supervision of pregnancy with other poor reproductive or obstetric history, third trimester: Secondary | ICD-10-CM | POA: Diagnosis not present

## 2020-12-14 DIAGNOSIS — O361931 Maternal care for other isoimmunization, third trimester, fetus 1: Secondary | ICD-10-CM

## 2020-12-15 LAB — CULTURE, BETA STREP (GROUP B ONLY)
MICRO NUMBER:: 12276575
SPECIMEN QUALITY:: ADEQUATE

## 2020-12-19 ENCOUNTER — Other Ambulatory Visit: Payer: Self-pay

## 2020-12-19 ENCOUNTER — Ambulatory Visit (INDEPENDENT_AMBULATORY_CARE_PROVIDER_SITE_OTHER): Payer: No Typology Code available for payment source | Admitting: Obstetrics & Gynecology

## 2020-12-19 VITALS — BP 146/97 | HR 89 | Wt 185.0 lb

## 2020-12-19 DIAGNOSIS — O163 Unspecified maternal hypertension, third trimester: Secondary | ICD-10-CM

## 2020-12-19 DIAGNOSIS — O361930 Maternal care for other isoimmunization, third trimester, not applicable or unspecified: Secondary | ICD-10-CM

## 2020-12-19 DIAGNOSIS — O169 Unspecified maternal hypertension, unspecified trimester: Secondary | ICD-10-CM

## 2020-12-19 DIAGNOSIS — O0993 Supervision of high risk pregnancy, unspecified, third trimester: Secondary | ICD-10-CM | POA: Diagnosis not present

## 2020-12-19 DIAGNOSIS — Z3A36 36 weeks gestation of pregnancy: Secondary | ICD-10-CM

## 2020-12-19 DIAGNOSIS — O0990 Supervision of high risk pregnancy, unspecified, unspecified trimester: Secondary | ICD-10-CM

## 2020-12-19 DIAGNOSIS — R030 Elevated blood-pressure reading, without diagnosis of hypertension: Secondary | ICD-10-CM | POA: Insufficient documentation

## 2020-12-19 LAB — POCT URINALYSIS DIPSTICK OB
Glucose, UA: NEGATIVE
POC,PROTEIN,UA: NEGATIVE

## 2020-12-19 NOTE — Progress Notes (Signed)
   PRENATAL VISIT NOTE  Subjective:  Lindsey Sellers is a 35 y.o. G3P1011 at 5w0dbeing seen today for ongoing prenatal care.  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  Patient reports  URI symptoms; covid negative, rhiorrhea, mild headache (resolving with tyleno given in the office) .  Contractions: Irritability. Vag. Bleeding: None.  Movement: Present. Denies leaking of fluid.   The following portions of the patient's history were reviewed and updated as appropriate: allergies, current medications, past family history, past medical history, past social history, past surgical history and problem list.   Objective:   Vitals:   12/19/20 1111 12/19/20 1125  BP: (!) 144/95 (!) 146/97  Pulse: 89   Weight: 185 lb (83.9 kg)     Fetal Status: Fetal Heart Rate (bpm): 133   Movement: Present     General:  Alert, oriented and cooperative. Patient is in no acute distress.  Skin: Skin is warm and dry. No rash noted.   Cardiovascular: Normal heart rate noted  Respiratory: Normal respiratory effort, no problems with respiration noted  Abdomen: Soft, gravid, appropriate for gestational age.  Pain/Pressure: Present     Pelvic: Cervical exam deferred        Extremities: Normal range of motion.  Edema: Trace  Mental Status: Normal mood and affect. Normal behavior. Normal judgment and thought content.   Assessment and Plan:  Pregnancy: G3P1011 at 325w0d. Elevated blood pressure  affecting pregnancy, antepartum - NST Baseline:  140 Accelerations: present Decelerations: present Variability:moderate Interpretation:  Reactive NST  - Protein / creatinine ratio, urine - Comprehensive metabolic panel - CBC - POC Urinalysis Dipstick OB - Take BP daily with BRx  2. Supervision of high risk pregnancy, antepartum -Induction at 39 weeks if diagnosis of gestational hypertension or preeclampsia is not determined.  3. Kell isoimmunization during pregnancy in third trimester, single or unspecified fetus Normal MCA Dopplers.  Continue MFM surveillance until delivery.   Term labor symptoms and general obstetric precautions including but not limited to vaginal bleeding, contractions, leaking of fluid and fetal movement were reviewed in detail with the patient. Please refer to After Visit Summary for other counseling recommendations.   No follow-ups on file.  Future Appointments  Date Time Provider [place]9/09/2020  7:30 AM [facility] NURSE [facility]9/09/2020  7:45 AM WMC-MFC US4 [facility]9/11/2020  8:10 AM Constant, PeVickii ChafeMD [facility]  KeSilas SacramentoMD

## 2020-12-20 LAB — COMPREHENSIVE METABOLIC PANEL
AG Ratio: 1.4 (calc) (ref 1.0–2.5)
ALT: 11 U/L (ref 6–29)
AST: 14 U/L (ref 10–30)
Albumin: 3.9 g/dL (ref 3.6–5.1)
Alkaline phosphatase (APISO): 118 U/L (ref 31–125)
BUN: 7 mg/dL (ref 7–25)
CO2: 21 mmol/L (ref 20–32)
Calcium: 9.4 mg/dL (ref 8.6–10.2)
Chloride: 104 mmol/L (ref 98–110)
Creat: 0.53 mg/dL (ref 0.50–0.97)
Globulin: 2.8 g/dL (calc) (ref 1.9–3.7)
Glucose, Bld: 87 mg/dL (ref 65–99)
Potassium: 3.9 mmol/L (ref 3.5–5.3)
Sodium: 136 mmol/L (ref 135–146)
Total Bilirubin: 0.4 mg/dL (ref 0.2–1.2)
Total Protein: 6.7 g/dL (ref 6.1–8.1)

## 2020-12-20 LAB — CBC
HCT: 41.1 % (ref 35.0–45.0)
Hemoglobin: 14 g/dL (ref 11.7–15.5)
MCH: 32.3 pg (ref 27.0–33.0)
MCHC: 34.1 g/dL (ref 32.0–36.0)
MCV: 94.7 fL (ref 80.0–100.0)
MPV: 10.9 fL (ref 7.5–12.5)
Platelets: 199 10*3/uL (ref 140–400)
RBC: 4.34 10*6/uL (ref 3.80–5.10)
RDW: 13 % (ref 11.0–15.0)
WBC: 9.1 10*3/uL (ref 3.8–10.8)

## 2020-12-20 LAB — PROTEIN / CREATININE RATIO, URINE
Creatinine, Urine: 23 mg/dL (ref 20–275)
Protein/Creat Ratio: 174 mg/g creat (ref 24–184)
Protein/Creatinine Ratio: 0.174 mg/mg creat (ref 0.024–0.184)
Total Protein, Urine: 4 mg/dL — ABNORMAL LOW (ref 5–24)

## 2020-12-24 ENCOUNTER — Encounter (HOSPITAL_COMMUNITY): Payer: Self-pay | Admitting: Obstetrics and Gynecology

## 2020-12-24 ENCOUNTER — Other Ambulatory Visit: Payer: Self-pay

## 2020-12-24 ENCOUNTER — Inpatient Hospital Stay (HOSPITAL_COMMUNITY)
Admission: AD | Admit: 2020-12-24 | Discharge: 2020-12-25 | Disposition: A | Discharge status: Home / Self Care | Payer: No Typology Code available for payment source | Attending: Obstetrics and Gynecology | Admitting: Obstetrics and Gynecology

## 2020-12-24 DIAGNOSIS — O133 Gestational [pregnancy-induced] hypertension without significant proteinuria, third trimester: Secondary | ICD-10-CM | POA: Insufficient documentation

## 2020-12-24 DIAGNOSIS — O26893 Other specified pregnancy related conditions, third trimester: Secondary | ICD-10-CM | POA: Insufficient documentation

## 2020-12-24 DIAGNOSIS — R519 Headache, unspecified: Secondary | ICD-10-CM | POA: Insufficient documentation

## 2020-12-24 DIAGNOSIS — Z3A36 36 weeks gestation of pregnancy: Secondary | ICD-10-CM | POA: Diagnosis not present

## 2020-12-24 DIAGNOSIS — O09523 Supervision of elderly multigravida, third trimester: Secondary | ICD-10-CM | POA: Insufficient documentation

## 2020-12-24 DIAGNOSIS — R030 Elevated blood-pressure reading, without diagnosis of hypertension: Secondary | ICD-10-CM

## 2020-12-24 LAB — URINALYSIS, ROUTINE W REFLEX MICROSCOPIC
Bilirubin Urine: NEGATIVE
Glucose, UA: NEGATIVE mg/dL
Hgb urine dipstick: NEGATIVE
Ketones, ur: NEGATIVE mg/dL
Leukocytes,Ua: NEGATIVE
Nitrite: NEGATIVE
Protein, ur: NEGATIVE mg/dL
Specific Gravity, Urine: 1.003 — ABNORMAL LOW (ref 1.005–1.030)
pH: 6 (ref 5.0–8.0)

## 2020-12-24 MED ORDER — ACETAMINOPHEN 500 MG PO TABS
1000.0000 mg | ORAL_TABLET | Freq: Once | ORAL | Status: AC
  Administered 2020-12-24: 1000 mg via ORAL
  Filled 2020-12-24: qty 2

## 2020-12-24 NOTE — MAU Note (Signed)
Blood pressure high on Monday at office - had bloodwork and NST and it was okay.  Had elevated blood pressures on and off through day.  Had headache earlier and blood pressure was 170/90.  No bleeding. Having low back pain on drive here. Baby moving but little less in last hour. No leaking.

## 2020-12-24 NOTE — MAU Provider Note (Signed)
Chief Complaint:  Hypertension   Event Date/Time   First Provider Initiated Contact with Patient 12/24/20 2303      HPI: Lindsey Sellers is a 35 y.o. G3P1011 at 84w5dby LMP who presents to maternity admissions reporting headache starting this morning and BP at home of 170s/90s. Pt reports mild headache continues, improved but not resolved with PO fluids today. She has not tried any other treatments. She had HTN in her previous pregnancy but none this pregnancy until [place]  BPs on 8/23 were 140s/90s. She denies any visual disturbances or epigastric pain.  She does report irregular but sometimes painful uterine cramping that occurs intermittently x 2-3 days.   She reports good fetal movement, denies LOF, or vaginal bleeding.   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   HPV changes only    Past obstetric history: OB History  Gravida Para Term Preterm AB Living  _0 0 1 1  SAB IAB Ectopic Multiple Live Births  0 1 0 0 1    # Outcome Date GA Lbr Len/2nd Weight Sex Delivery Anes PTL Lv  3 Current           2 IAB 10/06/19 142w2d          Birth Comments: cystic hygroma, Trisomy 21  1 Term 03/05/18 4065w2d:27 / 02:00 3782 g M Vag-Spont None  LIV     Birth Comments: WNL    Past Surgical History: Past Surgical History:  Procedure Laterality Date   child birth  03/05/2018   COLPOSCOPY  2008   no biopsy done   WISDOM TOOTH EXTRACTION      Family History: Family History  Problem Relation Age of Onset   Cancer Paternal Grandmother        lymph node   Gout Father    Colon cancer Neg Hx    Breast cancer Neg Hx      Social History: Social History   Tobacco Use   Smoking status: Never   Smokeless tobacco: Never  Vaping Use   Vaping Use: Never used  Substance Use Topics   Alcohol use: Not Currently    Alcohol/week: 5.0 standard drinks    Types: 2 Glasses of wine, 3 Standard drinks or equivalent per week   Drug use: No    Allergies: No Known Allergies  Meds:  No medications prior to admission.    ROS:  Review of Systems   I have reviewed patient's Past Medical Hx, Surgical Hx, Family Hx, Social Hx, medications and allergies.   Physical Exam  Patient Vitals for the past 24 hrs:  BP Temp Temp src Pulse Resp SpO2 Height Weight  12/25/20 0123 132/88 -- -- 85 -- -- -- --  12/25/20 0122 132/88 (!) 97.4 F (36.3 C) Oral 85 17 99 % -- --  12/25/20 0045 114/79 -- -- 71 17 98 % -- --  12/25/20 0040 -- -- -- -- -- 99 % -- --  12/25/20 0035 -- -- -- -- -- 99 % -- --  12/25/20 0031 118/78 -- -- 71 18 -- -- --  12/25/20 0030 -- -- -- -- --  98 % -- --  12/25/20 0025 -- -- -- -- -- 99 % -- --  12/25/20 0020 -- -- -- -- -- 98 % -- --  12/25/20 0016 101/66 -- -- 73 -- -- -- --  12/25/20 0015 -- -- -- -- -- 99 % -- --  12/25/20 0010 -- -- -- -- -- 99 % -- --  12/25/20 0005 -- -- -- -- -- 98 % -- --  12/25/20 0001 120/73 -- -- 79 -- -- -- --  12/25/20 0000 -- -- -- -- -- 99 % -- --  12/24/20 2355 -- -- -- -- -- 98 % -- --  12/24/20 2350 -- -- -- -- -- 98 % -- --  12/24/20 2347 126/80 -- -- 79 18 -- -- --  12/24/20 2345 -- -- -- -- -- 98 % -- --  12/24/20 2340 -- -- -- -- -- 97 % -- --  12/24/20 2335 -- -- -- -- -- 98 % -- --  12/24/20 2330 -- -- -- -- -- 98 % -- --  12/24/20 2321 -- -- -- -- -- 99 % -- --  12/24/20 2320 -- -- -- -- -- 99 % -- --  12/24/20 2315 134/87 -- -- 90 17 99 % -- --  12/24/20 2310 -- -- -- -- -- 99 % -- --  12/24/20 2305 -- -- -- -- -- 98 % -- --  12/24/20 2300 134/83 -- -- 88 -- 99 % -- --  12/24/20 2255 -- -- -- -- -- 99 % -- --  12/24/20 2254 -- -- -- -- --  99 % -- --  12/24/20 2250 -- -- -- -- -- 98 % -- --  12/24/20 2245 133/85 -- -- 90 -- 99 % -- --  12/24/20 2230 (!) 137/91 -- -- 92 -- 99 % -- --  12/24/20 2225 132/89 -- -- 92 -- 99 % -- --  12/24/20 2220 -- -- -- -- -- 100 % -- --  12/24/20 2215 -- -- -- -- -- 100 % -- --  12/24/20 2211 (!) 137/95 -- -- 88 18 -- -- --  12/24/20 2144 (!) 159/90 98.3 F (36.8 C) Oral (!) 106 18 100 % -- --  12/24/20 2143 -- -- -- -- -- -- 5' 4" (1.626 m) 84.4 kg   Constitutional: Well-developed, well-nourished female in no acute distress.  HEART: normal rate, heart sounds, regular rhythm RESP: normal effort, lung sounds clear and equal bilaterally  GI: Abd soft, non-tender, gravid appropriate for gestational age.  MS: Extremities nontender, no edema, normal ROM Neurologic: Alert and oriented x 4.  GU: Neg CVAT.  Dilation: 2 Effacement (%): 50 Presentation: Vertex Exam by:: Fatima Blank CNM  FHT:  Baseline 135 , moderate variability, accelerations present, no decelerations Contractions: q 2-8 mins, mild to palpation   Labs: Results for orders placed or performed during the hospital encounter of 12/24/20 (from the past 24 hour(s))  Urinalysis, Routine w reflex microscopic Urine, Clean Catch     Status: Abnormal   Collection Time: 12/24/20 11:20 PM  Result Value Ref Range   Color, Urine STRAW (A) YELLOW   APPearance HAZY (A) CLEAR   Specific Gravity, Urine 1.003 (L) 1.005 - 1.030   pH 6.0 5.0 - 8.0   Glucose, UA NEGATIVE NEGATIVE mg/dL   Hgb urine dipstick NEGATIVE NEGATIVE   Bilirubin Urine NEGATIVE NEGATIVE   Ketones, ur NEGATIVE NEGATIVE mg/dL   Protein, ur NEGATIVE NEGATIVE mg/dL   Nitrite NEGATIVE NEGATIVE  Leukocytes,Ua NEGATIVE NEGATIVE  Protein / creatinine ratio, urine     Status: Abnormal   Collection Time: 12/24/20 11:20 PM  Result Value Ref Range   Creatinine, Urine 22.50 mg/dL   Total Protein, Urine 6 mg/dL   Protein Creatinine Ratio 0.27 (H) 0.00 - 0.15 mg/mg[Cre]   CBC     Status: Abnormal   Collection Time: 12/24/20 11:46 PM  Result Value Ref Range   WBC 9.8 4.0 - 10.5 K/uL   RBC 3.85 (L) 3.87 - 5.11 MIL/uL   Hemoglobin 12.3 12.0 - 15.0 g/dL   HCT 36.1 36.0 - 46.0 %   MCV 93.8 80.0 - 100.0 fL   MCH 31.9 26.0 - 34.0 pg   MCHC 34.1 30.0 - 36.0 g/dL   RDW 12.9 11.5 - 15.5 %   Platelets 195 150 - 400 K/uL   nRBC 0.0 0.0 - 0.2 %  Comprehensive metabolic panel     Status: Abnormal   Collection Time: 12/24/20 11:46 PM  Result Value Ref Range   Sodium 136 135 - 145 mmol/L   Potassium 4.1 3.5 - 5.1 mmol/L   Chloride 107 98 - 111 mmol/L   CO2 20 (L) 22 - 32 mmol/L   Glucose, Bld 89 70 - 99 mg/dL   BUN 7 6 - 20 mg/dL   Creatinine, Ser 0.60 0.44 - 1.00 mg/dL   Calcium 9.1 8.9 - 10.3 mg/dL   Total Protein 6.0 (L) 6.5 - 8.1 g/dL   Albumin 2.9 (L) 3.5 - 5.0 g/dL   AST 26 15 - 41 U/L   ALT 12 0 - 44 U/L   Alkaline Phosphatase 91 38 - 126 U/L   Total Bilirubin 0.8 0.3 - 1.2 mg/dL   GFR, Estimated >60 >60 mL/min   Anion gap 9 5 - 15   A/RH(D) POSITIVE/-- (03/03 1337)  Imaging:   MAU Course/MDM: Orders Placed This Encounter  Procedures   Urinalysis, Routine w reflex microscopic   CBC   Comprehensive metabolic panel   Protein / creatinine ratio, urine   Discharge patient    Meds ordered this encounter  Medications   acetaminophen (TYLENOL) tablet 1,000 mg     NST reviewed and reactive No evidence of active labor H/a resolved with Tylenol, PEC labs wnl, P/C ratio 0.27 D/C home but pt met criteria for GHTN today so IOL scheduled 12/29/20 at midnight.   PEC Precautions/reasons to come to MAU reviewed  Assessment: 1. Gestational hypertension, third trimester   2. [redacted] weeks gestation of pregnancy     Plan: Discharge home Labor precautions and fetal kick counts  Follow-up [place] [facility] Maternal Fetal [facility] at [facility] Follow up.   Specialty: Maternal and Fetal Medicine Why: As scheduled for your ultrasound  on 12/27/20 Contact information: [address] [place] [address] [phone]        [facility] Follow up.   Specialty: Obstetrics and Gynecology Why: Check in at the MAU desk for your scheduled induction of labor on Wednesday, 12/28/20 at 11:30 pm (before midnight).  Return to MAU sooner for signs of labor or emergencies. Contact information: [address] [id number] [place] [phone]               Allergies as of 12/25/2020   No Known Allergies      Medication List     TAKE these medications    PRENATAL VITAMIN PO Take by  mouth.        Fatima Blank Certified Nurse-Midwife 12/25/2020 3:31 AM

## 2020-12-25 ENCOUNTER — Other Ambulatory Visit: Payer: Self-pay | Admitting: Advanced Practice Midwife

## 2020-12-25 ENCOUNTER — Encounter (HOSPITAL_COMMUNITY): Payer: Self-pay | Admitting: Obstetrics and Gynecology

## 2020-12-25 LAB — COMPREHENSIVE METABOLIC PANEL
ALT: 12 U/L (ref 0–44)
AST: 26 U/L (ref 15–41)
Albumin: 2.9 g/dL — ABNORMAL LOW (ref 3.5–5.0)
Alkaline Phosphatase: 91 U/L (ref 38–126)
Anion gap: 9 (ref 5–15)
BUN: 7 mg/dL (ref 6–20)
CO2: 20 mmol/L — ABNORMAL LOW (ref 22–32)
Calcium: 9.1 mg/dL (ref 8.9–10.3)
Chloride: 107 mmol/L (ref 98–111)
Creatinine, Ser: 0.6 mg/dL (ref 0.44–1.00)
GFR, Estimated: >60 mL/min (ref >60)
Glucose, Bld: 89 mg/dL (ref 70–99)
Potassium: 4.1 mmol/L (ref 3.5–5.1)
Sodium: 136 mmol/L (ref 135–145)
Total Bilirubin: 0.8 mg/dL (ref 0.3–1.2)
Total Protein: 6 g/dL — ABNORMAL LOW (ref 6.5–8.1)

## 2020-12-25 LAB — CBC
HCT: 36.1 % (ref 36.0–46.0)
Hemoglobin: 12.3 g/dL (ref 12.0–15.0)
MCH: 31.9 pg (ref 26.0–34.0)
MCHC: 34.1 g/dL (ref 30.0–36.0)
MCV: 93.8 fL (ref 80.0–100.0)
Platelets: 195 10*3/uL (ref 150–400)
RBC: 3.85 MIL/uL — ABNORMAL LOW (ref 3.87–5.11)
RDW: 12.9 % (ref 11.5–15.5)
WBC: 9.8 10*3/uL (ref 4.0–10.5)
nRBC: 0 % (ref 0.0–0.2)

## 2020-12-25 LAB — PROTEIN / CREATININE RATIO, URINE
Creatinine, Urine: 22.5 mg/dL
Protein Creatinine Ratio: 0.27 mg/mg{Cre} — ABNORMAL HIGH (ref 0.00–0.15)
Total Protein, Urine: 6 mg/dL

## 2020-12-25 NOTE — Discharge Instructions (Signed)
Things to Try After 37 weeks to Encourage Labor/Get Ready for Labor:    Try the Miles Circuit at www.milescircuit.com daily to improve baby's position and encourage the onset of labor.  Walk a little and rest a little every day.  Change positions often.  Cervical Ripening: May try one or both Red Raspberry Leaf capsules or tea:  two 300mg or 400mg tablets with each meal, 2-3 times a day, or 1-3 cups of tea daily  Potential Side Effects Of Raspberry Leaf:  Most women do not experience any side effects from drinking raspberry leaf tea. However, nausea and loose stools are possible   Evening Primrose Oil capsules: take 1 capsule by mouth and place one capsule in the vagina every night.    Some of the potential side effects:  Upset stomach  Loose stools or diarrhea  Headaches  Nausea  Sex can also help the cervix ripen and encourage labor onset.   

## 2020-12-26 ENCOUNTER — Other Ambulatory Visit: Payer: Self-pay | Admitting: Advanced Practice Midwife

## 2020-12-27 ENCOUNTER — Other Ambulatory Visit: Payer: Self-pay | Admitting: Obstetrics and Gynecology

## 2020-12-27 ENCOUNTER — Ambulatory Visit: Payer: No Typology Code available for payment source | Admitting: *Deleted

## 2020-12-27 ENCOUNTER — Other Ambulatory Visit: Payer: Self-pay

## 2020-12-27 ENCOUNTER — Telehealth (HOSPITAL_COMMUNITY): Payer: Self-pay | Admitting: *Deleted

## 2020-12-27 ENCOUNTER — Encounter: Payer: Self-pay | Admitting: *Deleted

## 2020-12-27 ENCOUNTER — Ambulatory Visit (HOSPITAL_BASED_OUTPATIENT_CLINIC_OR_DEPARTMENT_OTHER): Payer: No Typology Code available for payment source

## 2020-12-27 VITALS — BP 140/82 | HR 83

## 2020-12-27 DIAGNOSIS — O09523 Supervision of elderly multigravida, third trimester: Secondary | ICD-10-CM

## 2020-12-27 DIAGNOSIS — R030 Elevated blood-pressure reading, without diagnosis of hypertension: Secondary | ICD-10-CM | POA: Insufficient documentation

## 2020-12-27 DIAGNOSIS — O361931 Maternal care for other isoimmunization, third trimester, fetus 1: Secondary | ICD-10-CM

## 2020-12-27 DIAGNOSIS — O133 Gestational [pregnancy-induced] hypertension without significant proteinuria, third trimester: Secondary | ICD-10-CM | POA: Diagnosis not present

## 2020-12-27 DIAGNOSIS — O361930 Maternal care for other isoimmunization, third trimester, not applicable or unspecified: Secondary | ICD-10-CM

## 2020-12-27 DIAGNOSIS — Z3A37 37 weeks gestation of pregnancy: Secondary | ICD-10-CM

## 2020-12-27 DIAGNOSIS — O09293 Supervision of pregnancy with other poor reproductive or obstetric history, third trimester: Secondary | ICD-10-CM

## 2020-12-27 LAB — SARS CORONAVIRUS 2 (TAT 6-24 HRS): SARS Coronavirus 2: NEGATIVE

## 2020-12-27 NOTE — Telephone Encounter (Signed)
Preadmission screen  

## 2020-12-27 NOTE — Procedures (Signed)
Lindsey Sellers May 21, 1985 [redacted]w[redacted]d Fetus A Non-Stress Test Interpretation for 12/27/20  Indication:  AMA, GHTN  Fetal Heart Rate A Mode: External Baseline Rate (A): 140 bpm Variability: Moderate Accelerations: 15 x 15 Decelerations: None Multiple birth?: No  Uterine Activity Mode: Palpation, Toco Contraction Frequency (min): Occas. Contraction Quality: Mild Resting Tone Palpated: Relaxed Resting Time: Adequate  Interpretation (Fetal Testing) Nonstress Test Interpretation: Reactive Comments: Dr. SDonalee Citrinreviewed tracing.

## 2020-12-29 ENCOUNTER — Inpatient Hospital Stay (HOSPITAL_COMMUNITY): Payer: No Typology Code available for payment source | Admitting: Anesthesiology

## 2020-12-29 ENCOUNTER — Inpatient Hospital Stay (HOSPITAL_COMMUNITY)
Admission: AD | Admit: 2020-12-29 | Discharge: 2020-12-31 | DRG: 807 | Disposition: A | Discharge status: Home / Self Care | Payer: No Typology Code available for payment source | Attending: Obstetrics & Gynecology | Admitting: Obstetrics & Gynecology

## 2020-12-29 ENCOUNTER — Encounter: Payer: No Typology Code available for payment source | Admitting: Obstetrics and Gynecology

## 2020-12-29 ENCOUNTER — Other Ambulatory Visit: Payer: Self-pay

## 2020-12-29 ENCOUNTER — Encounter (HOSPITAL_COMMUNITY): Payer: Self-pay | Admitting: Family Medicine

## 2020-12-29 ENCOUNTER — Inpatient Hospital Stay (HOSPITAL_COMMUNITY): Payer: No Typology Code available for payment source

## 2020-12-29 DIAGNOSIS — O361930 Maternal care for other isoimmunization, third trimester, not applicable or unspecified: Secondary | ICD-10-CM | POA: Diagnosis present

## 2020-12-29 DIAGNOSIS — O134 Gestational [pregnancy-induced] hypertension without significant proteinuria, complicating childbirth: Secondary | ICD-10-CM | POA: Diagnosis present

## 2020-12-29 DIAGNOSIS — O139 Gestational [pregnancy-induced] hypertension without significant proteinuria, unspecified trimester: Secondary | ICD-10-CM | POA: Diagnosis present

## 2020-12-29 DIAGNOSIS — Z3A37 37 weeks gestation of pregnancy: Secondary | ICD-10-CM | POA: Diagnosis not present

## 2020-12-29 LAB — PROTEIN / CREATININE RATIO, URINE
Creatinine, Urine: 52.3 mg/dL
Protein Creatinine Ratio: 0.15 mg/mg{Cre} (ref 0.00–0.15)
Total Protein, Urine: 8 mg/dL

## 2020-12-29 LAB — COMPREHENSIVE METABOLIC PANEL
ALT: 14 U/L (ref 0–44)
AST: 21 U/L (ref 15–41)
Albumin: 3.1 g/dL — ABNORMAL LOW (ref 3.5–5.0)
Alkaline Phosphatase: 93 U/L (ref 38–126)
Anion gap: 10 (ref 5–15)
BUN: 6 mg/dL (ref 6–20)
CO2: 19 mmol/L — ABNORMAL LOW (ref 22–32)
Calcium: 9.1 mg/dL (ref 8.9–10.3)
Chloride: 106 mmol/L (ref 98–111)
Creatinine, Ser: 0.71 mg/dL (ref 0.44–1.00)
GFR, Estimated: >60 mL/min (ref >60)
Glucose, Bld: 116 mg/dL — ABNORMAL HIGH (ref 70–99)
Potassium: 3.5 mmol/L (ref 3.5–5.1)
Sodium: 135 mmol/L (ref 135–145)
Total Bilirubin: 0.3 mg/dL (ref 0.3–1.2)
Total Protein: 6.3 g/dL — ABNORMAL LOW (ref 6.5–8.1)

## 2020-12-29 LAB — CBC
HCT: 40.6 % (ref 36.0–46.0)
Hemoglobin: 13.7 g/dL (ref 12.0–15.0)
MCH: 32.2 pg (ref 26.0–34.0)
MCHC: 33.7 g/dL (ref 30.0–36.0)
MCV: 95.3 fL (ref 80.0–100.0)
Platelets: 197 10*3/uL (ref 150–400)
RBC: 4.26 MIL/uL (ref 3.87–5.11)
RDW: 13 % (ref 11.5–15.5)
WBC: 10.3 10*3/uL (ref 4.0–10.5)
nRBC: 0 % (ref 0.0–0.2)

## 2020-12-29 LAB — RPR: RPR Ser Ql: NONREACTIVE

## 2020-12-29 MED ORDER — BENZOCAINE-MENTHOL 20-0.5 % EX AERO: 1.0000 "application " | INHALATION_SPRAY | CUTANEOUS | Status: DC | PRN

## 2020-12-29 MED ORDER — ZOLPIDEM TARTRATE 5 MG PO TABS: 5.0000 mg | ORAL_TABLET | Freq: Every evening | ORAL | Status: DC | PRN

## 2020-12-29 MED ORDER — DIPHENHYDRAMINE HCL 25 MG PO CAPS: 25.0000 mg | ORAL_CAPSULE | Freq: Four times a day (QID) | ORAL | Status: DC | PRN

## 2020-12-29 MED ORDER — OXYCODONE-ACETAMINOPHEN 5-325 MG PO TABS
2.0000 | ORAL_TABLET | ORAL | Status: DC | PRN
Start: 2020-12-29 — End: 2020-12-29

## 2020-12-29 MED ORDER — DIPHENHYDRAMINE HCL 50 MG/ML IJ SOLN: 12.5000 mg | INTRAMUSCULAR | Status: DC | PRN

## 2020-12-29 MED ORDER — OXYCODONE-ACETAMINOPHEN 5-325 MG PO TABS: 1.0000 | ORAL_TABLET | ORAL | Status: DC | PRN

## 2020-12-29 MED ORDER — SOD CITRATE-CITRIC ACID 500-334 MG/5ML PO SOLN: 30.0000 mL | ORAL | Status: DC | PRN

## 2020-12-29 MED ORDER — TERBUTALINE SULFATE 1 MG/ML IJ SOLN: 0.2500 mg | Freq: Once | INTRAMUSCULAR | Status: DC | PRN

## 2020-12-29 MED ORDER — MISOPROSTOL 50MCG HALF TABLET
50.0000 ug | ORAL_TABLET | ORAL | Status: DC | PRN
  Administered 2020-12-29: 50 ug via ORAL
  Filled 2020-12-29: qty 1

## 2020-12-29 MED ORDER — WITCH HAZEL-GLYCERIN EX PADS: 1.0000 "application " | MEDICATED_PAD | CUTANEOUS | Status: DC | PRN

## 2020-12-29 MED ORDER — COCONUT OIL OIL: 1.0000 "application " | TOPICAL_OIL | Status: DC | PRN

## 2020-12-29 MED ORDER — CARBOPROST TROMETHAMINE 250 MCG/ML IM SOLN
INTRAMUSCULAR | Status: AC
  Filled 2020-12-29: qty 1

## 2020-12-29 MED ORDER — ONDANSETRON HCL 4 MG/2ML IJ SOLN: 4.0000 mg | INTRAMUSCULAR | Status: DC | PRN

## 2020-12-29 MED ORDER — OXYTOCIN-SODIUM CHLORIDE 30-0.9 UT/500ML-% IV SOLN
2.5000 [IU]/h | INTRAVENOUS | Status: DC
  Filled 2020-12-29: qty 500

## 2020-12-29 MED ORDER — PRENATAL MULTIVITAMIN CH
1.0000 | ORAL_TABLET | Freq: Every day | ORAL | Status: DC
  Administered 2020-12-29 – 2020-12-31 (×3): 1 via ORAL
  Filled 2020-12-29 (×3): qty 1

## 2020-12-29 MED ORDER — ONDANSETRON HCL 4 MG/2ML IJ SOLN
4.0000 mg | Freq: Four times a day (QID) | INTRAMUSCULAR | Status: DC | PRN
  Administered 2020-12-29: 4 mg via INTRAVENOUS
  Filled 2020-12-29: qty 2

## 2020-12-29 MED ORDER — PHENYLEPHRINE 40 MCG/ML (10ML) SYRINGE FOR IV PUSH (FOR BLOOD PRESSURE SUPPORT): 80.0000 ug | PREFILLED_SYRINGE | INTRAVENOUS | Status: DC | PRN

## 2020-12-29 MED ORDER — TRANEXAMIC ACID-NACL 1000-0.7 MG/100ML-% IV SOLN
INTRAVENOUS | Status: AC
  Administered 2020-12-29: 1000 mg
  Filled 2020-12-29: qty 100

## 2020-12-29 MED ORDER — FENTANYL-BUPIVACAINE-NACL 0.5-0.125-0.9 MG/250ML-% EP SOLN
12.0000 mL/h | EPIDURAL | Status: DC | PRN
Start: 2020-12-29 — End: 2020-12-29

## 2020-12-29 MED ORDER — LACTATED RINGERS IV SOLN: 500.0000 mL | Freq: Once | INTRAVENOUS | Status: DC

## 2020-12-29 MED ORDER — MISOPROSTOL 200 MCG PO TABS
ORAL_TABLET | ORAL | Status: AC
  Filled 2020-12-29: qty 4

## 2020-12-29 MED ORDER — SENNOSIDES-DOCUSATE SODIUM 8.6-50 MG PO TABS
2.0000 | ORAL_TABLET | Freq: Every day | ORAL | Status: DC
  Administered 2020-12-30: 2 via ORAL
  Filled 2020-12-29 (×2): qty 2

## 2020-12-29 MED ORDER — SIMETHICONE 80 MG PO CHEW: 80.0000 mg | CHEWABLE_TABLET | ORAL | Status: DC | PRN

## 2020-12-29 MED ORDER — TETANUS-DIPHTH-ACELL PERTUSSIS 5-2.5-18.5 LF-MCG/0.5 IM SUSY: 0.5000 mL | PREFILLED_SYRINGE | Freq: Once | INTRAMUSCULAR | Status: DC

## 2020-12-29 MED ORDER — DIBUCAINE (PERIANAL) 1 % EX OINT: 1.0000 "application " | TOPICAL_OINTMENT | CUTANEOUS | Status: DC | PRN

## 2020-12-29 MED ORDER — LACTATED RINGERS IV SOLN: INTRAVENOUS | Status: DC

## 2020-12-29 MED ORDER — LIDOCAINE HCL (PF) 1 % IJ SOLN: 30.0000 mL | INTRAMUSCULAR | Status: DC | PRN

## 2020-12-29 MED ORDER — DIPHENOXYLATE-ATROPINE 2.5-0.025 MG PO TABS
2.0000 | ORAL_TABLET | Freq: Once | ORAL | Status: AC
  Administered 2020-12-29: 2 via ORAL
  Filled 2020-12-29: qty 2

## 2020-12-29 MED ORDER — CARBOPROST TROMETHAMINE 250 MCG/ML IM SOLN
250.0000 ug | Freq: Once | INTRAMUSCULAR | Status: AC
  Administered 2020-12-29: 250 ug via INTRAMUSCULAR

## 2020-12-29 MED ORDER — ONDANSETRON HCL 4 MG PO TABS: 4.0000 mg | ORAL_TABLET | ORAL | Status: DC | PRN

## 2020-12-29 MED ORDER — LACTATED RINGERS IV SOLN
500.0000 mL | INTRAVENOUS | Status: DC | PRN
  Administered 2020-12-29: 1000 mL via INTRAVENOUS

## 2020-12-29 MED ORDER — FENTANYL-BUPIVACAINE-NACL 0.5-0.125-0.9 MG/250ML-% EP SOLN
12.0000 mL/h | EPIDURAL | Status: DC | PRN
  Administered 2020-12-29: 12 mL/h via EPIDURAL
  Filled 2020-12-29: qty 250

## 2020-12-29 MED ORDER — EPHEDRINE 5 MG/ML INJ: 10.0000 mg | INTRAVENOUS | Status: DC | PRN

## 2020-12-29 MED ORDER — OXYTOCIN BOLUS FROM INFUSION
333.0000 mL | Freq: Once | INTRAVENOUS | Status: AC
  Administered 2020-12-29: 333 mL via INTRAVENOUS

## 2020-12-29 MED ORDER — LIDOCAINE HCL (PF) 1 % IJ SOLN
INTRAMUSCULAR | Status: DC | PRN
  Administered 2020-12-29: 8 mL via EPIDURAL

## 2020-12-29 MED ORDER — ACETAMINOPHEN 325 MG PO TABS: 650.0000 mg | ORAL_TABLET | ORAL | Status: DC | PRN

## 2020-12-29 MED ORDER — EPHEDRINE 5 MG/ML INJ
10.0000 mg | INTRAVENOUS | Status: DC | PRN
Start: 2020-12-29 — End: 2020-12-29

## 2020-12-29 MED ORDER — IBUPROFEN 600 MG PO TABS
600.0000 mg | ORAL_TABLET | Freq: Four times a day (QID) | ORAL | Status: DC
  Administered 2020-12-29 – 2020-12-31 (×9): 600 mg via ORAL
  Filled 2020-12-29 (×9): qty 1

## 2020-12-29 MED ORDER — MISOPROSTOL 200 MCG PO TABS
800.0000 ug | ORAL_TABLET | Freq: Once | ORAL | Status: AC
  Administered 2020-12-29: 800 ug via ORAL

## 2020-12-29 MED ORDER — OXYTOCIN-SODIUM CHLORIDE 30-0.9 UT/500ML-% IV SOLN
1.0000 m[IU]/min | INTRAVENOUS | Status: DC
Start: 2020-12-29 — End: 2020-12-29
  Administered 2020-12-29: 2 m[IU]/min via INTRAVENOUS

## 2020-12-29 NOTE — Lactation Note (Signed)
This note was copied from a baby's chart. Lactation Consultation Note  Patient Name: Lindsey Sellers [id number] Date: 12/29/2020   Age:37 hours  Returned to room to assist with latch.  Mother became nauseous and LC unable to help mother. Lactation will follow up on MBU.   Lindsey Sellers 12/29/2020, 10:05 AM

## 2020-12-29 NOTE — Progress Notes (Signed)
Labor Progress Note Lindsey Sellers is a 35 y.o. G3P1011 at 32w3dwho presented for IOL due to gHTN.  S: Resting comfortably. Reports increased pain/cramping with Cytotec. No other concerns at this time.   O:  BP (!) 132/99   Pulse 86   Temp 97.9 F (36.6 C) (Oral)   Resp 16   Ht '5\' 4"'$  (1.626 m)   Wt 85 kg   LMP 04/11/2020 (Exact Date)   BMI 32.15 kg/m  EFM: Baseline 125 bpm/moderate variability/+accels/-decels  CVE: Dilation: 4.5 Effacement (%): 60 Station: -2 Presentation: Vertex Exam by:: Dr. AGwenlyn Perking  A&P: 35y.o. GKU:[id number]#Labor: Progressing well since last check. Fetal head well-applied to cervix. AROM performed with clear fluid at 0521. Mom and baby tolerated this well. Will start Pitocin 2x2 and reassess in 3-4 hours.  #Pain: PRN; planning for epidural #FWB: Cat 1  #GBS negative  gHTN: BP remains normal to mild range. Asymptomatic. CBC with normal platelets. Normal LFTs. Awaiting urine PCR. Continue to monitor.    Hx of PPH: Plan for TXA at delivery    Anti-K/Anti-C antibody: Followed by MFM. Normal MCA dopplers with BPP 10/10 on 9/6.   ChGenia DelMD 5:37 AM

## 2020-12-29 NOTE — Lactation Note (Signed)
This note was copied from a baby's chart. Lactation Consultation Note  Patient Name: Lindsey Sellers [id number] Date: 12/29/2020 Reason for consult: Initial assessment;Mother's request;Early term 37-38.6wks Age:37 hours  LC assisted with getting depth with her latch and placing infant STS. Mom stated pain resolved with changes made.   Mom taught how to do hand expression and spoon fed colostrum 3 ml given  Plans 1. To feed based on cues 8-12x  24 hr period.  2. If unable to latch, Mom to offer EBM via spoon before latching.  3. Mom to compress breasts and note signs of milk transfer 4. I and O sheet reviewed.  5. [place] brochure of inpatient and outpatient services reviewed.  All questions answered at the end of the visit.   Maternal Data Has patient been taught Hand Expression?: Yes Does the patient have breastfeeding experience prior to this delivery?: Yes How long did the patient breastfeed?: 9 months supplemented with breast and formula following 2 PPH with first child  Feeding Mother's Current Feeding Choice: Breast Milk  LATCH Score Latch: Repeated attempts needed to sustain latch, nipple held in mouth throughout feeding, stimulation needed to elicit sucking reflex.  Audible Swallowing: Spontaneous and intermittent  Type of Nipple: Everted at rest and after stimulation  Comfort (Breast/Nipple): Filling, red/small blisters or bruises, mild/mod discomfort  Hold (Positioning): Assistance needed to correctly position infant at breast and maintain latch.  LATCH Score: 7   Lactation Tools Discussed/Used    Interventions Interventions: Breast feeding basics reviewed;Breast compression;Assisted with latch;Adjust position;Skin to skin;Support pillows;Breast massage;Position options;Hand express;Expressed milk;Education  Discharge Pump: Personal  Consult Status Consult Status: Follow-up Date: 12/30/20 Follow-up type: In-patient    Ahna Konkle  Nicholson-Springer 12/29/2020,  1:53 PM

## 2020-12-29 NOTE — H&P (Signed)
OBSTETRIC ADMISSION HISTORY AND PHYSICAL  Lindsey Sellers is a 35 y.o. female G7P1011 with IUP at 57w3dby LMP presenting for IOL for gestational HTN. She reports +FMs, no LOF, no VB, no blurry vision, headaches, peripheral edema, or RUQ pain.  She plans on breast feeding. She requests POPs for birth control until partner vasectomy.   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   HPV changes only    Past Surgical History: Past Surgical History:  Procedure Laterality Date   child birth  03/05/2018   COLPOSCOPY  2008   no biopsy done   DILATION AND CURETTAGE OF UTERUS  09/2020   WISDOM TOOTH EXTRACTION      Obstetrical History: OB History     Gravida  3   Para  1   Term  1   Preterm  0   AB  1   Living  1      SAB  0   IAB  1   Ectopic  0   Multiple  0   Live Births  1           Social History Social History   Socioeconomic History   Marital status: Married    Spouse name: Not on file   Number of children: Not on file   Years of education: Not on file   Highest education level: Not on file  Occupational History   Not on file  Tobacco Use   Smoking status: Never   Smokeless tobacco: Never  Vaping Use   Vaping Use: Never used  Substance and Sexual Activity   Alcohol use: Not Currently    Alcohol/week: 5.0 standard drinks    Types: 2 Glasses of wine, 3 Standard drinks or equivalent per week   Drug use: No   Sexual activity: Yes    Partners: Male    Birth control/protection: None  Other Topics  Concern   Not on file  Social History Narrative   Married -- [place]   Team lead for registration at [employer]   Completed graduate degree   Enjoys reading   Has 3 cats and a dog   Social Determinants of HRadio broadcast assistantStrain: Not on file  Food Insecurity: Not on file  Transportation Needs: Not on file  Physical Activity: Not on file  Stress: Not on file  Social Connections: Not on file    Family History: Family History  Problem Relation Age of Onset   Cancer Paternal Grandmother        lymph node   Gout Father    Colon cancer Neg Hx    Breast cancer Neg Hx     Allergies: No Known Allergies  Medications Prior to Admission  Medication Sig Dispense Refill Last Dose   Prenatal Vit-Fe Fumarate-FA (PRENATAL VITAMIN PO) Take by mouth.        Review of Systems  All systems reviewed and negative except as stated in HPI  Blood pressure 140/88, pulse 79, temperature  97.9 F (36.6 C), temperature source Oral, resp. rate 16, height '5\' 4"'$  (1.626 m), weight 85 kg, last menstrual period 04/11/2020.  General appearance: alert, cooperative, and no distress Lungs: normal work of breathing on room air  Heart: normal rate, warm and well-perfused  Abdomen: soft, non-tender, gravid  Extremities: no LE edema or calf tenderness to palpation   Presentation: cephalic Fetal monitoring: Baseline 125 bpm, moderate variability, + accels, no decels  Uterine activity: Irregular contractions  Dilation: 3.5 Effacement (%): 60 Station: -3 Exam by:: Rebeca Alert, RN   Prenatal labs: ABO, Rh: --/--/A POS (09/08 0022) Antibody: POS (09/08 0022) Rubella: 2.34 (03/03 1337) RPR: NON-REACTIVE (07/11 0807)  HBsAg: NON-REACTIVE (03/03 1337)  HIV: NON-REACTIVE (07/11 0807)  GBS:  Negative 12/12/2020  1 hr Glucola normal Genetic screening - low risk NIPS, AFP neg Anatomy US normal   Prenatal Transfer Tool  Maternal Diabetes:  No Genetic Screening: Normal Maternal Ultrasounds/Referrals: Normal Fetal Ultrasounds or other Referrals:  Referred to Materal Fetal Medicine  Maternal Substance Abuse:  No Significant Maternal Medications:  None Significant Maternal Lab Results: Group B Strep negative  Results for orders placed or performed during the hospital encounter of 12/29/20 (from the past 24 hour(s))  CBC   Collection Time: 12/29/20 12:22 AM  Result Value Ref Range   WBC 10.3 4.0 - 10.5 K/uL   RBC 4.26 3.87 - 5.11 MIL/uL   Hemoglobin 13.7 12.0 - 15.0 g/dL   HCT 40.6 36.0 - 46.0 %   MCV 95.3 80.0 - 100.0 fL   MCH 32.2 26.0 - 34.0 pg   MCHC 33.7 30.0 - 36.0 g/dL   RDW 13.0 11.5 - 15.5 %   Platelets 197 150 - 400 K/uL   nRBC 0.0 0.0 - 0.2 %  Type and screen   Collection Time: 12/29/20 12:22 AM  Result Value Ref Range   ABO/RH(D) A POS    Antibody Screen POS    Sample Expiration      01/01/2021,2359 Performed at [facility], [place] [address], [place], [place] [address]    Antibody Identification PENDING     Patient Active Problem List   Diagnosis Date Noted   Gestational hypertension 12/29/2020   Elevated BP without diagnosis of hypertension 12/19/2020   Red Chart Rounds Patient 12/09/2020   Maternal atypical antibody  (Anti- little c) complicating pregnancy [id number]   H/O postpartum hemorrhage, currently pregnant 06/23/2020   Trisomy 21, 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 G3P1011 at 43w3dhere for IOL due to gestation HTN.  #Labor: SVE 3.5/60/-3. Will start with Cytotec 50 mcg buccal and reassess in 4 hours. #Pain:  PRN #FWB:  Cat 1  #ID:   GBS  neg #MOF:  Breast #MOC:  POPs > partner vasectomy   GHTN: BP normal to mild range since admission. CBC, CMP, UPC ordered. Will continue to monitor.   Hx of PPH: Plan for TXA at delivery   Anti-K/Anti-C antibody: Followed by MFM. Normal MCA dopplers with BPP 10/10 on 9/6.   CGenia Del MD  12/29/2020, 3:13 AM

## 2020-12-29 NOTE — Anesthesia Postprocedure Evaluation (Signed)
Anesthesia Post Note  Patient: Lindsey Sellers  Procedure(s) Performed: AN AD [place]     Patient location during evaluation: Mother Baby Anesthesia Type: Epidural Level of consciousness: awake Pain management: satisfactory to patient Vital Signs Assessment: post-procedure vital signs reviewed and stable Respiratory status: spontaneous breathing Cardiovascular status: stable Anesthetic complications: no   No notable events documented.  Last Vitals:  Vitals:   12/29/20 1050 12/29/20 1215  BP: 119/64 120/65  Pulse: 87 63  Resp: 16 16  Temp:  36.9 C  SpO2: 98% 100%    Last Pain:  Vitals:   12/29/20 1215  TempSrc: Oral  PainSc: 2    Pain Goal:                   [employer]

## 2020-12-29 NOTE — Anesthesia Preprocedure Evaluation (Signed)
Anesthesia Evaluation  Patient identified by MRN, date of birth, ID band Patient awake    Reviewed: Allergy & Precautions, H&P , NPO status , Patient's Chart, lab work & pertinent test results, reviewed documented beta blocker date and time   Airway Mallampati: I  TM Distance: >3 FB Neck ROM: full    Dental no notable dental hx. (+) Teeth Intact, Dental Advisory Given   Pulmonary neg pulmonary ROS,    Pulmonary exam normal breath sounds clear to auscultation       Cardiovascular hypertension, Normal cardiovascular exam Rhythm:regular Rate:Normal     Neuro/Psych negative neurological ROS  negative psych ROS   GI/Hepatic negative GI ROS, Neg liver ROS,   Endo/Other  negative endocrine ROS  Renal/GU negative Renal ROS  negative genitourinary   Musculoskeletal   Abdominal   Peds  Hematology Anti- little c Kell isoimmunization during pregnancy   Anesthesia Other Findings   Reproductive/Obstetrics (+) Pregnancy                             Anesthesia Physical Anesthesia Plan  ASA: 3  Anesthesia Plan: Epidural   Post-op Pain Management:    Induction:   PONV Risk Score and Plan: Treatment may vary due to age or medical condition  Airway Management Planned: Natural Airway  Additional Equipment: None  Intra-op Plan:   Post-operative Plan:   Informed Consent: I have reviewed the patients History and Physical, chart, labs and discussed the procedure including the risks, benefits and alternatives for the proposed anesthesia with the patient or authorized representative who has indicated his/her understanding and acceptance.     Dental Advisory Given  Plan Discussed with: Anesthesiologist  Anesthesia Plan Comments: (Labs checked- platelets confirmed with RN in room. Fetal heart tracing, per RN, reported to be stable enough for sitting procedure. Discussed epidural, and patient consents  to the procedure:  included risk of possible headache,backache, failed block, allergic reaction, and nerve injury. This patient was asked if she had any questions or concerns before the procedure started.)        Anesthesia Quick Evaluation

## 2020-12-29 NOTE — Anesthesia Procedure Notes (Signed)
Epidural Patient location during procedure: OB Start time: 12/29/2020 6:24 AM End time: 12/29/2020 6:30 AM  Staffing Anesthesiologist: Janeece Riggers, MD  Preanesthetic Checklist Completed: patient identified, IV checked, site marked, risks and benefits discussed, surgical consent, monitors and equipment checked, pre-op evaluation and timeout performed  Epidural Patient position: sitting Prep: DuraPrep and site prepped and draped Patient monitoring: continuous pulse ox and blood pressure Approach: midline Location: L4-L5 Injection technique: LOR air  Needle:  Needle type: Tuohy  Needle gauge: 17 G Needle length: 9 cm and 9 Needle insertion depth: 6 cm Catheter type: closed end flexible Catheter size: 19 Gauge Catheter at skin depth: 11 cm Test dose: negative  Assessment Events: blood not aspirated, injection not painful, no injection resistance, no paresthesia and negative IV test

## 2020-12-29 NOTE — Discharge Summary (Signed)
Postpartum Discharge Summary  Patient Name: Lindsey Sellers DOB: 29-Oct-1985 MRN: [id number]  Date of admission: 12/29/2020 Delivery date:12/29/2020  Delivering provider: Julianne Handler  Date of discharge: 12/31/2020  Admitting diagnosis: Gestational hypertension [O13.9] Intrauterine pregnancy: [redacted]w[redacted]d    Secondary diagnosis:  Active Problems:   Gestational hypertension   SVD (spontaneous vaginal delivery)  Additional problems: Hx of PPH, gHTN, anti-little c antibody, Kell isoimmunization, AMA, hx previous T21 child     Discharge diagnosis: Term Pregnancy Delivered and Gestational Hypertension                                              Post partum procedures: None Augmentation: AROM, Pitocin, and Cytotec Complications: None  Hospital course: Induction of Labor With Vaginal Delivery   35y.o. yo G3P1011 at 358w3das admitted to the hospital 12/29/2020 for induction of labor.  Indication for induction: Gestational hypertension.  Patient had an uncomplicated labor course as follows: Membrane Rupture Time/Date: 5:26 AM ,12/29/2020   Delivery Method:Vaginal, Spontaneous  Episiotomy: None  Lacerations:  2nd degree;Perineal  Details of delivery can be found in separate delivery note.  Patient had a routine postpartum course. Patient is discharged home 12/31/20.  Newborn Data: Birth date:12/29/2020  Birth time:8:38 AM  Gender:Female  Living status:Living  Apgars:9 ,9  Weight:7 lb 5.8 oz (3.34 kg)   Magnesium Sulfate received: No BMZ received: No Rhophylac:N/A MMR:N/A T-DaP:Given prenatally Flu: Yes Transfusion:No  Physical exam  Vitals:   12/30/20 0500 12/30/20 1351 12/30/20 2135 12/31/20 0620  BP: 107/78 120/83 120/76 106/78  Pulse: 78 83 85 91  Resp: _0 Temp: 98 F (36.7 C) 98.2 F (36.8 C) 98.4 F (36.9 C) 98.2 F (36.8 C)  TempSrc: Oral Oral Oral Oral  SpO2: 99% 98%  99%  Weight:      Height:      All BPs appropriate, no anti-hypertensive indicated at  delivery. Strong precautions given for s/sx of pp PEC.  General: alert, cooperative, and no distress. Given venofer yesterday, feeling well today, no additional dizziness/lightheadedness.  Lochia: appropriate Uterine Fundus: firm, still having after pains, explained normalcy of this being worse in a subsequent postpartum period. Strong precautions given for s/sx of uterine infection given her history. Incision: N/A DVT Evaluation: No evidence of DVT seen on physical exam.  Labs: Lab Results  Component Value Date   WBC 8.8 12/30/2020   HGB 10.5 (L) 12/30/2020   HCT 30.1 (L) 12/30/2020   MCV 95.3 12/30/2020   PLT 147 (L) 12/30/2020   CMP Latest Ref Rng & Units 12/29/2020  Glucose 70 - 99 mg/dL 116(H)  BUN 6 - 20 mg/dL 6  Creatinine 0.44 - 1.00 mg/dL 0.71  Sodium 135 - 145 mmol/L 135  Potassium 3.5 - 5.1 mmol/L 3.5  Chloride 98 - 111 mmol/L 106  CO2 22 - 32 mmol/L 19(L)  Calcium 8.9 - 10.3 mg/dL 9.1  Total Protein 6.5 - 8.1 g/dL 6.3(L)  Total Bilirubin 0.3 - 1.2 mg/dL 0.3  Alkaline Phos 38 - 126 U/L 93  AST 15 - 41 U/L 21  ALT 0 - 44 U/L 14   Edinburgh Score: Edinburgh Postnatal Depression Scale Screening Tool 12/30/2020  I have been able to laugh and see the funny side of things. 0  I have looked forward with enjoyment to things. 0  I have blamed myself  unnecessarily when things went wrong. 1  I have been anxious or worried for no good reason. 2  I have felt scared or panicky for no good reason. 1  Things have been getting on top of me. 2  I have been so unhappy that I have had difficulty sleeping. 1  I have felt sad or miserable. 0  I have been so unhappy that I have been crying. 0  The thought of harming myself has occurred to me. 0  Edinburgh Postnatal Depression Scale Total 7   After visit meds:  Allergies as of 12/31/2020   No Known Allergies      Medication List     TAKE these medications    Drospirenone 4 MG Tabs Take 1 tablet by mouth daily at 6 (six) AM.    ibuprofen 600 MG tablet Commonly known as: ADVIL Take 1 tablet (600 mg total) by mouth every 6 (six) hours.   PRENATAL VITAMIN PO Take 1 tablet by mouth daily.       Discharge home in stable condition Infant Feeding: Bottle and Breast Infant Disposition:home with mother Discharge instruction: per After Visit Summary and Postpartum booklet. Activity: Advance as tolerated. Pelvic rest for 6 weeks.  Diet: routine diet  Future Appointments: Future Appointments  Date Time Provider D'Lo  01/05/2021 10:00 AM [facility] NURSE [facility]  02/10/2021  9:30 AM Julianne Handler, CNM [facility]   Follow up Visit: Please schedule this patient for a In person postpartum visit in 6 weeks with the following provider: Any provider. Additional Postpartum F/U:BP check 1 week  High risk pregnancy complicated by: HTN Delivery mode:  Vaginal, Spontaneous  Anticipated Birth Control:  POPs (Slynd prescribed at discharge)   12/31/2020 Gabriel Carina, CNM

## 2020-12-30 ENCOUNTER — Encounter: Payer: Self-pay | Admitting: *Deleted

## 2020-12-30 LAB — CBC
HCT: 30.1 % — ABNORMAL LOW (ref 36.0–46.0)
Hemoglobin: 10.5 g/dL — ABNORMAL LOW (ref 12.0–15.0)
MCH: 33.2 pg (ref 26.0–34.0)
MCHC: 34.9 g/dL (ref 30.0–36.0)
MCV: 95.3 fL (ref 80.0–100.0)
Platelets: 147 10*3/uL — ABNORMAL LOW (ref 150–400)
RBC: 3.16 MIL/uL — ABNORMAL LOW (ref 3.87–5.11)
RDW: 13.1 % (ref 11.5–15.5)
WBC: 8.8 10*3/uL (ref 4.0–10.5)
nRBC: 0 % (ref 0.0–0.2)

## 2020-12-30 MED ORDER — SODIUM CHLORIDE 0.9 % IV SOLN
INTRAVENOUS | Status: DC | PRN
  Administered 2020-12-30: 250 mL via INTRAVENOUS

## 2020-12-30 MED ORDER — SODIUM CHLORIDE 0.9 % IV SOLN
500.0000 mg | Freq: Once | INTRAVENOUS | Status: AC
  Administered 2020-12-30: 500 mg via INTRAVENOUS
  Filled 2020-12-30: qty 25

## 2020-12-30 NOTE — Lactation Note (Addendum)
This note was copied from a baby's chart. Lactation Consultation Note  Patient Name: Girl Wynelle Walzer [id number] Date: 12/30/2020 Reason for consult: Follow-up assessment;Mother's request;Difficult latch;Early term 37-38.6wks;Nipple pain/trauma Age:37 hours  Mom nipples really sore, red some bruising but no abrasions. [place] worked on different positions to get a deeper latch with audible swallows. However, level soreness only reduced to 4 or 5 with changes made. LC applied NS but infant still showing hunger cues after 10 min latch small reside noted in shield.  LC went over options of giving more volume through pumping, DBM or formula. Mom requested formula.   Plan 1. To feed based on cues 8-12x 24h period. Mom taking breast rest for now wearing breast shells using coconut oil and EBM for healing.  2. Mom to pace bottle feed EBM first followed by formula with extra slow flow nipple. BF supplementation guide provided. Mom aware to offer more if infant not latching. 3. DEBP q 3hrs for 15 min starting with next feeding. Mom to make a selection of [employer] employee pump wants. RN, Lucretia Roers alerted of plan above.   All questions answered at the end of the visit.   LC returned to take copy of insurance card and provide [employer] employee pump with receipt.   Maternal Data    Feeding Mother's Current Feeding Choice: Breast Milk and Formula  LATCH Score Latch: Repeated attempts needed to sustain latch, nipple held in mouth throughout feeding, stimulation needed to elicit sucking reflex.  Audible Swallowing: A few with stimulation  Type of Nipple: Everted at rest and after stimulation  Comfort (Breast/Nipple): Filling, red/small blisters or bruises, mild/mod discomfort  Hold (Positioning): Assistance needed to correctly position infant at breast and maintain latch.  LATCH Score: 6   Lactation Tools Discussed/Used Tools: Pump;Flanges;Shells Breast pump type: Other (comment) (MOm [employer] employee.  Breast rest for now, call for employee pump start pumping to increase stimulation and let down. RN Lucretia Roers aware Mom need to start pumping with next feeding.) Reason for Pumping: increase stimulation Pumping frequency: every 3 hrs for 15 min  Interventions Interventions: Breast feeding basics reviewed;Breast compression;Assisted with latch;Adjust position;Comfort gels;Skin to skin;Support pillows;DEBP;Breast massage;Position options;Hand express;Expressed milk;Education;Coconut oil;Pace feeding;Shells  Discharge    Consult Status Consult Status: Follow-up Date: 12/31/20 Follow-up type: In-patient    Xsavier Seeley  N[place]olson[place]Springer 12/30/2020, 8:06 PM

## 2020-12-30 NOTE — Progress Notes (Addendum)
POSTPARTUM PROGRESS NOTE  Post Partum Day 1  Subjective:  Lindsey Sellers is a 35 y.o. [id number] s/p SVD at [redacted]w[redacted]d  No acute events overnight.  Pt denies problems with ambulating, voiding or po intake.  She denies nausea or vomiting.  Pain is well controlled. She has some lower abdominal cramping. She has had flatus. She has had bowel movement.  Lochia Moderate.   Objective: Blood pressure 107/78, pulse 78, temperature 98 F (36.7 C), temperature source Oral, resp. rate 17, height '5\' 4"'$  (1.626 m), weight 85 kg, last menstrual period 04/11/2020, SpO2 99 %, currently breastfeeding.  Physical Exam:  General: alert, cooperative and no distress Chest: no respiratory distress Heart: regular rate, distal pulses intact Abdomen: soft, nontender Uterine Fundus: firm, appropriately tender DVT Evaluation: No calf swelling or tenderness Extremities: No edema Skin: warm, dry  Recent Labs    12/29/20 0022 12/30/20 0455  HGB 13.7 10.5*  HCT 40.6 30.1*    Assessment/Plan: Lindsey Sellers a 35y.o. [id number]/p SVD at 348w3d PPD#1 - Doing well Contraception: POPs and spouse vasectomy Feeding: breast. Would like to speak with lactation again, consult placed Dispo: Plan for discharge tomorrow.   LOS: 1 day   Rising, [name]/12/2020, 7:42 AM    Attestation of Supervision of Student:  I confirm that I have verified the information documented in the physician assistant student's note and that I have also personally reperformed the history, physical exam and all medical decision making activities.  I have verified that all services and findings are accurately documented in this student's note; and I agree with management and plan as outlined in the documentation. I have also made any necessary editorial changes.  Discussed Hgb drop this am and risks and benefits of venofer. Patient desires iron transfusion while inpatient. Order placed  Lindsey MottCNWestchaseor  [employer] [place]/12/2020 8:37 AM

## 2020-12-31 MED ORDER — DROSPIRENONE 4 MG PO TABS: 1.0000 | ORAL_TABLET | Freq: Every day | ORAL | 11 refills | Status: DC

## 2020-12-31 MED ORDER — IBUPROFEN 600 MG PO TABS: 600.0000 mg | ORAL_TABLET | Freq: Four times a day (QID) | ORAL | 0 refills | Status: DC

## 2021-01-01 ENCOUNTER — Other Ambulatory Visit: Payer: Self-pay

## 2021-01-01 ENCOUNTER — Encounter (HOSPITAL_COMMUNITY): Payer: Self-pay | Admitting: Family Medicine

## 2021-01-01 ENCOUNTER — Inpatient Hospital Stay (HOSPITAL_COMMUNITY)
Admission: AD | Admit: 2021-01-01 | Discharge: 2021-01-03 | DRG: 776 | Disposition: A | Discharge status: Home / Self Care | Payer: No Typology Code available for payment source | Attending: Obstetrics and Gynecology | Admitting: Obstetrics and Gynecology

## 2021-01-01 DIAGNOSIS — O1495 Unspecified pre-eclampsia, complicating the puerperium: Principal | ICD-10-CM | POA: Diagnosis present

## 2021-01-01 DIAGNOSIS — Z20822 Contact with and (suspected) exposure to covid-19: Secondary | ICD-10-CM | POA: Diagnosis present

## 2021-01-01 DIAGNOSIS — R030 Elevated blood-pressure reading, without diagnosis of hypertension: Secondary | ICD-10-CM | POA: Diagnosis not present

## 2021-01-01 DIAGNOSIS — Z8759 Personal history of other complications of pregnancy, childbirth and the puerperium: Secondary | ICD-10-CM | POA: Diagnosis present

## 2021-01-01 LAB — COMPREHENSIVE METABOLIC PANEL
ALT: 32 U/L (ref 0–44)
AST: 36 U/L (ref 15–41)
Albumin: 3.1 g/dL — ABNORMAL LOW (ref 3.5–5.0)
Alkaline Phosphatase: 65 U/L (ref 38–126)
Anion gap: 10 (ref 5–15)
BUN: 8 mg/dL (ref 6–20)
CO2: 22 mmol/L (ref 22–32)
Calcium: 9.1 mg/dL (ref 8.9–10.3)
Chloride: 107 mmol/L (ref 98–111)
Creatinine, Ser: 0.59 mg/dL (ref 0.44–1.00)
GFR, Estimated: >60 mL/min (ref >60)
Glucose, Bld: 95 mg/dL (ref 70–99)
Potassium: 4.1 mmol/L (ref 3.5–5.1)
Sodium: 139 mmol/L (ref 135–145)
Total Bilirubin: 0.3 mg/dL (ref 0.3–1.2)
Total Protein: 6 g/dL — ABNORMAL LOW (ref 6.5–8.1)

## 2021-01-01 LAB — URINALYSIS, ROUTINE W REFLEX MICROSCOPIC
Bilirubin Urine: NEGATIVE
Glucose, UA: NEGATIVE mg/dL
Ketones, ur: NEGATIVE mg/dL
Nitrite: NEGATIVE
Protein, ur: NEGATIVE mg/dL
Specific Gravity, Urine: <1.005 — ABNORMAL LOW (ref 1.005–1.030)
pH: 6 (ref 5.0–8.0)

## 2021-01-01 LAB — RESP PANEL BY RT-PCR (FLU A&B, COVID) ARPGX2
Influenza A by PCR: NEGATIVE
Influenza B by PCR: NEGATIVE
SARS Coronavirus 2 by RT PCR: NEGATIVE

## 2021-01-01 LAB — PROTEIN / CREATININE RATIO, URINE
Creatinine, Urine: 41.49 mg/dL
Protein Creatinine Ratio: 0.14 mg/mg{Cre} (ref 0.00–0.15)
Total Protein, Urine: 6 mg/dL

## 2021-01-01 LAB — CBC
HCT: 35 % — ABNORMAL LOW (ref 36.0–46.0)
Hemoglobin: 11.8 g/dL — ABNORMAL LOW (ref 12.0–15.0)
MCH: 33.1 pg (ref 26.0–34.0)
MCHC: 33.7 g/dL (ref 30.0–36.0)
MCV: 98 fL (ref 80.0–100.0)
Platelets: 223 10*3/uL (ref 150–400)
RBC: 3.57 MIL/uL — ABNORMAL LOW (ref 3.87–5.11)
RDW: 12.9 % (ref 11.5–15.5)
WBC: 7.9 10*3/uL (ref 4.0–10.5)
nRBC: 0 % (ref 0.0–0.2)

## 2021-01-01 LAB — URINALYSIS, MICROSCOPIC (REFLEX)

## 2021-01-01 MED ORDER — LABETALOL HCL 5 MG/ML IV SOLN: 40.0000 mg | INTRAVENOUS | Status: DC | PRN

## 2021-01-01 MED ORDER — LABETALOL HCL 5 MG/ML IV SOLN: 80.0000 mg | INTRAVENOUS | Status: DC | PRN

## 2021-01-01 MED ORDER — ENOXAPARIN SODIUM 40 MG/0.4ML IJ SOSY
40.0000 mg | PREFILLED_SYRINGE | INTRAMUSCULAR | Status: DC
  Administered 2021-01-01 – 2021-01-02 (×2): 40 mg via SUBCUTANEOUS
  Filled 2021-01-01 (×2): qty 0.4

## 2021-01-01 MED ORDER — HYDRALAZINE HCL 20 MG/ML IJ SOLN: 10.0000 mg | INTRAMUSCULAR | Status: DC | PRN

## 2021-01-01 MED ORDER — LABETALOL HCL 5 MG/ML IV SOLN: 20.0000 mg | INTRAVENOUS | Status: DC | PRN

## 2021-01-01 MED ORDER — NIFEDIPINE ER OSMOTIC RELEASE 30 MG PO TB24
30.0000 mg | ORAL_TABLET | Freq: Two times a day (BID) | ORAL | Status: DC
  Administered 2021-01-01 – 2021-01-03 (×4): 30 mg via ORAL
  Filled 2021-01-01 (×4): qty 1

## 2021-01-01 MED ORDER — SODIUM CHLORIDE 0.9% FLUSH: 3.0000 mL | INTRAVENOUS | Status: DC | PRN

## 2021-01-01 MED ORDER — FUROSEMIDE 40 MG PO TABS
20.0000 mg | ORAL_TABLET | Freq: Two times a day (BID) | ORAL | Status: AC
  Administered 2021-01-01 – 2021-01-02 (×3): 20 mg via ORAL
  Filled 2021-01-01 (×3): qty 1

## 2021-01-01 MED ORDER — MAGNESIUM SULFATE 40 GM/1000ML IV SOLN
2.0000 g/h | INTRAVENOUS | Status: AC
  Administered 2021-01-01 – 2021-01-02 (×2): 2 g/h via INTRAVENOUS
  Filled 2021-01-01 (×2): qty 1000

## 2021-01-01 MED ORDER — LACTATED RINGERS IV SOLN: INTRAVENOUS | Status: DC

## 2021-01-01 MED ORDER — LABETALOL HCL 5 MG/ML IV SOLN
INTRAVENOUS | Status: AC
  Administered 2021-01-01: 20 mg via INTRAVENOUS
  Filled 2021-01-01: qty 4

## 2021-01-01 MED ORDER — MAGNESIUM SULFATE BOLUS VIA INFUSION
4.0000 g | Freq: Once | INTRAVENOUS | Status: AC
  Administered 2021-01-01: 4 g via INTRAVENOUS
  Filled 2021-01-01: qty 1000

## 2021-01-01 MED ORDER — SODIUM CHLORIDE 0.9% FLUSH
3.0000 mL | Freq: Two times a day (BID) | INTRAVENOUS | Status: DC
  Administered 2021-01-02: 3 mL via INTRAVENOUS

## 2021-01-01 MED ORDER — SODIUM CHLORIDE 0.9 % IV SOLN: 250.0000 mL | INTRAVENOUS | Status: DC | PRN

## 2021-01-01 NOTE — MAU Note (Signed)
Patient arrived to MAU from home for complaint of headache, blurred vision, back pain, and elevated blood pressure at home of 155/105. Patient is postpartum, 12/29/20 she delievered. She stated that the headache pain is 5/10.

## 2021-01-01 NOTE — H&P (Signed)
Lindsey Sellers is a 35 y.o. female presenting for elevated blood pressure, severe HA and seeing black spots intermittently in vision. Patient delivered 12/29/2020 and was discharged home yesterday around 1030AM with normal pressures. Patient with diagnosis of gHTN during pregnancy. Patient states she was feeling very well this morning, but then she slowly started to feel worse as the afternoon started. Patient reports NKDA and is not currently taking any medications other than PNV. No history of asthma. Severe range blood pressures on admission to MAU, labetalol protocol given and patient admitted to OBS at that time.  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 Social History:  reports that she has never smoked. She has never used smokeless tobacco. She reports that she does not currently use alcohol after a past usage of about 5.0 standard drinks per week. She reports that she does not use drugs.    Maternal Diabetes: No Genetic Screening: Normal Maternal Ultrasounds/Referrals: Other: AMA, RBC antibody positive, Kell isoimmunization, Anti-little c antibodies, gHTN Fetal Ultrasounds or other Referrals:  Referred to Materal Fetal Medicine  Maternal Substance Abuse:  No Significant Maternal Medications:  None Significant Maternal Lab Results:  Other:  antibody positive, Kell isoimmunization, Anti-little c antibodies Other Comments:   None  Review of Systems  Constitutional:  Negative for chills, diaphoresis, fatigue and fever.  Eyes:  Positive for visual disturbance.  Respiratory:  Negative for shortness of breath.   Cardiovascular:  Negative for chest pain.  Gastrointestinal:  Negative for abdominal pain, constipation, diarrhea, nausea and vomiting.  Genitourinary:  Negative for dysuria, flank pain, frequency, pelvic pain, urgency, vaginal bleeding and vaginal discharge.  Neurological:  Positive for headaches. Negative for dizziness, weakness and light-headedness.  Maternal Medical History:  Reason for admission: Nausea.     Blood pressure 132/78, pulse 76, temperature 98.8 F (37.1 C), temperature source Oral, resp. rate 16, SpO2 97 %, unknown if currently breastfeeding.  Patient Vitals for the past 24 hrs:  BP Temp Temp src Pulse Resp SpO2  01/01/21 1631 132/78 -- -- 76 -- --  01/01/21 1630 -- -- -- -- -- 97 %  01/01/21 1625 -- -- -- -- -- 99 %  01/01/21 1620 -- -- -- -- -- 98 %  01/01/21 1616 (!) 158/87 -- -- 84 -- --  01/01/21 1615 -- -- -- -- -- 98 %  01/01/21 1610 -- -- -- -- -- 99 %  01/01/21 1608 (!) 146/90 -- -- 96 -- --  01/01/21 1555 -- -- -- -- -- 100 %  01/01/21 1546 (!) 162/94 -- -- (!) 104 -- --  01/01/21 1545 -- -- -- -- -- 99 %  01/01/21 1540 -- -- -- -- -- 100 %  01/01/21 1535 -- -- -- -- -- 99 %  01/01/21 1531 (!) 161/93 -- -- (!) 118 -- --  01/01/21 1530 -- -- -- -- -- 100 %  01/01/21 1526 (!) 161/102 98.8 F (37.1 C) Oral (!) 107 16 100 %   Exam Physical Exam Vitals and nursing note reviewed.  Constitutional:      General: She is not in acute distress.    Appearance: Normal appearance. She is not ill-appearing, toxic-appearing or diaphoretic.  HENT:     Head: Normocephalic and atraumatic.  Pulmonary:     Effort: Pulmonary effort is normal.  Neurological:     Mental Status: She is alert and oriented to person, place, and time.  Psychiatric:        Mood and Affect: Mood  normal.        Behavior: Behavior normal.        Thought Content: Thought content normal.        Judgment: Judgment normal.    Prenatal labs: ABO, Rh: --/--/A POS (09/08 0022) Antibody: POS (09/08 0022) Rubella: 2.34 (03/03 1337) RPR: NON REACTIVE (09/08 0022)  HBsAg: NON-REACTIVE (03/03 1337)  HIV: NON-REACTIVE (07/11 0807)  GBS:  negative`  Assessment/Plan: 1. Pre-eclampsia in postpartum period    -admit to OBS  Elmyra Ricks E Nykayla Marcelli 01/01/2021, 4:35 PM

## 2021-01-02 DIAGNOSIS — O1495 Unspecified pre-eclampsia, complicating the puerperium: Principal | ICD-10-CM

## 2021-01-02 LAB — TYPE AND SCREEN
ABO/RH(D): A POS
Antibody Screen: POSITIVE
Donor AG Type: NEGATIVE
Donor AG Type: NEGATIVE
Unit division: 0
Unit division: 0

## 2021-01-02 LAB — BPAM RBC
Blood Product Expiration Date: 202210122359
Blood Product Expiration Date: 202210122359
Unit Type and Rh: 6200
Unit Type and Rh: 6200

## 2021-01-02 MED ORDER — SIMETHICONE 80 MG PO CHEW
80.0000 mg | CHEWABLE_TABLET | Freq: Four times a day (QID) | ORAL | Status: DC | PRN
  Administered 2021-01-02: 80 mg via ORAL
  Filled 2021-01-02: qty 1

## 2021-01-02 MED ORDER — SENNA 8.6 MG PO TABS
1.0000 | ORAL_TABLET | Freq: Once | ORAL | Status: AC
  Administered 2021-01-02: 8.6 mg via ORAL
  Filled 2021-01-02: qty 1

## 2021-01-02 NOTE — Progress Notes (Signed)
Patient ID: Lindsey Sellers, female   DOB: 12-22-1985, 35 y.o.   MRN: [id number] HD # 1   PPD # 4, PP SPEC  Pt without complaints this morning. Denies HA or blurry vision.  Tolerating diet  PE AF VSS Lungs clear Heart RRR Abd soft, BS, U-3 Ext non tender  A/P Stable. Magnesium x 24 hrs. BP stable on Procardia. Lasix x 1 more dose. Hopefully for discharge home tomorrow.

## 2021-01-02 NOTE — Plan of Care (Signed)
  Problem: Education: Goal: Knowledge of the prescribed therapeutic regimen will improve Outcome: Completed/Met   Problem: Education: Goal: Knowledge of General Education information will improve Description: Including pain rating scale, medication(s)/side effects and non-pharmacologic comfort measures Outcome: Completed/Met   Problem: Activity: Goal: Risk for activity intolerance will decrease Outcome: Completed/Met   Problem: Nutrition: Goal: Adequate nutrition will be maintained Outcome: Completed/Met   Problem: Coping: Goal: Level of anxiety will decrease Outcome: Completed/Met   Problem: Elimination: Goal: Will not experience complications related to urinary retention Outcome: Completed/Met

## 2021-01-03 ENCOUNTER — Other Ambulatory Visit (HOSPITAL_COMMUNITY): Payer: Self-pay

## 2021-01-03 DIAGNOSIS — O1495 Unspecified pre-eclampsia, complicating the puerperium: Secondary | ICD-10-CM | POA: Diagnosis not present

## 2021-01-03 MED ORDER — NIFEDIPINE ER 30 MG PO TB24
30.0000 mg | ORAL_TABLET | Freq: Two times a day (BID) | ORAL | 1 refills | Status: DC
  Filled 2021-01-03: qty 30, 15d supply, fill #0
  Filled 2021-01-17: qty 30, 15d supply, fill #1
  Filled 2021-01-18: qty 30, 15d supply, fill #0

## 2021-01-03 NOTE — Progress Notes (Signed)
Discharge instructions given to patient. Reviewed postpartum care, discussed hypertensive signs and symptoms, follow up care, and medications.

## 2021-01-03 NOTE — Discharge Summary (Signed)
Physician Discharge Summary  Patient ID: Lindsey Sellers MRN: [id number] DOB/AGE: [birth year] 35 y.o.  Admit date: 01/01/2021 Discharge date: 01/03/2021  Admission Diagnoses: Postpartum Hypertension  Discharge Diagnoses:  Active Problems:   Pre-eclampsia in postpartum period   Discharged Condition: good  Hospital Course: Pt was admitted with above Dx. Received magnesium x 24 hours and was started on Procardia daily. BP responded well. Denies HA, visual changes or RUQ pain. Felt amendable for discharge home. Discharge instructions, medications and follow up reviewed with pt. Pt verbalized understanding  Consults: None  Significant Diagnostic Studies: labs  Treatments: Magnesium  Discharge Exam: Blood pressure 128/89, pulse 78, temperature 98 F (36.7 C), temperature source Oral, resp. rate 18, height '5\' 4"'$  (1.626 m), weight 77.9 kg, SpO2 100 %, unknown if currently breastfeeding.  Lungs clear Heart RRR Abd soft + BS U-3 Ext non tender  Disposition: Discharge disposition: 01-Home or Self Care       Discharge Instructions     Activity as tolerated   Complete by: As directed    Call MD for:   Complete by: As directed    BP > 140/90   Call MD for:  difficulty breathing, headache or visual disturbances   Complete by: As directed    Call MD for:  extreme fatigue   Complete by: As directed    Call MD for:  hives   Complete by: As directed    Call MD for:  persistant dizziness or light-headedness   Complete by: As directed    Call MD for:  persistant nausea and vomiting   Complete by: As directed    Call MD for:  redness, tenderness, or signs of infection (pain, swelling, redness, odor or green/yellow discharge around incision site)   Complete by: As directed    Call MD for:  severe uncontrolled pain   Complete by: As directed    Call MD for:  temperature >100.4   Complete by: As directed    Diet - low sodium heart healthy   Complete by: As directed    Sexual  activity   Complete by: As directed    Pelvic rest x 4 weeks      Allergies as of 01/03/2021   No Known Allergies      Medication List     TAKE these medications    Drospirenone 4 MG Tabs Take 1 tablet by mouth daily at 6 (six) AM.   ibuprofen 600 MG tablet Commonly known as: ADVIL Take 1 tablet (600 mg total) by mouth every 6 (six) hours.   NIFEdipine 30 MG 24 hr tablet Commonly known as: ADALAT CC Take 1 tablet (30 mg total) by mouth 2 (two) times daily.   PRENATAL VITAMIN PO Take 1 tablet by mouth daily.        Follow-up [facility] Follow up.   Specialty: Obstetrics and Gynecology Why: Pt already has f/u appts Contact information: [place], [place] [phone]                Signed: Chancy Milroy 01/03/2021, 11:17 AM

## 2021-01-05 ENCOUNTER — Ambulatory Visit (INDEPENDENT_AMBULATORY_CARE_PROVIDER_SITE_OTHER): Payer: No Typology Code available for payment source | Admitting: Obstetrics and Gynecology

## 2021-01-05 ENCOUNTER — Other Ambulatory Visit: Payer: Self-pay

## 2021-01-05 VITALS — BP 149/87 | HR 98 | Wt 168.0 lb

## 2021-01-05 DIAGNOSIS — O1495 Unspecified pre-eclampsia, complicating the puerperium: Secondary | ICD-10-CM

## 2021-01-05 NOTE — Progress Notes (Signed)
   GYNECOLOGY OFFICE VISIT NOTE  History:   Lindsey Sellers is a 35 y.o. [phone] here today for blood pressure check. She denies any abnormal vaginal discharge, bleeding, pelvic pain or other concerns.      The following portions of the patient's history were reviewed and updated as appropriate: allergies, current medications, past family history, past medical history, past social history, past surgical history and problem list.     Review of Systems:  Pertinent items noted in HPI and remainder of comprehensive ROS otherwise negative.  Physical Exam:  BP (!) 149/87   Pulse 98   Wt 168 lb (76.2 kg)   BMI 28.84 kg/m  CONSTITUTIONAL: Well-developed, well-nourished female in no acute distress.  HEENT:  Normocephalic, atraumatic. External right and left ear normal. No scleral icterus.  NECK: Normal range of motion, supple, no masses noted on observation SKIN: No rash noted. Not diaphoretic. No erythema. No pallor. MUSCULOSKELETAL: Normal range of motion. No edema noted. NEUROLOGIC: Alert and oriented to person, place, and time. Normal muscle tone coordination. No cranial nerve deficit noted. PSYCHIATRIC: Normal mood and affect. Normal behavior. Normal judgment and thought content.     Assessment and Plan:  Postpartum preeclampsia -  - Blood pressure controlled on Procardia 30 BID - Continue Bps weekly at home. Encouraged to put them in babyscripts.  - Discussed s/sx that are concerning I.e. HA/BV/RUQ pain.  - f/u as planned for postpartum visit  Routine preventative health maintenance measures emphasized. Please refer to After Visit Summary for other counseling recommendations.     Radene Gunning, MD, [place] for [facility], [place]

## 2021-01-06 ENCOUNTER — Encounter (HOSPITAL_COMMUNITY): Payer: Self-pay | Admitting: *Deleted

## 2021-01-11 ENCOUNTER — Telehealth (HOSPITAL_COMMUNITY): Payer: Self-pay

## 2021-01-11 NOTE — Telephone Encounter (Signed)
No answer. Left message to return nurse call.  Sharyn Lull [facility] 01/11/2021,1954

## 2021-01-17 ENCOUNTER — Other Ambulatory Visit (HOSPITAL_COMMUNITY): Payer: Self-pay

## 2021-01-18 ENCOUNTER — Other Ambulatory Visit (HOSPITAL_COMMUNITY): Payer: Self-pay

## 2021-01-31 ENCOUNTER — Other Ambulatory Visit (HOSPITAL_COMMUNITY): Payer: Self-pay

## 2021-01-31 ENCOUNTER — Other Ambulatory Visit: Payer: Self-pay | Admitting: *Deleted

## 2021-01-31 MED ORDER — NIFEDIPINE ER 30 MG PO TB24
30.0000 mg | ORAL_TABLET | Freq: Two times a day (BID) | ORAL | 1 refills | Status: DC
  Filled 2021-01-31: qty 60, 30d supply, fill #0

## 2021-02-02 NOTE — Progress Notes (Signed)
[place] Partum Visit Note  Lindsey Sellers is a 35 y.o. [phone] female who presents for a postpartum visit. She is 5 weeks postpartum following an IOL for gHtn  vaginal delivery.  I have fully reviewed the prenatal and intrapartum course. The delivery was at 37/3 gestational weeks.  Anesthesia: epidural. Postpartum course has been remarkable with PP preeclampsia control with meds.Baby is doing well.Randel Books is feeding by breast. Bleeding no bleeding. Bowel function is normal. Bladder function is normal. Patient is not sexually active. Contraception method is oral progesterone-only contraceptive. Postpartum depression screening: negative.  Taking BP medication 7:30 am/7:30 pm. She reports flushing of the face, tachycardiac, Headache, clammy feeling 3-4 hours after taking it.   The pregnancy intention screening data noted above was reviewed. Potential methods of contraception were discussed. The patient elected to proceed with No data recorded.   Edinburgh Postnatal Depression Scale - 02/03/21 1021       Edinburgh Postnatal Depression Scale:  In the Past 7 Days   I have been able to laugh and see the funny side of things. 0    I have looked forward with enjoyment to things. 0    I have blamed myself unnecessarily when things went wrong. 0    I have been anxious or worried for no good reason. 0    I have felt scared or panicky for no good reason. 0    Things have been getting on top of me. 0    I have been so unhappy that I have had difficulty sleeping. 0    I have felt sad or miserable. 0    I have been so unhappy that I have been crying. 0    The thought of harming myself has occurred to me. 0    Edinburgh Postnatal Depression Scale Total 0             Health Maintenance Due  Topic Date Due   COVID-19 Vaccine (1) Never done   INFLUENZA VACCINE  11/21/2020    The following portions of the patient's history were reviewed and updated as appropriate: allergies, current  medications, past family history, past medical history, past social history, past surgical history, and problem list.  Review of Systems Behavioral/Psych: positive for depression  Objective:  BP (!) 149/91   Pulse 92   Resp 16   Ht 5\' 4"  (1.626 m)   Wt 171 lb (77.6 kg)   LMP 04/11/2020 (Exact Date)   BMI 29.35 kg/m    General:  alert, cooperative, and fatigued   Breasts:  not indicated  Lungs: clear to auscultation bilaterally  Heart:  regular rate and rhythm, S1, S2 normal, no murmur, click, rub or gallop       Assessment:   -PP hypertension: BP recheck 125/90  -Normal postpartum exam.   Plan:   Essential components of care per ACOG recommendations:  1.  Mood and well being: Patient with positive depression screening today. Reviewed local resources for support.  - Patient tobacco use? No.   - hx of drug use? No.    2. Infant care and feeding:  -Patient currently breastmilk feeding? Yes. Reviewed importance of draining breast regularly to support lactation.  -Social determinants of health (SDOH) reviewed in EPIC. No concerns 3. Sexuality, contraception and birth spacing - Patient does not want a pregnancy in the next year.  Desired family size is 2 children.  - Reviewed forms of contraception in tiered fashion. Patient desired abstinence today.   -  Discussed birth spacing of 18 months  4. Sleep and fatigue -Encouraged family/partner/community support of 4 hrs of uninterrupted sleep to help with mood and fatigue  5. Physical Recovery  - Discussed patients delivery and complications. She describes her labor as good. - Patient had a Vaginal, no problems at delivery. Patient had a 2nd degree laceration. Perineal healing reviewed. Patient expressed understanding - Patient has urinary incontinence? No. - Patient is safe to resume physical and sexual activity  6.  Health Maintenance - HM due items addressed Yes - Last pap smear  Diagnosis  Date Value Ref Range Status   08/04/2019   Final   - Negative for intraepithelial lesion or malignancy (NILM)   Pap smear done at today's visit.  -Breast Cancer screening indicated? No.   7. Chronic Disease/Pregnancy Condition follow up: Hypertension and Gestational Diabetes  - PCP follow up - Ok to stop procardia given the symptoms she has been experiencing, as stated above. Continue to monitor BP at home. If remains elevated she should f/u with PCP.   Noni Saupe, NP [facility] for [facility], [place]

## 2021-02-03 ENCOUNTER — Ambulatory Visit (INDEPENDENT_AMBULATORY_CARE_PROVIDER_SITE_OTHER): Payer: No Typology Code available for payment source | Admitting: Obstetrics and Gynecology

## 2021-02-03 ENCOUNTER — Encounter: Payer: Self-pay | Admitting: Obstetrics and Gynecology

## 2021-02-03 ENCOUNTER — Other Ambulatory Visit: Payer: Self-pay

## 2021-02-03 ENCOUNTER — Other Ambulatory Visit (HOSPITAL_COMMUNITY): Payer: Self-pay

## 2021-02-03 DIAGNOSIS — O139 Gestational [pregnancy-induced] hypertension without significant proteinuria, unspecified trimester: Secondary | ICD-10-CM

## 2021-02-03 MED ORDER — SERTRALINE HCL 50 MG PO TABS
25.0000 mg | ORAL_TABLET | Freq: Every day | ORAL | 1 refills | Status: DC
  Filled 2021-02-03: qty 30, 30d supply, fill #0

## 2021-02-10 ENCOUNTER — Ambulatory Visit: Payer: No Typology Code available for payment source | Admitting: Certified Nurse Midwife

## 2021-06-03 ENCOUNTER — Emergency Department: Admit: 2021-06-03 | Payer: Self-pay

## 2021-06-03 ENCOUNTER — Encounter: Payer: Self-pay | Admitting: Emergency Medicine

## 2021-06-03 ENCOUNTER — Other Ambulatory Visit (HOSPITAL_COMMUNITY): Payer: Self-pay

## 2021-06-03 ENCOUNTER — Emergency Department
Admission: EM | Admit: 2021-06-03 | Discharge: 2021-06-03 | Disposition: A | Discharge status: Home / Self Care | Payer: No Typology Code available for payment source | Source: Home / Self Care

## 2021-06-03 ENCOUNTER — Other Ambulatory Visit: Payer: Self-pay

## 2021-06-03 DIAGNOSIS — J029 Acute pharyngitis, unspecified: Secondary | ICD-10-CM

## 2021-06-03 DIAGNOSIS — J309 Allergic rhinitis, unspecified: Secondary | ICD-10-CM

## 2021-06-03 DIAGNOSIS — J0100 Acute maxillary sinusitis, unspecified: Secondary | ICD-10-CM

## 2021-06-03 LAB — POCT RAPID STREP A (OFFICE): Rapid Strep A Screen: NEGATIVE

## 2021-06-03 MED ORDER — FEXOFENADINE HCL 180 MG PO TABS
180.0000 mg | ORAL_TABLET | Freq: Every day | ORAL | 0 refills | Status: DC
  Filled 2021-06-03: qty 30, 30d supply, fill #0

## 2021-06-03 MED ORDER — AMOXICILLIN-POT CLAVULANATE 875-125 MG PO TABS
1.0000 | ORAL_TABLET | Freq: Two times a day (BID) | ORAL | 0 refills | Status: DC
  Filled 2021-06-03: qty 14, 7d supply, fill #0

## 2021-06-03 NOTE — ED Triage Notes (Signed)
Pt c/o sore throat and cough x4 days. States her child dx with strep this am.

## 2021-06-03 NOTE — Discharge Instructions (Addendum)
Advised patient to take medication as directed with food to completion.  Advised patient to take Allegra with first dose of Augmentin for the next 5 of 7 days.  Advised may use Allegra afterwards as needed for concurrent postnasal drainage/drip.  Encouraged patient to increase daily water intake while taking this medication.

## 2021-06-03 NOTE — ED Provider Notes (Signed)
[facility]    CSN: [id number] Arrival date & time: 06/03/21  1329      History   Chief Complaint Chief Complaint  Patient presents with   Sore Throat    HPI Lindsey Sellers is a 36 y.o. female.   HPI 36 year old female presents with sore throat and cough for 4 days.  Reports her child was diagnosed with strep this morning.  PMH significant for gestational hypertension and elevated blood pressure without diagnosis of hypertension.  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 23/55/7322   H/O postpartum hemorrhage, currently pregnant 06/23/2020   Trisomy 21, 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 pregnancy, currently pregnant 06/23/2020   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   History of postpartum hypertension 06/23/2020   Supervision of high risk pregnancy, antepartum 06/23/2020   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 Authorizing Provider   amoxicillin-clavulanate (AUGMENTIN) 875-125 MG tablet Take 1 tablet by mouth every 12 (twelve) hours. 06/03/21  Yes Eliezer Lofts, FNP  fexofenadine [facility] ALLERGY) 180 MG tablet Take 1 tablet (180 mg total) by mouth daily for 15 days. 06/03/21 06/18/21 Yes Eliezer Lofts, FNP  Drospirenone 4 MG TABS Take 1 tablet by mouth daily at 6 (six) AM. Patient not taking: Reported on 02/03/2021 12/31/20   Gabriel Carina, CNM  ibuprofen (ADVIL) 600 MG tablet Take 1 tablet (600 mg total) by mouth every 6 (six) hours. 12/31/20   Gabriel Carina, CNM  NIFEdipine (ADALAT CC) 30 MG 24 hr tablet Take 1 tablet by mouth 2 times daily. 01/31/21   Rasch, Anderson Malta I, NP  Prenatal Vit-Fe Fumarate-FA (PRENATAL VITAMIN PO) Take 1 tablet by mouth daily.    [provider]  sertraline (ZOLOFT) 50 MG tablet Take 1/2  tablet (25 mg total) by mouth daily for 1 week then increase to 1 tablet daily 02/03/21   Rasch, Artist Pais, NP    Family History Family History  Problem Relation Age of Onset   Cancer Paternal Grandmother        lymph node   Gout Father    Colon cancer Neg Hx    Breast cancer Neg Hx     Social History Social  History   Tobacco Use   Smoking status: Never   Smokeless tobacco: Never  Vaping Use   Vaping Use: Never used  Substance Use Topics   Alcohol use: Not Currently    Alcohol/week: 5.0 standard drinks    Types: 2 Glasses of wine, 3 Standard drinks or equivalent per week   Drug use: No     Allergies   Patient has no known allergies.   Review of Systems Review of Systems  HENT:  Positive for sore throat.   Respiratory:  Positive for cough.   All other systems reviewed and are negative.   Physical Exam Triage Vital Signs ED Triage Vitals  Enc Vitals Group     BP 06/03/21 1350 (!) 151/106     Pulse Rate 06/03/21 1350 90     Resp 06/03/21 1350 18     Temp 06/03/21 1350 98.6 F (37 C)     Temp Source 06/03/21 1350 Oral     SpO2 06/03/21 1350 100 %     Weight --       Height --      Head Circumference --      Peak Flow --      Pain Score 06/03/21 1353 5     Pain Loc --      Pain Edu? --      Excl. in [place]? --    No data found.  Updated Vital Signs BP (!) 151/106 (BP Location: Right Arm)    Pulse 90    Temp 98.6 F (37 C) (Oral)    Resp 18    SpO2 100%    Breastfeeding Yes    Physical Exam Vitals and nursing note reviewed.  Constitutional:      General: She is not in acute distress.    Appearance: She is well-developed. She is obese. She is not ill-appearing.  HENT:     Head: Normocephalic and atraumatic.     Right Ear: Tympanic membrane and external ear normal.     Left Ear: Tympanic membrane and external ear normal.     Ears:     Comments: Moderate eustachian tube dysfunction noted bilaterally    Mouth/Throat:     Mouth: Mucous membranes are moist.     Pharynx: Oropharynx is clear. Uvula midline.     Comments: Moderate amount of clear drainage of posterior oropharynx noted Eyes:     Extraocular Movements: Extraocular movements intact.     Conjunctiva/sclera: Conjunctivae normal.     Pupils: Pupils are equal, round, and reactive to light.  Cardiovascular:     Rate and Rhythm: Normal rate and regular rhythm.     Pulses: Normal pulses.     Heart sounds: Normal heart sounds. No murmur heard. Pulmonary:     Effort: Pulmonary effort is normal.     Breath sounds: Normal breath sounds.  Musculoskeletal:     Cervical back: Normal range of motion and neck supple. No tenderness.  Lymphadenopathy:     Cervical: No cervical adenopathy.  Skin:    General: Skin is warm and dry.  Neurological:     General: No focal deficit present.     Mental Status: She is alert and oriented to person, place, and time.     UC Treatments / Results  Labs (all labs ordered are listed, but only abnormal results are displayed) Labs Reviewed  POCT RAPID STREP A (OFFICE)    EKG   Radiology No results found.  Procedures Procedures (including  critical  care time)  Medications Ordered in UC Medications - No data to display  Initial Impression / Assessment and Plan / UC Course  I have reviewed the triage vital signs and the nursing notes.  Pertinent labs & imaging results that were available during my care of the patient were reviewed by me and considered in my medical decision making (see chart for details).     MDM: 1. Subacute maxillary sinusitis-Rx'd Augmentin; 2.  Allergic rhinitis-Rx'd Allegra. Advised patient to take medication as directed with food to completion.  Advised patient to take Allegra with first dose of Augmentin for the next 5 of 7 days.  Advised may use Allegra afterwards as needed for concurrent postnasal drainage/drip.  Encouraged patient to increase daily water intake while taking this medication.  Charged home, hemodynamically stable. Final Clinical Impressions(s) / UC Diagnoses   Final diagnoses:  Sore throat  Subacute maxillary sinusitis  Allergic rhinitis, unspecified seasonality, unspecified trigger     Discharge Instructions      Advised patient to take medication as directed with food to completion.  Advised patient to take Allegra with first dose of Augmentin for the next 5 of 7 days.  Advised may use Allegra afterwards as needed for concurrent postnasal drainage/drip.  Encouraged patient to increase daily water intake while taking this medication.     ED Prescriptions     Medication Sig Dispense Auth. Provider   amoxicillin-clavulanate (AUGMENTIN) 875-125 MG tablet Take 1 tablet by mouth every 12 (twelve) hours. 14 tablet Eliezer Lofts, FNP   fexofenadine [facility] ALLERGY) 180 MG tablet Take 1 tablet (180 mg total) by mouth daily for 15 days. 15 tablet Eliezer Lofts, FNP      PDMP not reviewed this encounter.   Eliezer Lofts, [place] 06/03/21 1441

## 2021-06-12 ENCOUNTER — Encounter: Payer: Self-pay | Admitting: Physician Assistant

## 2021-06-12 ENCOUNTER — Other Ambulatory Visit: Payer: Self-pay

## 2021-06-12 ENCOUNTER — Ambulatory Visit (INDEPENDENT_AMBULATORY_CARE_PROVIDER_SITE_OTHER): Payer: No Typology Code available for payment source | Admitting: Physician Assistant

## 2021-06-12 ENCOUNTER — Other Ambulatory Visit (HOSPITAL_COMMUNITY): Payer: Self-pay

## 2021-06-12 VITALS — BP 156/100 | HR 87 | Temp 97.6°F | Ht 64.0 in | Wt 170.5 lb

## 2021-06-12 DIAGNOSIS — L989 Disorder of the skin and subcutaneous tissue, unspecified: Secondary | ICD-10-CM | POA: Diagnosis not present

## 2021-06-12 DIAGNOSIS — R030 Elevated blood-pressure reading, without diagnosis of hypertension: Secondary | ICD-10-CM | POA: Diagnosis not present

## 2021-06-12 DIAGNOSIS — B351 Tinea unguium: Secondary | ICD-10-CM

## 2021-06-12 MED ORDER — LABETALOL HCL 100 MG PO TABS
100.0000 mg | ORAL_TABLET | Freq: Two times a day (BID) | ORAL | 0 refills | Status: DC
  Filled 2021-06-12: qty 180, 90d supply, fill #0

## 2021-06-12 NOTE — Patient Instructions (Signed)
It was great to see you!  Start 100 mg labetolol twice daily  We will update blood work and urine study today  Let's follow-up in 1 month, sooner if you have concerns.  Take care,  Inda Coke PA-C

## 2021-06-12 NOTE — Progress Notes (Signed)
Lindsey Sellers is a 36 y.o. female here for a elevated BP.  History of Present Illness:   Chief Complaint  Patient presents with   c/o elevated blood pressue    Pt has been checking blood pressure at home with her watch that does blood pressure. Pt was seen at the Urgent care last week and blood pressure was high 151/106.     HPI  Elevated BP Reading Lindsey Sellers presents with concerns due to recent elevated BP reading of 156/100 while visiting the urgent care with c/o of a sinus infection on 06/12/21.   Pt had been on procardia 30 mg BID for about 5 weeks post partum due elevated BP reading. Pt only took this medication for 5 weeks due to experiencing hot flashes and headaches while on this medication. It was decided that she could stop the medication, but if readings elevate once more to f/u with PCP.   At this time she is unsure if medication is needed or not due to having elevated BP reading taken with a manual cuff that causes her anxiety. According to her, she has been monitoring her BP through her watch and has been receiving readings WNL.  This is a watch from [place] - unknown brand.  Patient denies chest pain, SOB, blurred vision, dizziness, unusual headaches, lower leg swelling. Denies excessive caffeine intake, stimulant usage, excessive alcohol intake, or increase in salt consumption.  BP Readings from Last 3 Encounters:  06/12/21 (!) 156/100  06/03/21 (!) 151/106  02/03/21 (!) 125/92    Toenail Fungus In addition to this, Lindsey Sellers has also noticed what appears to be a fungal infection of her right big toe. States that upon initial evaluation she was unable to have this treated due to breastfeeding at the time and becoming pregnant again shortly after. At this time she is interested in trying a home remedy until she is able to be treated with medication.   Skin Lesions Pt has noticed benign skin spots on bilateral arms that she would like to have evaluated by dermatology and  possibly treated. Denies concerning sx.   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   HPV changes only     Social History   Tobacco Use   Smoking status: Never   Smokeless tobacco: Never  Vaping Use   Vaping Use: Never used  Substance Use Topics   Alcohol use: Not Currently    Alcohol/week: 5.0 standard drinks    Types: 2 Glasses of wine, 3 Standard drinks or equivalent per week   Drug use: No    Past Surgical History:  Procedure Laterality Date   child birth  03/05/2018   COLPOSCOPY  2008   no biopsy done   DILATION AND CURETTAGE OF UTERUS  09/2020   WISDOM TOOTH EXTRACTION      Family History  Problem Relation Age of Onset   Cancer Paternal Grandmother        lymph node   Gout Father    Colon cancer Neg Hx    Breast cancer Neg Hx     No Known Allergies  Current Medications:   Current Outpatient Medications:    fexofenadine (ALLEGRA ALLERGY) 180 MG tablet, Take 1 tablet (180 mg total) by mouth daily for 15 days., Disp: 30 tablet, Rfl: 0   labetalol (NORMODYNE) 100 MG tablet, Take 1 tablet (100 mg total) by mouth 2 (two) times daily., Disp: 180 tablet,  Rfl: 0   Prenatal Vit-Fe Fumarate-FA (PRENATAL VITAMIN PO), Take 1 tablet by mouth daily., Disp: , Rfl:    Review of Systems:   ROS Negative unless otherwise specified per HPI. Vitals:   Vitals:   06/12/21 1445  BP: (!) 156/100  Pulse: 87  Temp: 97.6 F (36.4 C)  TempSrc: Temporal  SpO2: 98%  Weight: 170 lb 8 oz (77.3 kg)  Height: 5\' 4"  (1.626 m)     Body mass index is 29.27 kg/m.  Physical Exam:   Physical Exam Vitals and nursing note reviewed.  Constitutional:      General: She is not in acute distress.    Appearance: She is well-developed. She is not ill-appearing or toxic-appearing.  Cardiovascular:     Rate and Rhythm: Normal rate and regular rhythm.     Pulses: Normal pulses.     Heart sounds: Normal  heart sounds, S1 normal and S2 normal.  Pulmonary:     Effort: Pulmonary effort is normal.     Breath sounds: Normal breath sounds.  Skin:    General: Skin is warm and dry.     Comments: R great toenail thickened, yellow  Neurological:     Mental Status: She is alert.     GCS: GCS eye subscore is 4. GCS verbal subscore is 5. GCS motor subscore is 6.  Psychiatric:        Mood and Affect: Affect is tearful.        Speech: Speech normal.        Behavior: Behavior normal. Behavior is cooperative.    Assessment and Plan:   Elevated BP without diagnosis of hypertension Above goal today While she may have possible white coat HTN, she is at increased risk of post-partum HTN given Start labetalol 100 mg twice daily   Monitor BP regularly 1-2 times a week Encouraged patient to continue participating in healthy eating and daily exercise I advised patient that if BP readings are consistently >150/90, to reach out to office and medication will be adjusted Follow up in 1 month, sooner if concerns  Toenail fungus No red flags  Topical home remedy provided Consider podiatry referral vs medication once no long breast feeding if desired Follow up as needed  Skin lesions Dermatology referral placed Follow up if new/worsening symptoms occur  I,Havlyn C Ratchford,acting as a scribe for [employer], PA.,have documented all relevant documentation on the behalf of Inda Coke, PA,as directed by  Inda Coke, PA while in the presence of Inda Coke, [place].  I, Inda Coke, [place], have reviewed all documentation for this visit. The documentation on 06/12/21 for the exam, diagnosis, procedures, and orders are all accurate and complete.   Inda Coke, PA-C

## 2021-06-13 ENCOUNTER — Other Ambulatory Visit (HOSPITAL_COMMUNITY): Payer: Self-pay

## 2021-06-13 LAB — CBC WITH DIFFERENTIAL/PLATELET
Basophils Absolute: 0.1 10*3/uL (ref 0.0–0.1)
Basophils Relative: 0.7 % (ref 0.0–3.0)
Eosinophils Absolute: 0.1 10*3/uL (ref 0.0–0.7)
Eosinophils Relative: 1.9 % (ref 0.0–5.0)
HCT: 41.9 % (ref 36.0–46.0)
Hemoglobin: 14 g/dL (ref 12.0–15.0)
Lymphocytes Relative: 34 % (ref 12.0–46.0)
Lymphs Abs: 2.3 10*3/uL (ref 0.7–4.0)
MCHC: 33.5 g/dL (ref 30.0–36.0)
MCV: 94.8 fl (ref 78.0–100.0)
Monocytes Absolute: 0.5 10*3/uL (ref 0.1–1.0)
Monocytes Relative: 7.6 % (ref 3.0–12.0)
Neutro Abs: 3.8 10*3/uL (ref 1.4–7.7)
Neutrophils Relative %: 55.8 % (ref 43.0–77.0)
Platelets: 259 10*3/uL (ref 150.0–400.0)
RBC: 4.42 Mil/uL (ref 3.87–5.11)
RDW: 12 % (ref 11.5–15.5)
WBC: 6.9 10*3/uL (ref 4.0–10.5)

## 2021-06-13 LAB — COMPREHENSIVE METABOLIC PANEL
ALT: 56 U/L — ABNORMAL HIGH (ref 0–35)
AST: 27 U/L (ref 0–37)
Albumin: 4.8 g/dL (ref 3.5–5.2)
Alkaline Phosphatase: 56 U/L (ref 39–117)
BUN: 12 mg/dL (ref 6–23)
CO2: 30 mEq/L (ref 19–32)
Calcium: 9.8 mg/dL (ref 8.4–10.5)
Chloride: 102 mEq/L (ref 96–112)
Creatinine, Ser: 0.71 mg/dL (ref 0.40–1.20)
GFR: 108.26 mL/min (ref >60.00)
Glucose, Bld: 82 mg/dL (ref 70–99)
Potassium: 4.5 mEq/L (ref 3.5–5.1)
Sodium: 140 mEq/L (ref 135–145)
Total Bilirubin: 0.3 mg/dL (ref 0.2–1.2)
Total Protein: 7.9 g/dL (ref 6.0–8.3)

## 2021-06-13 LAB — PROTEIN / CREATININE RATIO, URINE
Creatinine, Urine: 18 mg/dL — ABNORMAL LOW (ref 20–275)
Total Protein, Urine: <4 mg/dL — ABNORMAL LOW (ref 5–24)

## 2021-06-13 LAB — TSH: TSH: 2.09 u[IU]/mL (ref 0.35–5.50)

## 2021-06-14 ENCOUNTER — Encounter: Payer: Self-pay | Admitting: Physician Assistant

## 2021-07-10 ENCOUNTER — Ambulatory Visit (INDEPENDENT_AMBULATORY_CARE_PROVIDER_SITE_OTHER): Payer: No Typology Code available for payment source | Admitting: Physician Assistant

## 2021-07-10 ENCOUNTER — Encounter: Payer: Self-pay | Admitting: Physician Assistant

## 2021-07-10 ENCOUNTER — Ambulatory Visit: Payer: No Typology Code available for payment source | Admitting: Physician Assistant

## 2021-07-10 VITALS — BP 140/84 | HR 79 | Temp 98.0°F | Ht 64.0 in | Wt 169.0 lb

## 2021-07-10 DIAGNOSIS — R519 Headache, unspecified: Secondary | ICD-10-CM

## 2021-07-10 DIAGNOSIS — R030 Elevated blood-pressure reading, without diagnosis of hypertension: Secondary | ICD-10-CM

## 2021-07-10 DIAGNOSIS — R7989 Other specified abnormal findings of blood chemistry: Secondary | ICD-10-CM

## 2021-07-10 NOTE — Progress Notes (Signed)
Lindsey Sellers is a 36 y.o. female here for a follow up of HTN. ? ?History of Present Illness:  ? ?Chief Complaint  ?Patient presents with  ? Hypertension  ?  Pt stated that she checked her Bp 3x today and each one was different.  ? ? ?Elevated BP Reading ?1 month f/u. Tonna is currently compliant with taking labetalol 100 mg twice daily with no adverse effects. At home blood pressure readings are: checked regularly. According to pt, she took her BP three times today during lunch due to finding on the Internet that you could do that and receive an average. States her first reading was 133/89 and the last reading was 113/78.  ? ?Patient denies chest pain, SOB, blurred vision, dizziness, unusual headaches, lower leg swelling.  Denies excessive caffeine intake, stimulant usage, excessive alcohol intake, or increase in salt consumption. At this time she is managing well and would like to stay on this medication/dosage.  ? ?BP Readings from Last 3 Encounters:  ?07/10/21 140/84  ?06/12/21 (!) 156/100  ?06/03/21 (!) 151/106  ? ?Headaches  ?Although she is managing well on her HTN medication, she has been noticing that she is experiencing more frequent headaches. States these headaches are manageable pain wise and don't really affect her daily tasks. She does admit that she doesn't get a regular amount of sleep all the time due to her infant child and toddler. Pt does wear glasses and contacts, but recently had an eye exam that was normal. She hasn't taken anything to treat these headaches. Despite this seeming situational, Isma decided to have this evaluated to be sure that these headaches are not HTN related.  ? ?Elevated LFTs ?Following completion of routine labs on 06/12/21, pt was found to have elevated LFTs. At this time she is managing well but is in agreement to re-check these levels today.  ? ?Past Medical History:  ?Diagnosis Date  ? Abnormal pap 04/2006  ? 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 HPV changes only  ? ?  ?Social History  ? ?Tobacco Use  ? Smoking status: Never  ? Smokeless tobacco: Never  ?Vaping Use  ? Vaping Use: Never used  ?Substance Use Topics  ? Alcohol use: Not Currently  ?  Alcohol/week: 5.0 standard drinks  ?  Types: 2 Glasses of wine, 3 Standard drinks or equivalent per week  ? Drug use: No  ? ? ?Past Surgical History:  ?Procedure Laterality Date  ? child birth  03/05/2018  ? COLPOSCOPY  2008  ? no biopsy done  ? DILATION AND CURETTAGE OF UTERUS  09/2020  ? WISDOM TOOTH EXTRACTION    ? ? ?Family History  ?Problem Relation Age of Onset  ? Cancer Paternal Grandmother   ?     lymph node  ? Gout Father   ? Colon cancer Neg Hx   ? Breast cancer Neg Hx   ? ? ?No Known Allergies ? ?Current Medications:  ? ?Current Outpatient Medications:  ?  labetalol (NORMODYNE) 100 MG tablet, Take 1 tablet  by mouth 2  times daily., Disp: 180 tablet, Rfl: 0 ?  Prenatal Vit-Fe Fumarate-FA (PRENATAL VITAMIN PO), Take 1 tablet by mouth daily., Disp: , Rfl:  ?  fexofenadine (ALLEGRA ALLERGY) 180 MG tablet, Take 1 tablet (180 mg total) by mouth daily for 15 days., Disp: 30 tablet, Rfl: 0  ? ?Review of Systems:  ? ?ROS ?Negative unless otherwise specified per HPI. ?Vitals:  ? ?Vitals:  ?  07/10/21 1407  ?BP: 140/84  ?Pulse: 79  ?Temp: 98 ?F (36.7 ?C)  ?SpO2: 99%  ?Weight: 169 lb (76.7 kg)  ?Height: '5\' 4"'$  (1.626 m)  ?   ?Body mass index is 29.01 kg/m?. ? ?Physical Exam:  ? ?Physical Exam ?Vitals and nursing note reviewed.  ?Constitutional:   ?   General: She is not in acute distress. ?   Appearance: She is well-developed. She is not ill-appearing or toxic-appearing.  ?Cardiovascular:  ?   Rate and Rhythm: Normal rate and regular rhythm.  ?   Pulses: Normal pulses.  ?   Heart sounds: Normal heart sounds, S1 normal and S2 normal.  ?Pulmonary:  ?   Effort: Pulmonary effort is normal.  ?   Breath sounds: Normal breath sounds.  ?Skin: ?   General: Skin is warm and dry.  ?Neurological:  ?    General: No focal deficit present.  ?   Mental Status: She is alert.  ?   GCS: GCS eye subscore is 4. GCS verbal subscore is 5. GCS motor subscore is 6.  ?   Cranial Nerves: Cranial nerves 2-12 are intact.  ?   Sensory: Sensation is intact.  ?   Motor: Motor function is intact.  ?   Coordination: Coordination is intact.  ?Psychiatric:     ?   Attention and Perception: Attention normal.     ?   Mood and Affect: Mood normal.     ?   Speech: Speech normal.     ?   Behavior: Behavior normal. Behavior is cooperative.  ? ? ?Assessment and Plan:  ? ?Elevated LFTs ?Update labs, will make recommendations accordingly  ? ?Elevated BP without diagnosis of hypertension ?Controlled ?Continue labetalol 100 mg twice daily  ?Monitor BP regularly 1-2 times a week ?Encouraged patient to continue participating in healthy eating and daily exercise ?I advised patient that if BP readings are consistently >150/100, to reach out to office and medication will be adjusted ?Follow up in 3 months, sooner if concerns ? ?Headaches ?No red flags ?Neuro exam benign ?Recommend ongoing efforts at regular sleep and hydration ?Continue close monitoring of BP ?If persists/worsens, let us know ? ? ?I,Havlyn C Ratchford,acting as a scribe for [employer], PA.,have documented all relevant documentation on the behalf of Inda Coke, PA,as directed by  Inda Coke, PA while in the presence of Inda Coke, [place]. ? ?IInda Coke, PA, have reviewed all documentation for this visit. The documentation on 07/10/21 for the exam, diagnosis, procedures, and orders are all accurate and complete. ? ? ?Inda Coke, PA-C ? ?

## 2021-07-10 NOTE — Patient Instructions (Signed)
It was great to see you! ? ?I think you are doing great! ? ?Continue 100 mg labetolol twice daily ?Recheck liver labs ? ?Let's follow-up in 3 months, sooner if you have concerns. ? ?Take care, ? ?Inda Coke PA-C  ?

## 2021-07-11 LAB — HEPATIC FUNCTION PANEL
ALT: 29 U/L (ref 0–35)
AST: 19 U/L (ref 0–37)
Albumin: 4.9 g/dL (ref 3.5–5.2)
Alkaline Phosphatase: 57 U/L (ref 39–117)
Bilirubin, Direct: 0 mg/dL (ref 0.0–0.3)
Total Bilirubin: 0.3 mg/dL (ref 0.2–1.2)
Total Protein: 8 g/dL (ref 6.0–8.3)

## 2021-07-21 ENCOUNTER — Other Ambulatory Visit: Payer: Self-pay | Admitting: Family Medicine

## 2021-07-21 ENCOUNTER — Other Ambulatory Visit (HOSPITAL_COMMUNITY): Payer: Self-pay

## 2021-07-25 ENCOUNTER — Other Ambulatory Visit (HOSPITAL_COMMUNITY): Payer: Self-pay

## 2021-07-26 ENCOUNTER — Other Ambulatory Visit (HOSPITAL_COMMUNITY): Payer: Self-pay

## 2021-09-19 ENCOUNTER — Other Ambulatory Visit: Payer: Self-pay | Admitting: Physician Assistant

## 2021-09-19 ENCOUNTER — Other Ambulatory Visit (HOSPITAL_COMMUNITY): Payer: Self-pay

## 2021-09-19 MED ORDER — LABETALOL HCL 100 MG PO TABS
100.0000 mg | ORAL_TABLET | Freq: Two times a day (BID) | ORAL | 0 refills | Status: DC
  Filled 2021-09-19: qty 60, 30d supply, fill #0

## 2021-09-20 ENCOUNTER — Other Ambulatory Visit (HOSPITAL_COMMUNITY): Payer: Self-pay

## 2021-10-09 NOTE — Progress Notes (Signed)
Lindsey Sellers is a 36 y.o. female here for a follow up of HTN.  SCRIBE STATEMENT  History of Present Illness:   No chief complaint on file.   HPI  Elevated BP Readings  Patient here to 3 months follow-up.   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   HPV changes only     Social History   Tobacco Use   Smoking status: Never   Smokeless tobacco: Never  Vaping Use   Vaping Use: Never used  Substance Use Topics   Alcohol use: Not Currently    Alcohol/week: 5.0 standard drinks of alcohol    Types: 2 Glasses of wine, 3 Standard drinks or equivalent per week   Drug use: No    Past Surgical History:  Procedure Laterality Date   child birth  03/05/2018   COLPOSCOPY  2008   no biopsy done   DILATION AND CURETTAGE OF UTERUS  09/2020   WISDOM TOOTH EXTRACTION      Family History  Problem Relation Age of Onset   Cancer Paternal Grandmother        lymph node   Gout Father    Colon cancer Neg Hx    Breast cancer Neg Hx     No Known Allergies  Current Medications:   Current Outpatient Medications:    fexofenadine (ALLEGRA ALLERGY) 180 MG tablet, Take 1 tablet (180 mg total) by mouth daily for 15 days., Disp: 30 tablet, Rfl: 0   labetalol (NORMODYNE) 100 MG tablet, Take 1 tablet  by mouth 2  times daily., Disp: 60 tablet, Rfl: 0   Prenatal Vit-Fe Fumarate-FA (PRENATAL VITAMIN PO), Take 1 tablet by mouth daily., Disp: , Rfl:    Review of Systems:   ROS Negative unless otherwise specified per HPI.   Vitals:   There were no vitals filed for this visit.   There is no height or weight on file to calculate BMI.  Physical Exam:   Physical Exam  Assessment and Plan:   '@DIAGLIST'$ @    I,Savera Zaman,acting as a scribe for [employer], PA.,have documented all relevant documentation on the behalf of Inda Coke, PA,as directed by  Inda Coke, PA while in the presence  of Inda Coke, [place].   ***  Inda Coke, PA-C

## 2021-10-10 ENCOUNTER — Ambulatory Visit (INDEPENDENT_AMBULATORY_CARE_PROVIDER_SITE_OTHER): Payer: No Typology Code available for payment source | Admitting: Physician Assistant

## 2021-10-10 ENCOUNTER — Encounter: Payer: Self-pay | Admitting: Physician Assistant

## 2021-10-10 ENCOUNTER — Other Ambulatory Visit (HOSPITAL_COMMUNITY): Payer: Self-pay

## 2021-10-10 VITALS — BP 126/80 | HR 72 | Temp 97.2°F | Ht 64.0 in | Wt 172.0 lb

## 2021-10-10 DIAGNOSIS — R030 Elevated blood-pressure reading, without diagnosis of hypertension: Secondary | ICD-10-CM

## 2021-10-10 MED ORDER — AMLODIPINE BESYLATE 5 MG PO TABS
5.0000 mg | ORAL_TABLET | Freq: Every day | ORAL | 1 refills | Status: DC
  Filled 2021-10-10: qty 90, 90d supply, fill #0
  Filled 2022-01-03: qty 90, 90d supply, fill #1

## 2021-10-10 NOTE — Patient Instructions (Addendum)
It was great to see you!  Continue to monitor your blood pressure.  Stop labetalol tomorrow and also start amlodipine tomorrow.  Follow-up in 3-6 months for your physical.  Take care,  Inda Coke PA-C

## 2021-10-25 ENCOUNTER — Encounter: Payer: Self-pay | Admitting: Physician Assistant

## 2021-11-23 ENCOUNTER — Encounter: Payer: Self-pay | Admitting: Physician Assistant

## 2021-11-23 DIAGNOSIS — L989 Disorder of the skin and subcutaneous tissue, unspecified: Secondary | ICD-10-CM

## 2021-12-05 ENCOUNTER — Encounter: Payer: Self-pay | Admitting: Physician Assistant

## 2021-12-05 ENCOUNTER — Other Ambulatory Visit (HOSPITAL_COMMUNITY): Payer: Self-pay

## 2021-12-05 ENCOUNTER — Ambulatory Visit (INDEPENDENT_AMBULATORY_CARE_PROVIDER_SITE_OTHER): Payer: No Typology Code available for payment source | Admitting: Physician Assistant

## 2021-12-05 VITALS — BP 126/88 | HR 80 | Temp 98.3°F | Ht 64.0 in | Wt 168.0 lb

## 2021-12-05 DIAGNOSIS — Z0000 Encounter for general adult medical examination without abnormal findings: Secondary | ICD-10-CM

## 2021-12-05 DIAGNOSIS — R030 Elevated blood-pressure reading, without diagnosis of hypertension: Secondary | ICD-10-CM | POA: Diagnosis not present

## 2021-12-05 DIAGNOSIS — Z30011 Encounter for initial prescription of contraceptive pills: Secondary | ICD-10-CM | POA: Diagnosis not present

## 2021-12-05 DIAGNOSIS — E663 Overweight: Secondary | ICD-10-CM

## 2021-12-05 MED ORDER — LEVONORGEST-ETH ESTRAD 91-DAY 0.15-0.03 &0.01 MG PO TABS
1.0000 | ORAL_TABLET | Freq: Every day | ORAL | 3 refills | Status: DC
  Filled 2021-12-05: qty 91, 91d supply, fill #0
  Filled 2022-04-01: qty 91, 91d supply, fill #1
  Filled 2022-07-02 – 2022-07-09 (×2): qty 91, 91d supply, fill #2
  Filled 2022-09-17 – 2022-10-23 (×6): qty 91, 91d supply, fill #3

## 2021-12-05 NOTE — Patient Instructions (Signed)
It was great to see you!  [facility] -Dr Sarajane Jews Address: [place] [address] Phone: [phone] Fax: [phone]  Update blood work today  Check your blood pressure when starting birth control and keep me posted if it is >130/80  Take care,  Inda Coke PA-C

## 2021-12-05 NOTE — Progress Notes (Signed)
Subjective:    Lindsey Sellers is a 36 y.o. female and is here for a comprehensive physical exam.  HPI  Health Maintenance Due  Topic Date Due   INFLUENZA VACCINE  11/21/2021    Acute Concerns: None  Chronic Issues: Gestational elevated blood pressure Currently taking amlodopine 5 mg. At home blood pressure readings are: well controlled. Patient denies chest pain, SOB, blurred vision, dizziness, unusual headaches, lower leg swelling. Patient is  compliant with medication. Denies excessive caffeine intake, stimulant usage, excessive alcohol intake, or increase in salt consumption.  BP Readings from Last 3 Encounters:  12/05/21 126/88  10/10/21 126/80  07/10/21 140/84   Health Maintenance: Immunizations -- UTD PAP -- 07/2019 Bone Density -- n/a Diet -- balanced Sleep habits -- overall ok Exercise -- walking as able Current Weight -- Weight: 168 lb (76.2 kg)  Weight History: Wt Readings from Last 10 Encounters:  12/05/21 168 lb (76.2 kg)  10/10/21 172 lb (78 kg)  07/10/21 169 lb (76.7 kg)  06/12/21 170 lb 8 oz (77.3 kg)  02/03/21 171 lb (77.6 kg)  01/05/21 168 lb (76.2 kg)  01/03/21 171 lb 12.8 oz (77.9 kg)  12/29/20 187 lb 4.8 oz (85 kg)  12/24/20 186 lb (84.4 kg)  12/19/20 185 lb (83.9 kg)   Body mass index is 28.84 kg/m. Mood -- overall stable  Patient's last menstrual period was 11/24/2021 (exact date). Period characteristics -- would like to resume OCP Birth control -- husband has had a vasectomy    reports that she does not currently use alcohol after a past usage of about 5.0 standard drinks of alcohol per week.  Tobacco Use: Low Risk  (12/05/2021)   Patient History    Smoking Tobacco Use: Never    Smokeless Tobacco Use: Never    Passive Exposure: Not on file        12/05/2021    2:41 PM  Depression screen PHQ 2/9  Decreased Interest 0  Down, Depressed, Hopeless 0  PHQ - 2 Score 0     Other providers/specialists: Patient Care  Team: Inda Coke, [place] as PCP - General (Physician Assistant) Regina Eck, CNM as Referring Physician (Certified Nurse Midwife)   PMHx, SurgHx, SocialHx, Medications, and Allergies were reviewed in the Visit Navigator and updated as appropriate.   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   HPV changes only     Past Surgical History:  Procedure Laterality Date   child birth  03/05/2018   COLPOSCOPY  2008   no biopsy done   DILATION AND CURETTAGE OF UTERUS  09/2020   WISDOM TOOTH EXTRACTION       Family History  Problem Relation Age of Onset   Cancer Paternal Grandmother        lymph node   Gout Father    Colon cancer Neg Hx    Breast cancer Neg Hx     Social History   Tobacco Use   Smoking status: Never   Smokeless tobacco: Never  Vaping Use   Vaping Use: Never used  Substance Use Topics   Alcohol use: Not Currently    Alcohol/week: 5.0 standard drinks of alcohol    Types: 2 Glasses of wine, 3 Standard drinks or equivalent per week   Drug use: No    Review of Systems:   Review of Systems  Constitutional:  Negative for chills, fever, malaise/fatigue and weight  loss.  HENT:  Negative for hearing loss, sinus pain and sore throat.   Respiratory:  Negative for cough and hemoptysis.   Cardiovascular:  Negative for chest pain, palpitations, leg swelling and PND.  Gastrointestinal:  Negative for abdominal pain, constipation, diarrhea, heartburn, nausea and vomiting.  Genitourinary:  Negative for dysuria, frequency and urgency.  Musculoskeletal:  Negative for back pain, myalgias and neck pain.  Skin:  Negative for itching and rash.  Neurological:  Negative for dizziness, tingling, seizures and headaches.  Endo/Heme/Allergies:  Negative for polydipsia.  Psychiatric/Behavioral:  Negative for depression. The patient is not nervous/anxious.     Objective:   BP 126/88 (BP  Location: Left Arm, Patient Position: Sitting, Cuff Size: Normal)   Pulse 80   Temp 98.3 F (36.8 C) (Temporal)   Ht '5\' 4"'$  (1.626 m)   Wt 168 lb (76.2 kg)   LMP 11/24/2021 (Exact Date)   SpO2 97%   Breastfeeding No   BMI 28.84 kg/m   General Appearance:    Alert, cooperative, no distress, appears stated age  Head:    Normocephalic, without obvious abnormality, atraumatic  Eyes:    PERRL, conjunctiva/corneas clear, EOM's intact, fundi    benign, both eyes  Ears:    Normal TM's and external ear canals, both ears  Nose:   Nares normal, septum midline, mucosa normal, no drainage    or sinus tenderness  Throat:   Lips, mucosa, and tongue normal; teeth and gums normal  Neck:   Supple, symmetrical, trachea midline, no adenopathy;    thyroid:  no enlargement/tenderness/nodules; no carotid   bruit or JVD  Back:     Symmetric, no curvature, ROM normal, no CVA tenderness  Lungs:     Clear to auscultation bilaterally, respirations unlabored  Chest Wall:    No tenderness or deformity   Heart:    Regular rate and rhythm, S1 and S2 normal, no murmur, rub   or gallop  Breast Exam:    Deferred  Abdomen:     Soft, non-tender, bowel sounds active all four quadrants,    no masses, no organomegaly  Genitalia:    Deferred  Rectal:    Deferred  Extremities:   Extremities normal, atraumatic, no cyanosis or edema  Pulses:   2+ and symmetric all extremities  Skin:   Skin color, texture, turgor normal, no rashes or lesions  Lymph nodes:   Cervical, supraclavicular, and axillary nodes normal  Neurologic:   CNII-XII intact, normal strength, sensation and reflexes    throughout    Assessment/Plan:   Routine physical examination Today patient counseled on age appropriate routine health concerns for screening and prevention, each reviewed and up to date or declined. Immunizations reviewed and up to date or declined. Labs ordered and reviewed. Risk factors for depression reviewed and negative. Hearing  function and visual acuity are intact. ADLs screened and addressed as needed. Functional ability and level of safety reviewed and appropriate. Education, counseling and referrals performed based on assessed risks today. Patient provided with a copy of personalized plan for preventive services.  Overweight Continue efforts at healthy diet and exercise  Elevated BP without diagnosis of hypertension Continue to monitor I'm hopeful that with diet and exercise, as well as some slight weight loss, she can stop this medication Continue to check blood pressure at home  OCP initiation Will trial Seasonique -- she has been on this in the past Discussed that as long as her blood pressure is well controlled, we can trial  this medication however there is a small chance of increased risk of CVD events with this medication She verbalizes understanding and will check her blood pressure regularly when starting this Reach out if any concerns If BP >130/90, will stop COC   Patient Counseling:   '[x]'$     Nutrition: Stressed importance of moderation in sodium/caffeine intake, saturated fat and cholesterol, caloric balance, sufficient intake of fresh fruits, vegetables, fiber, calcium, iron, and 1 mg of folate supplement per day (for females capable of pregnancy).   '[x]'$      Stressed the importance of regular exercise.    '[x]'$     Substance Abuse: Discussed cessation/primary prevention of tobacco, alcohol, or other drug use; driving or other dangerous activities under the influence; availability of treatment for abuse.    '[x]'$      Injury prevention: Discussed safety belts, safety helmets, smoke detector, smoking near bedding or upholstery.    '[x]'$      Sexuality: Discussed sexually transmitted diseases, partner selection, use of condoms, avoidance of unintended pregnancy  and contraceptive alternatives.    '[x]'$     Dental health: Discussed importance of regular tooth brushing, flossing, and dental visits.    '[x]'$      Health maintenance and immunizations reviewed. Please refer to Health maintenance section.     Inda Coke, PA-C [place]

## 2021-12-06 LAB — COMPREHENSIVE METABOLIC PANEL
ALT: 29 U/L (ref 0–35)
AST: 20 U/L (ref 0–37)
Albumin: 4.8 g/dL (ref 3.5–5.2)
Alkaline Phosphatase: 45 U/L (ref 39–117)
BUN: 11 mg/dL (ref 6–23)
CO2: 26 mEq/L (ref 19–32)
Calcium: 9.8 mg/dL (ref 8.4–10.5)
Chloride: 101 mEq/L (ref 96–112)
Creatinine, Ser: 0.74 mg/dL (ref 0.40–1.20)
GFR: 102.67 mL/min (ref >60.00)
Glucose, Bld: 87 mg/dL (ref 70–99)
Potassium: 4.3 mEq/L (ref 3.5–5.1)
Sodium: 138 mEq/L (ref 135–145)
Total Bilirubin: 0.3 mg/dL (ref 0.2–1.2)
Total Protein: 7.4 g/dL (ref 6.0–8.3)

## 2021-12-06 LAB — LIPID PANEL
Cholesterol: 209 mg/dL — ABNORMAL HIGH (ref 0–200)
HDL: 67.7 mg/dL (ref >39.00)
LDL Cholesterol: 107 mg/dL — ABNORMAL HIGH (ref 0–99)
NonHDL: 141.26
Total CHOL/HDL Ratio: 3
Triglycerides: 170 mg/dL — ABNORMAL HIGH (ref 0.0–149.0)
VLDL: 34 mg/dL (ref 0.0–40.0)

## 2021-12-06 LAB — CBC WITH DIFFERENTIAL/PLATELET
Basophils Absolute: 0.1 10*3/uL (ref 0.0–0.1)
Basophils Relative: 1 % (ref 0.0–3.0)
Eosinophils Absolute: 0.1 10*3/uL (ref 0.0–0.7)
Eosinophils Relative: 0.8 % (ref 0.0–5.0)
HCT: 43 % (ref 36.0–46.0)
Hemoglobin: 14.2 g/dL (ref 12.0–15.0)
Lymphocytes Relative: 26.2 % (ref 12.0–46.0)
Lymphs Abs: 1.9 10*3/uL (ref 0.7–4.0)
MCHC: 33.1 g/dL (ref 30.0–36.0)
MCV: 94.5 fl (ref 78.0–100.0)
Monocytes Absolute: 0.5 10*3/uL (ref 0.1–1.0)
Monocytes Relative: 6.4 % (ref 3.0–12.0)
Neutro Abs: 4.9 10*3/uL (ref 1.4–7.7)
Neutrophils Relative %: 65.6 % (ref 43.0–77.0)
Platelets: 235 10*3/uL (ref 150.0–400.0)
RBC: 4.55 Mil/uL (ref 3.87–5.11)
RDW: 13.2 % (ref 11.5–15.5)
WBC: 7.5 10*3/uL (ref 4.0–10.5)

## 2022-01-03 ENCOUNTER — Other Ambulatory Visit (HOSPITAL_COMMUNITY): Payer: Self-pay

## 2022-01-04 ENCOUNTER — Encounter: Payer: No Typology Code available for payment source | Admitting: Physician Assistant

## 2022-01-09 ENCOUNTER — Ambulatory Visit: Payer: No Typology Code available for payment source | Admitting: Dermatology

## 2022-01-15 ENCOUNTER — Encounter: Payer: Self-pay | Admitting: *Deleted

## 2022-04-01 ENCOUNTER — Other Ambulatory Visit: Payer: Self-pay | Admitting: Physician Assistant

## 2022-04-02 ENCOUNTER — Other Ambulatory Visit (HOSPITAL_COMMUNITY): Payer: Self-pay

## 2022-04-02 ENCOUNTER — Encounter: Payer: Self-pay | Admitting: Physician Assistant

## 2022-04-02 MED ORDER — AMLODIPINE BESYLATE 5 MG PO TABS
5.0000 mg | ORAL_TABLET | Freq: Every day | ORAL | 1 refills | Status: DC
  Filled 2022-04-02: qty 90, 90d supply, fill #0
  Filled 2022-07-02 – 2022-07-09 (×2): qty 90, 90d supply, fill #1

## 2022-04-05 ENCOUNTER — Encounter: Payer: Self-pay | Admitting: *Deleted

## 2022-06-05 ENCOUNTER — Ambulatory Visit (INDEPENDENT_AMBULATORY_CARE_PROVIDER_SITE_OTHER): Payer: 59 | Admitting: Physician Assistant

## 2022-06-05 ENCOUNTER — Other Ambulatory Visit (HOSPITAL_BASED_OUTPATIENT_CLINIC_OR_DEPARTMENT_OTHER): Payer: Self-pay

## 2022-06-05 ENCOUNTER — Encounter: Payer: Self-pay | Admitting: Physician Assistant

## 2022-06-05 VITALS — BP 106/70 | HR 126 | Temp 98.0°F | Ht 64.0 in | Wt 160.2 lb

## 2022-06-05 DIAGNOSIS — J029 Acute pharyngitis, unspecified: Secondary | ICD-10-CM

## 2022-06-05 LAB — POC INFLUENZA A&B (BINAX/QUICKVUE)
Influenza A, POC: NEGATIVE
Influenza B, POC: NEGATIVE

## 2022-06-05 LAB — POCT RAPID STREP A (OFFICE): Rapid Strep A Screen: NEGATIVE

## 2022-06-05 MED ORDER — AMOXICILLIN-POT CLAVULANATE 875-125 MG PO TABS
1.0000 | ORAL_TABLET | Freq: Two times a day (BID) | ORAL | 0 refills | Status: DC
  Filled 2022-06-05: qty 20, 10d supply, fill #0

## 2022-06-05 MED ORDER — PREDNISONE 20 MG PO TABS
40.0000 mg | ORAL_TABLET | Freq: Every day | ORAL | 0 refills | Status: DC
  Filled 2022-06-05: qty 10, 5d supply, fill #0

## 2022-06-05 NOTE — Progress Notes (Signed)
Lindsey Sellers is a 37 y.o. female here for a new problem.  History of Present Illness:   Chief Complaint  Patient presents with   Sore Throat    Pt c/o sore throat started yesterday, woke up with fever 102.5 . Took Tylenol, Difficulty swallowing.    Sore Throat     Sore throat Went to [facility] over the weekend Sore throat and fever sudden onset yesterday Fever up to 102.5 yesterday Has taken Dayquil with minimal relief of pain but did reduce fever  Denies: inability to control secretions, n/v/d, muffled voice, lethargy, stiff neck   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   HPV changes only     Social History   Tobacco Use   Smoking status: Never   Smokeless tobacco: Never  Vaping Use   Vaping Use: Never used  Substance Use Topics   Alcohol use: Not Currently    Alcohol/week: 5.0 standard drinks of alcohol    Types: 2 Glasses of wine, 3 Standard drinks or equivalent per week   Drug use: No    Past Surgical History:  Procedure Laterality Date   child birth  03/05/2020   COLPOSCOPY  2008   no biopsy done   DILATION AND CURETTAGE OF UTERUS  09/2020   WISDOM TOOTH EXTRACTION      Family History  Problem Relation Age of Onset   Cancer Paternal Grandmother        lymph node   Gout Father    Colon cancer Neg Hx    Breast cancer Neg Hx     No Known Allergies  Current Medications:   Current Outpatient Medications:    amLODipine (NORVASC) 5 MG tablet, Take 1 tablet (5 mg total) by mouth daily., Disp: 90 tablet, Rfl: 1   fexofenadine (ALLEGRA) 180 MG tablet, Take 180 mg by mouth daily., Disp: , Rfl:    Levonorgestrel-Ethinyl Estradiol (AMETHIA) 0.15-0.03 &0.01 MG tablet, Take 1 tablet by mouth daily., Disp: 91 tablet, Rfl: 3   Prenatal Vit-Fe Fumarate-FA (PRENATAL VITAMIN PO), Take 1 tablet by mouth daily., Disp: , Rfl:    Review of Systems:   ROS Negative unless  otherwise specified per HPI.   Vitals:   Vitals:   06/05/22 1130  BP: 106/70  Pulse: (!) 126  Temp: 98 F (36.7 C)  TempSrc: Temporal  SpO2: 96%  Weight: 160 lb 4 oz (72.7 kg)  Height: 5' 4"$  (1.626 m)     Body mass index is 27.51 kg/m.  Physical Exam:   Physical Exam Vitals and nursing note reviewed.  Constitutional:      General: She is not in acute distress.    Appearance: She is well-developed. She is not ill-appearing or toxic-appearing.  HENT:     Head: Normocephalic and atraumatic.     Right Ear: Tympanic membrane, ear canal and external ear normal. Tympanic membrane is not erythematous, retracted or bulging.     Left Ear: Tympanic membrane, ear canal and external ear normal. Tympanic membrane is not erythematous, retracted or bulging.     Nose: Nose normal.     Right Sinus: No maxillary sinus tenderness or frontal sinus tenderness.     Left Sinus: No maxillary sinus tenderness or frontal sinus tenderness.     Mouth/Throat:     Pharynx: Uvula midline. Posterior oropharyngeal erythema present.     Tonsils: Tonsillar exudate present. 1+ on the right. 1+ on  the left.     Comments: Slightly larger R tonsil compared to L tonsil Eyes:     General: Lids are normal.     Conjunctiva/sclera: Conjunctivae normal.  Neck:     Trachea: Trachea normal.  Cardiovascular:     Rate and Rhythm: Normal rate and regular rhythm.     Heart sounds: Normal heart sounds, S1 normal and S2 normal.  Pulmonary:     Effort: Pulmonary effort is normal.     Breath sounds: Normal breath sounds. No decreased breath sounds, wheezing, rhonchi or rales.  Lymphadenopathy:     Cervical: Cervical adenopathy present.  Skin:    General: Skin is warm and dry.  Neurological:     Mental Status: She is alert.  Psychiatric:        Speech: Speech normal.        Behavior: Behavior normal. Behavior is cooperative.    Results for orders placed or performed in visit on 06/05/22  POC Influenza  A&B(BINAX/QUICKVUE)  Result Value Ref Range   Influenza A, POC Negative Negative   Influenza B, POC Negative Negative  POCT rapid strep A  Result Value Ref Range   Rapid Strep A Screen Negative Negative    Assessment and Plan:   Sore throat No red flags on exam.  Will treat as tonsillitis. I did tell her that I cannot rule out possible abscess or forming abscess without CT, given slight asymmetry of tonsils. She verbalized understanding of this and requested to trial abx and prednisone. Start augmentin per orders, prednisone also ordered Push fluids and rest Low threshold to go to the ER if any worsening sx Handout provided with red flag sx  Inda Coke, PA-C

## 2022-06-05 NOTE — Patient Instructions (Signed)
It was great to see you!  Start augmentin antibiotic and prednisone Avoid ibuprofen while on prednisone, may take tylenol if needed  General instructions Rest as told by your doctor. Return to your normal activities as told by your doctor. Ask your doctor what activities are safe for you. If your abscess was drained, rinse your mouth often with salt water. To make salt water, dissolve -1 tsp (3-6 g) of salt in 1 cup (237 mL) of warm water. Do not swallow this mixture. Do not smoke or use any products that contain nicotine or tobacco. If you need help quitting, ask your doctor. Keep all follow-up visits.  Contact a doctor if: You have more pain, swelling, redness, or pus in your throat. You have a headache. You have low energy or feel generally sick. You have a fever or chills. You have trouble swallowing or eating. You have signs of not enough water in the body (dehydration), such as: Feeling light-headed or dizzy when you are standing. Peeing (urinating) less than usual. A fast heart rate. Dry mouth.  Get help right away if: You are unable to swallow. You have trouble breathing. Breathing is easier when you lean forward. You cough up blood. You vomit blood. You have very bad throat pain and it does not get better with medicine.  These symptoms may be an emergency. Get help right away. Call your local emergency services (911 in the [place]  Take care,  Inda Coke PA-C

## 2022-07-02 ENCOUNTER — Encounter: Payer: Self-pay | Admitting: Physician Assistant

## 2022-07-02 ENCOUNTER — Ambulatory Visit (INDEPENDENT_AMBULATORY_CARE_PROVIDER_SITE_OTHER): Payer: 59 | Admitting: Physician Assistant

## 2022-07-02 VITALS — BP 150/96 | HR 128 | Temp 97.7°F | Ht 64.0 in | Wt 161.0 lb

## 2022-07-02 DIAGNOSIS — L819 Disorder of pigmentation, unspecified: Secondary | ICD-10-CM | POA: Diagnosis not present

## 2022-07-02 DIAGNOSIS — R030 Elevated blood-pressure reading, without diagnosis of hypertension: Secondary | ICD-10-CM

## 2022-07-02 NOTE — Patient Instructions (Signed)
It was great to see you!  Keep an eye on your blood pressure Keep [name] posted if readings are consistently >140/90  I will place urgent referral for Dermatology -- please let me know when you get scheduled!  Take care,  Inda Coke PA-C

## 2022-07-02 NOTE — Progress Notes (Signed)
Lindsey Sellers is a 37 y.o. female here for a new problem.  History of Present Illness:   Chief Complaint  Patient presents with   skin irritation    Pt c/o red, itchy irritated spot on left deltoid. Pt has not tried any medications.    HPI  Spot on left arm:  She complains of a spot on her left upper arm appearing about 2 weeks ago.  She endorses the spot is red, itchy, warm and irritated.  She states mild pain when pressed on.  She notes that over the weekend it has gotten larger and feels more irritated. She follows up with [facility], but was not able to get an appointment in until April.   Blood pressure:  She reports her elevated blood pressure may be attributed to stress associated with the spot on her arm.  She took her amlodipine this morning.  Denies chest pain, SOB, LE swelling. Has had significant amount of coffee this am.  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   HPV changes only     Social History   Tobacco Use   Smoking status: Never   Smokeless tobacco: Never  Vaping Use   Vaping Use: Never used  Substance Use Topics   Alcohol use: Not Currently    Alcohol/week: 5.0 standard drinks of alcohol    Types: 2 Glasses of wine, 3 Standard drinks or equivalent per week   Drug use: No    Past Surgical History:  Procedure Laterality Date   child birth  03/05/2020   COLPOSCOPY  2008   no biopsy done   DILATION AND CURETTAGE OF UTERUS  09/2020   WISDOM TOOTH EXTRACTION      Family History  Problem Relation Age of Onset   Cancer Paternal Grandmother        lymph node   Gout Father    Colon cancer Neg Hx    Breast cancer Neg Hx     No Known Allergies  Current Medications:   Current Outpatient Medications:    amLODipine (NORVASC) 5 MG tablet, Take 1 tablet (5 mg total) by mouth daily., Disp: 90 tablet, Rfl: 1   fexofenadine (ALLEGRA) 180 MG tablet,  Take 180 mg by mouth daily., Disp: , Rfl:    Levonorgestrel-Ethinyl Estradiol (AMETHIA) 0.15-0.03 &0.01 MG tablet, Take 1 tablet by mouth daily., Disp: 91 tablet, Rfl: 3   Prenatal Vit-Fe Fumarate-FA (PRENATAL VITAMIN PO), Take 1 tablet by mouth daily., Disp: , Rfl:    Review of Systems:   Review of Systems  Skin:  Positive for itching.       (+) spot on left upper arm -- red, itchy, warm, irritated    Vitals:   Vitals:   07/02/22 1321 07/02/22 1347  BP: (!) 140/100 (!) 150/96  Pulse: (!) 128   Temp: 97.7 F (36.5 C)   TempSrc: Temporal   SpO2: 98%   Weight: 161 lb (73 kg)   Height: '5\' 4"'$  (1.626 m)      Body mass index is 27.64 kg/m.  Physical Exam:   Physical Exam Vitals and nursing note reviewed.  Constitutional:      General: She is not in acute distress.    Appearance: She is well-developed. She is not ill-appearing or toxic-appearing.  Cardiovascular:     Rate and Rhythm: Normal rate and regular rhythm.     Pulses: Normal pulses.  Heart sounds: Normal heart sounds, S1 normal and S2 normal.  Pulmonary:     Effort: Pulmonary effort is normal.     Breath sounds: Normal breath sounds.  Skin:    General: Skin is warm and dry.     Comments: Erythematous red raised lesion to lateral L upper arm  Neurological:     Mental Status: She is alert.     GCS: GCS eye subscore is 4. GCS verbal subscore is 5. GCS motor subscore is 6.  Psychiatric:        Speech: Speech normal.        Behavior: Behavior normal. Behavior is cooperative.     Assessment and Plan:   Atypical pigmented skin lesion Unclear etiology Offered punch biopsy vs urgent referral to [place]  She would like urgent referral to [place] I have placed this referral  Elevated BP without diagnosis of hypertension Upon recheck was still elevated Patient is without any obvious signs of end-organ damage I have asked her to keep an eye on her blood pressure at home and if consistently  >140/90 to reach out and let us know   I,Rachel Rivera,acting as a scribe for Inda Coke, PA.,have documented all relevant documentation on the behalf of Inda Coke, PA,as directed by  Inda Coke, PA while in the presence of Inda Coke, [place].  I, Inda Coke, [place], have reviewed all documentation for this visit. The documentation on 07/02/22 for the exam, diagnosis, procedures, and orders are all accurate and complete.   Inda Coke, PA-C

## 2022-07-09 ENCOUNTER — Other Ambulatory Visit (HOSPITAL_BASED_OUTPATIENT_CLINIC_OR_DEPARTMENT_OTHER): Payer: Self-pay

## 2022-07-23 ENCOUNTER — Encounter: Payer: Self-pay | Admitting: Dermatology

## 2022-07-23 ENCOUNTER — Ambulatory Visit: Payer: 59 | Admitting: Dermatology

## 2022-07-23 VITALS — BP 132/85

## 2022-07-23 DIAGNOSIS — D485 Neoplasm of uncertain behavior of skin: Secondary | ICD-10-CM

## 2022-07-23 DIAGNOSIS — D2262 Melanocytic nevi of left upper limb, including shoulder: Secondary | ICD-10-CM | POA: Diagnosis not present

## 2022-07-23 DIAGNOSIS — L820 Inflamed seborrheic keratosis: Secondary | ICD-10-CM | POA: Diagnosis not present

## 2022-07-23 NOTE — Progress Notes (Unsigned)
   New Patient Visit   Subjective  Lindsey Sellers is a 37 y.o. female who presents for the following: Spot of left upper arm that popped up ~6 weeks ago. Itched a little and is uncomfortable.   The following portions of the chart were reviewed this encounter and updated as appropriate: medications, allergies, medical history  Review of Systems:  No other skin or systemic complaints except as noted in HPI or Assessment and Plan.  Objective  Well appearing patient in no apparent distress; mood and affect are within normal limits.   A focused examination was performed of the following areas: Left upper arm   Relevant exam findings are noted in the Assessment and Plan.  Left Upper Arm - Anterior 8 mm scaly pink papule         Assessment & Plan     Neoplasm of uncertain behavior of skin Left Upper Arm - Anterior  Skin / nail biopsy Type of biopsy: tangential   Informed consent: discussed and consent obtained   Timeout: patient name, date of birth, surgical site, and procedure verified   Procedure prep:  Patient was prepped and draped in usual sterile fashion Prep type:  Isopropyl alcohol Anesthesia: the lesion was anesthetized in a standard fashion   Anesthetic:  1% lidocaine w/ epinephrine 1-100,000 buffered w/ 8.4% NaHCO3 Instrument used: flexible razor blade   Hemostasis achieved with: pressure, aluminum chloride and electrodesiccation   Outcome: patient tolerated procedure well   Post-procedure details: sterile dressing applied and wound care instructions given   Dressing type: bandage and petrolatum    Specimen 1 - Surgical pathology Differential Diagnosis: R/O Psoriasis vs Inflamed SK Check Margins: No    Return if symptoms worsen or fail to improve.  MELANOCYTIC NEVI Benign appearing on exam today. Recommend observation. Call clinic for new or changing moles. Recommend daily use of broad spectrum spf 30+ sunscreen to sun-exposed areas.   Exam Tan-brown and/or pink-flesh-colored symmetric macules and papules    I, Hollie Dean, CMA, am acting as scribe for Ellard Artis, MD .   Documentation: I have reviewed the above documentation for accuracy and completeness, and I agree with the above.  Ellard Artis, MD

## 2022-07-23 NOTE — Patient Instructions (Addendum)
Patient Handout: Wound Care for Skin Biopsy Site  Patient Handout: Wound Care for Skin Biopsy Site  Taking Care of Your Skin Biopsy Site  Proper care of the biopsy site is essential for promoting healing and minimizing scarring. This handout provides instructions on how to care for your biopsy site to ensure optimal recovery.  1. Cleaning the Wound:  Clean the biopsy site daily with gentle soap and water. Gently pat the area dry with a clean, soft towel. Avoid harsh scrubbing or rubbing the area, as this can irritate the skin and delay healing.  2. Applying Aquaphor and Bandage:  After cleaning the wound, apply a thin layer of Aquaphor ointment to the biopsy site. Cover the area with a sterile bandage to protect it from dirt, bacteria, and friction. Change the bandage daily or as needed if it becomes soiled or wet.  3. Continued Care for One Week:  Repeat the cleaning, Aquaphor application, and bandaging process daily for one week following the biopsy procedure. Keeping the wound clean and moist during this initial healing period will help prevent infection and promote optimal healing.  4. Massaging Aquaphor into the Area:  ---After one week, discontinue the use of bandages but continue to apply Aquaphor to the biopsy site. ----Gently massage the Aquaphor into the area using circular motions. ---Massaging the skin helps to promote circulation and prevent the formation of scar tissue.   Additional Tips:  Avoid exposing the biopsy site to direct sunlight during the healing process, as this can cause hyperpigmentation or worsen scarring. If you experience any signs of infection, such as increased redness, swelling, warmth, or drainage from the wound, contact your healthcare provider immediately. Follow any additional instructions provided by your healthcare provider for caring for the biopsy site and managing any discomfort. Conclusion:  Taking proper care of your skin biopsy site  is crucial for ensuring optimal healing and minimizing scarring. By following these instructions for cleaning, applying Aquaphor, and massaging the area, you can promote a smooth and successful recovery. If you have any questions or concerns about caring for your biopsy site, don't hesitate to contact your healthcare provider for guidance.      Due to recent changes in healthcare laws, you may see results of your pathology and/or laboratory studies on MyChart before the doctors have had a chance to review them. We understand that in some cases there may be results that are confusing or concerning to you. Please understand that not all results are received at the same time and often the doctors may need to interpret multiple results in order to provide you with the best plan of care or course of treatment. Therefore, we ask that you please give us 2 business days to thoroughly review all your results before contacting the office for clarification. Should we see a critical lab result, you will be contacted sooner.   If You Need Anything After Your Visit  If you have any questions or concerns for your doctor, please call our main line at [phone] If no one answers, please leave a voicemail as directed and we will return your call as soon as possible. Messages left after 4 pm will be answered the following business day.   You may also send us a message via MyChart. We typically respond to MyChart messages within 1-2 business days.  For prescription refills, please ask your pharmacy to contact our office. Our fax number is [phone].  If you have an urgent issue when the clinic is   closed that cannot wait until the next business day, you can page your doctor at the number below.    Please note that while we do our best to be available for urgent issues outside of office hours, we are not available 24/7.   If you have an urgent issue and are unable to reach us, you may choose to seek medical care  at your doctor's office, retail clinic, urgent care center, or emergency room.  If you have a medical emergency, please immediately call 911 or go to the emergency department. In the event of inclement weather, please call our main line at [phone] for an update on the status of any delays or closures.  Dermatology Medication Tips: Please keep the boxes that topical medications come in in order to help keep track of the instructions about where and how to use these. Pharmacies typically print the medication instructions only on the boxes and not directly on the medication tubes.   If your medication is too expensive, please contact our office at [phone] or send us a message through MyChart.   We are unable to tell what your co-pay for medications will be in advance as this is different depending on your insurance coverage. However, we may be able to find a substitute medication at lower cost or fill out paperwork to get insurance to cover a needed medication.   If a prior authorization is required to get your medication covered by your insurance company, please allow us 1-2 business days to complete this process.  Drug prices often vary depending on where the prescription is filled and some pharmacies may offer cheaper prices.  The website www.goodrx.com contains coupons for medications through different pharmacies. The prices here do not account for what the cost may be with help from insurance (it may be cheaper with your insurance), but the website can give you the price if you did not use any insurance.  - You can print the associated coupon and take it with your prescription to the pharmacy.  - You may also stop by our office during regular business hours and pick up a GoodRx coupon card.  - If you need your prescription sent electronically to a different pharmacy, notify our office through [facility] MyChart or by phone at [phone]     

## 2022-08-03 NOTE — Progress Notes (Signed)
Hi Lindsey Sellers  Dr. Onalee Hua reviewed your biopsy results and it showed the spot removed was benign (not cancerous).  No additional treatment is required.  The detailed report is available to view in MyChart.  Have a great day!  Kind Regards,  Dr. Kermit Balo Care Team

## 2022-09-17 ENCOUNTER — Other Ambulatory Visit: Payer: Self-pay | Admitting: Physician Assistant

## 2022-09-18 ENCOUNTER — Other Ambulatory Visit (HOSPITAL_BASED_OUTPATIENT_CLINIC_OR_DEPARTMENT_OTHER): Payer: Self-pay

## 2022-09-18 MED ORDER — AMLODIPINE BESYLATE 5 MG PO TABS
5.0000 mg | ORAL_TABLET | Freq: Every day | ORAL | 0 refills | Status: DC
  Filled 2022-09-18 – 2022-09-21 (×2): qty 90, 90d supply, fill #0

## 2022-09-20 ENCOUNTER — Other Ambulatory Visit (HOSPITAL_BASED_OUTPATIENT_CLINIC_OR_DEPARTMENT_OTHER): Payer: Self-pay

## 2022-09-21 ENCOUNTER — Other Ambulatory Visit (HOSPITAL_BASED_OUTPATIENT_CLINIC_OR_DEPARTMENT_OTHER): Payer: Self-pay

## 2022-09-22 ENCOUNTER — Other Ambulatory Visit (HOSPITAL_BASED_OUTPATIENT_CLINIC_OR_DEPARTMENT_OTHER): Payer: Self-pay

## 2022-10-23 ENCOUNTER — Other Ambulatory Visit (HOSPITAL_BASED_OUTPATIENT_CLINIC_OR_DEPARTMENT_OTHER): Payer: Self-pay

## 2022-11-10 ENCOUNTER — Encounter: Payer: Self-pay | Admitting: Physician Assistant

## 2022-12-04 ENCOUNTER — Other Ambulatory Visit: Payer: Self-pay | Admitting: Oncology

## 2022-12-04 DIAGNOSIS — Z006 Encounter for examination for normal comparison and control in clinical research program: Secondary | ICD-10-CM

## 2023-01-01 ENCOUNTER — Other Ambulatory Visit: Payer: Self-pay | Admitting: Physician Assistant

## 2023-01-02 ENCOUNTER — Other Ambulatory Visit (HOSPITAL_BASED_OUTPATIENT_CLINIC_OR_DEPARTMENT_OTHER): Payer: Self-pay

## 2023-01-02 ENCOUNTER — Other Ambulatory Visit: Payer: Self-pay

## 2023-01-02 MED ORDER — AMLODIPINE BESYLATE 5 MG PO TABS
5.0000 mg | ORAL_TABLET | Freq: Every day | ORAL | 0 refills | Status: DC
  Filled 2023-01-02: qty 30, 30d supply, fill #0
  Filled 2023-01-31 – 2023-02-01 (×2): qty 30, 30d supply, fill #1
  Filled 2023-02-28: qty 30, 30d supply, fill #2

## 2023-01-02 MED ORDER — LEVONORGEST-ETH ESTRAD 91-DAY 0.15-0.03 &0.01 MG PO TABS
1.0000 | ORAL_TABLET | Freq: Every day | ORAL | 0 refills | Status: DC
  Filled 2023-01-02 – 2023-01-18 (×3): qty 91, 91d supply, fill #0

## 2023-01-07 ENCOUNTER — Other Ambulatory Visit (HOSPITAL_BASED_OUTPATIENT_CLINIC_OR_DEPARTMENT_OTHER): Payer: Self-pay

## 2023-01-16 ENCOUNTER — Other Ambulatory Visit (HOSPITAL_BASED_OUTPATIENT_CLINIC_OR_DEPARTMENT_OTHER): Payer: Self-pay

## 2023-01-18 ENCOUNTER — Other Ambulatory Visit (HOSPITAL_BASED_OUTPATIENT_CLINIC_OR_DEPARTMENT_OTHER): Payer: Self-pay

## 2023-02-01 ENCOUNTER — Other Ambulatory Visit (HOSPITAL_BASED_OUTPATIENT_CLINIC_OR_DEPARTMENT_OTHER): Payer: Self-pay

## 2023-02-01 ENCOUNTER — Other Ambulatory Visit: Payer: Self-pay

## 2023-02-04 ENCOUNTER — Other Ambulatory Visit: Payer: Self-pay

## 2023-02-05 ENCOUNTER — Other Ambulatory Visit (HOSPITAL_BASED_OUTPATIENT_CLINIC_OR_DEPARTMENT_OTHER): Payer: Self-pay

## 2023-02-28 ENCOUNTER — Other Ambulatory Visit: Payer: Self-pay

## 2023-02-28 ENCOUNTER — Other Ambulatory Visit (HOSPITAL_COMMUNITY): Payer: Self-pay

## 2023-03-28 ENCOUNTER — Telehealth: Payer: Self-pay

## 2023-03-28 ENCOUNTER — Other Ambulatory Visit: Payer: Self-pay | Admitting: Physician Assistant

## 2023-03-28 NOTE — Telephone Encounter (Signed)
Called and left a voice message per DPR on file asking patient to give the office a call back.   Patient is in need for annual physical for refills of medications. Will try calling patient again

## 2023-03-29 NOTE — Telephone Encounter (Signed)
Patient called back and has been scheduled for 12/9 @ 10:20 am.

## 2023-04-01 ENCOUNTER — Encounter: Payer: Self-pay | Admitting: Physician Assistant

## 2023-04-01 ENCOUNTER — Ambulatory Visit (INDEPENDENT_AMBULATORY_CARE_PROVIDER_SITE_OTHER): Payer: Managed Care, Other (non HMO) | Admitting: Physician Assistant

## 2023-04-01 ENCOUNTER — Other Ambulatory Visit (HOSPITAL_BASED_OUTPATIENT_CLINIC_OR_DEPARTMENT_OTHER): Payer: Self-pay

## 2023-04-01 VITALS — BP 140/76 | HR 100 | Temp 97.5°F | Ht 64.0 in | Wt 155.0 lb

## 2023-04-01 DIAGNOSIS — Z0000 Encounter for general adult medical examination without abnormal findings: Secondary | ICD-10-CM | POA: Diagnosis not present

## 2023-04-01 DIAGNOSIS — Z13220 Encounter for screening for lipoid disorders: Secondary | ICD-10-CM | POA: Diagnosis not present

## 2023-04-01 DIAGNOSIS — R030 Elevated blood-pressure reading, without diagnosis of hypertension: Secondary | ICD-10-CM

## 2023-04-01 DIAGNOSIS — Z136 Encounter for screening for cardiovascular disorders: Secondary | ICD-10-CM | POA: Diagnosis not present

## 2023-04-01 DIAGNOSIS — E663 Overweight: Secondary | ICD-10-CM | POA: Diagnosis not present

## 2023-04-01 DIAGNOSIS — Z3041 Encounter for surveillance of contraceptive pills: Secondary | ICD-10-CM

## 2023-04-01 LAB — LIPID PANEL
Cholesterol: 168 mg/dL (ref 0–200)
HDL: 57 mg/dL (ref >39.00)
LDL Cholesterol: 89 mg/dL (ref 0–99)
NonHDL: 111.44
Total CHOL/HDL Ratio: 3
Triglycerides: 110 mg/dL (ref 0.0–149.0)
VLDL: 22 mg/dL (ref 0.0–40.0)

## 2023-04-01 LAB — CBC WITH DIFFERENTIAL/PLATELET
Basophils Absolute: 0 10*3/uL (ref 0.0–0.1)
Basophils Relative: 0.5 % (ref 0.0–3.0)
Eosinophils Absolute: 0 10*3/uL (ref 0.0–0.7)
Eosinophils Relative: 0.5 % (ref 0.0–5.0)
HCT: 44.3 % (ref 36.0–46.0)
Hemoglobin: 14.8 g/dL (ref 12.0–15.0)
Lymphocytes Relative: 23.5 % (ref 12.0–46.0)
Lymphs Abs: 1.4 10*3/uL (ref 0.7–4.0)
MCHC: 33.4 g/dL (ref 30.0–36.0)
MCV: 96.6 fL (ref 78.0–100.0)
Monocytes Absolute: 0.3 10*3/uL (ref 0.1–1.0)
Monocytes Relative: 5.8 % (ref 3.0–12.0)
Neutro Abs: 4.2 10*3/uL (ref 1.4–7.7)
Neutrophils Relative %: 69.7 % (ref 43.0–77.0)
Platelets: 238 10*3/uL (ref 150.0–400.0)
RBC: 4.59 Mil/uL (ref 3.87–5.11)
RDW: 12.5 % (ref 11.5–15.5)
WBC: 6 10*3/uL (ref 4.0–10.5)

## 2023-04-01 LAB — COMPREHENSIVE METABOLIC PANEL
ALT: 18 U/L (ref 0–35)
AST: 15 U/L (ref 0–37)
Albumin: 4.6 g/dL (ref 3.5–5.2)
Alkaline Phosphatase: 34 U/L — ABNORMAL LOW (ref 39–117)
BUN: 10 mg/dL (ref 6–23)
CO2: 24 meq/L (ref 19–32)
Calcium: 9.4 mg/dL (ref 8.4–10.5)
Chloride: 105 meq/L (ref 96–112)
Creatinine, Ser: 0.71 mg/dL (ref 0.40–1.20)
GFR: 106.9 mL/min (ref >60.00)
Glucose, Bld: 98 mg/dL (ref 70–99)
Potassium: 3.5 meq/L (ref 3.5–5.1)
Sodium: 140 meq/L (ref 135–145)
Total Bilirubin: 0.4 mg/dL (ref 0.2–1.2)
Total Protein: 7.5 g/dL (ref 6.0–8.3)

## 2023-04-01 MED ORDER — AMLODIPINE BESYLATE 5 MG PO TABS
5.0000 mg | ORAL_TABLET | Freq: Every day | ORAL | 3 refills | Status: DC
  Filled 2023-04-01: qty 90, 90d supply, fill #0
  Filled 2023-06-26: qty 90, 90d supply, fill #1
  Filled 2023-10-03: qty 90, 90d supply, fill #2
  Filled 2024-01-22: qty 90, 90d supply, fill #3

## 2023-04-01 MED ORDER — NORETHINDRONE 0.35 MG PO TABS
1.0000 | ORAL_TABLET | Freq: Every day | ORAL | 3 refills | Status: DC
  Filled 2023-04-01: qty 84, 84d supply, fill #0
  Filled 2023-06-20: qty 84, 84d supply, fill #1
  Filled 2023-09-12: qty 84, 84d supply, fill #2
  Filled 2023-12-05: qty 84, 84d supply, fill #3

## 2023-04-01 NOTE — Telephone Encounter (Signed)
Patient was seen in the office today. Medication was discontinued by Jarold Motto, PA

## 2023-04-01 NOTE — Progress Notes (Signed)
Lindsey Sellers is a 37 y.o. female and is here for a comprehensive physical exam.  HPI Chief Complaint  Patient presents with   Annual Exam    Would like to discuss birth control    Acute Concerns: Contraception: Prescribed Daysee 01/02/23.  She reports that her 1st skipped cycle she had no issues, but then by the end of the 2nd month and beginning of 3rd month she began to spot.  States that she experienced this when she was on a similar contraception about a decade ago. Her husband has had a vasectomy, but she would like to continue to prevent any possible pregnancy.   Chronic Issues: HTN Managed/Compliant with 5 mg Amlodipine - request refill.  Monitored at home a couple of times a week with readings of 120s/70s.  Denies excessive caffeine intake, stimulant usage, excessive alcohol intake, or increase in salt consumption.  Denies chest pain, shortness of breath, blurred vision, dizziness, unusual headaches, or lower leg swelling.  BP Readings from Last 3 Encounters:  04/01/23 (!) 144/76  07/23/22 132/85  07/02/22 (!) 150/96   Health Maintenance: Immunizations -- UpToDate Colonoscopy -- N/A Mammogram -- N/A PAP -- Last done 06/23/20. Specimen rejected/not processed. Pap done 08/04/19 was normal with repeat recommendation 2026.  Bone Density -- N/A Diet -- Healthy overall. Exercise -- Regular exercise  Sleep habits -- Good sleep quality Mood -- Stable  UTD with dentist? - Yes UTD with eye doctor? - Yes Established/UTD with dermatology? - Yes  Weight history: Wt Readings from Last 10 Encounters:  04/01/23 155 lb (70.3 kg)  07/02/22 161 lb (73 kg)  06/05/22 160 lb 4 oz (72.7 kg)  12/05/21 168 lb (76.2 kg)  10/10/21 172 lb (78 kg)  07/10/21 169 lb (76.7 kg)  06/12/21 170 lb 8 oz (77.3 kg)  02/03/21 171 lb (77.6 kg)  01/05/21 168 lb (76.2 kg)  01/03/21 171 lb 12.8 oz (77.9 kg)   Body mass index is 26.61 kg/m. No LMP recorded. (Menstrual status: Oral  contraceptives).  Alcohol use:  reports that she does not currently use alcohol after a past usage of about 5.0 standard drinks of alcohol per week.  Tobacco use:  Tobacco Use: Low Risk  (04/01/2023)   Patient History    Smoking Tobacco Use: Never    Smokeless Tobacco Use: Never    Passive Exposure: Not on file   Eligible for lung cancer screening? no     04/01/2023   10:24 AM  Depression screen PHQ 2/9  Decreased Interest 0  Down, Depressed, Hopeless 0  PHQ - 2 Score 0  Altered sleeping 0  Tired, decreased energy 0  Change in appetite 0  Feeling bad or failure about yourself  0  Trouble concentrating 0  Moving slowly or fidgety/restless 0  Suicidal thoughts 0  PHQ-9 Score 0  Difficult doing work/chores Not difficult at all    Other providers/specialists: Patient Care Team: Jarold Motto, [place] as PCP - General (Physician Assistant) Verner Chol, CNM as Referring Physician (Certified Nurse Midwife)   PMHx, SurgHx, SocialHx, Medications, and Allergies were reviewed in the Visit Navigator and updated as appropriate.   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   HPV changes only    Past Surgical History:  Procedure Laterality Date   child birth  03/05/2018   COLPOSCOPY  2008   no biopsy done   DILATION AND CURETTAGE OF UTERUS  09/2020   WISDOM TOOTH EXTRACTION     Family History  Problem Relation Age of Onset   Cancer Paternal Grandmother        lymph node   Gout Father    Colon cancer Neg Hx    Breast cancer Neg Hx    Social History   Tobacco Use   Smoking status: Never   Smokeless tobacco: Never  Vaping Use   Vaping status: Never Used  Substance Use Topics   Alcohol use: Not Currently    Alcohol/week: 5.0 standard drinks of alcohol    Types: 2 Glasses of wine, 3 Standard drinks or equivalent per week   Drug use: No   Review of Systems:   Review of Systems   Constitutional:  Negative for chills, fever, malaise/fatigue and weight loss.  HENT:  Negative for hearing loss, sinus pain and sore throat.   Respiratory:  Negative for cough and hemoptysis.   Cardiovascular:  Negative for chest pain, palpitations, leg swelling and PND.  Gastrointestinal:  Negative for abdominal pain, constipation, diarrhea, heartburn, nausea and vomiting.  Genitourinary:  Negative for dysuria, frequency and urgency.  Musculoskeletal:  Negative for back pain, myalgias and neck pain.  Skin:  Negative for itching and rash.  Neurological:  Negative for dizziness, tingling, seizures and headaches.  Endo/Heme/Allergies:  Negative for polydipsia.  Psychiatric/Behavioral:  Negative for depression. The patient is not nervous/anxious.       Objective:   BP (!) 144/76   Pulse 100   Temp (!) 97.5 F (36.4 C) (Temporal)   Ht 5\' 4"  (1.626 m)   Wt 155 lb (70.3 kg)   SpO2 98%   BMI 26.61 kg/m  Body mass index is 26.61 kg/m.   General Appearance:    Alert, cooperative, no distress, appears stated age  Head:    Normocephalic, without obvious abnormality, atraumatic  Eyes:    PERRL, conjunctiva/corneas clear, EOM's intact, fundi    benign, both eyes  Ears:    Normal TM's and external ear canals, both ears  Nose:   Nares normal, septum midline, mucosa normal, no drainage    or sinus tenderness  Throat:   Lips, mucosa, and tongue normal; teeth and gums normal  Neck:   Supple, symmetrical, trachea midline, no adenopathy;    thyroid:  no enlargement/tenderness/nodules; no carotid   bruit or JVD  Back:     Symmetric, no curvature, ROM normal, no CVA tenderness  Lungs:     Clear to auscultation bilaterally, respirations unlabored  Chest Wall:    No tenderness or deformity   Heart:    Regular rate and rhythm, S1 and S2 normal, no murmur, rub or gallop  Breast Exam:    Deferred   Abdomen:     Soft, non-tender, bowel sounds active all four quadrants,    no masses, no  organomegaly  Genitalia:    Deferred   Extremities:   Extremities normal, atraumatic, no cyanosis or edema  Pulses:   2+ and symmetric all extremities  Skin:   Skin color, texture, turgor normal, no rashes or lesions  Lymph nodes:   Cervical, supraclavicular, and axillary nodes normal  Neurologic:   CNII-XII intact, normal strength, sensation and reflexes    throughout    Assessment/Plan:   Routine physical examination Today patient counseled on age appropriate routine health concerns for screening and prevention, each reviewed and up to date or declined. Immunizations reviewed and up to date or declined. Labs ordered and reviewed. Risk factors  for depression reviewed and negative. Hearing function and visual acuity are intact. ADLs screened and addressed as needed. Functional ability and level of safety reviewed and appropriate. Education, counseling and referrals performed based on assessed risks today. Patient provided with a copy of personalized plan for preventive services.  OCP (oral contraceptive pills) surveillance Will change prescription to Micronor due to hypertension  Side effect(s) and risk of breakthrough bleeding discussed Follow-up in 1 year, sooner if concerns  Elevated BP without diagnosis of hypertension Above goal today No evidence of end-organ damage on my exam Recommend patient monitor home blood pressure at least a few times weekly Continue amlodipine 5 mg daily If home monitoring shows consistent elevation, or any symptom(s) develop, recommend reach out to [name] for further advice on next steps  Encounter for lipid screening for cardiovascular disease Update lipid panel and adjust accordingly  Overweight Continue efforts at diet and exercise  I,Emily Lagle,acting as a scribe for [employer], PA.,have documented all relevant documentation on the behalf of Jarold Motto, PA,as directed by  Jarold Motto, PA while in the presence of Jarold Motto,  [place].  I, Jarold Motto, [place], have reviewed all documentation for this visit. The documentation on 04/01/23 for the exam, diagnosis, procedures, and orders are all accurate and complete.  Jarold Motto, PA-C [facility]

## 2023-04-02 ENCOUNTER — Other Ambulatory Visit (HOSPITAL_BASED_OUTPATIENT_CLINIC_OR_DEPARTMENT_OTHER): Payer: Self-pay

## 2023-04-05 ENCOUNTER — Other Ambulatory Visit (HOSPITAL_BASED_OUTPATIENT_CLINIC_OR_DEPARTMENT_OTHER): Payer: Self-pay

## 2023-04-08 ENCOUNTER — Ambulatory Visit: Payer: Managed Care, Other (non HMO) | Admitting: Physician Assistant

## 2023-04-08 ENCOUNTER — Other Ambulatory Visit (HOSPITAL_BASED_OUTPATIENT_CLINIC_OR_DEPARTMENT_OTHER): Payer: Self-pay

## 2023-04-08 VITALS — BP 110/80 | HR 118 | Temp 98.2°F | Ht 64.0 in | Wt 153.0 lb

## 2023-04-08 DIAGNOSIS — J029 Acute pharyngitis, unspecified: Secondary | ICD-10-CM | POA: Diagnosis not present

## 2023-04-08 LAB — POCT RAPID STREP A (OFFICE): Rapid Strep A Screen: NEGATIVE

## 2023-04-08 MED ORDER — FLUCONAZOLE 150 MG PO TABS
150.0000 mg | ORAL_TABLET | Freq: Once | ORAL | 0 refills | Status: AC
  Filled 2023-04-08: qty 1, 1d supply, fill #0

## 2023-04-08 MED ORDER — PREDNISONE 50 MG PO TABS
50.0000 mg | ORAL_TABLET | Freq: Every day | ORAL | 0 refills | Status: DC
  Filled 2023-04-08: qty 5, 5d supply, fill #0

## 2023-04-08 MED ORDER — AMOXICILLIN-POT CLAVULANATE 875-125 MG PO TABS
1.0000 | ORAL_TABLET | Freq: Two times a day (BID) | ORAL | 0 refills | Status: DC
Start: 2023-04-08 — End: 2023-09-26
  Filled 2023-04-08: qty 20, 10d supply, fill #0

## 2023-04-08 NOTE — Progress Notes (Signed)
Lindsey Sellers is a 37 y.o. female here for a new problem.  History of Present Illness:   Chief Complaint  Patient presents with   Sore Throat    Pt c/o sore throat and fever started on Friday.    HPI  Sore Throat: Complains of sore throat (right-sided) and fever that started Friday, 12/13.  Reports that her sore throat has been only right sided, and her fever has been fluctuating - 103 this morning at-home, but was 98.2 in-office.  Strep test done in-office was NEG.  Endorses chills.  Denies ear pain.   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   HPV changes only    Social History   Tobacco Use   Smoking status: Never   Smokeless tobacco: Never  Vaping Use   Vaping status: Never Used  Substance Use Topics   Alcohol use: Not Currently    Alcohol/week: 5.0 standard drinks of alcohol    Types: 2 Glasses of wine, 3 Standard drinks or equivalent per week   Drug use: No   Past Surgical History:  Procedure Laterality Date   child birth  03/05/2018   COLPOSCOPY  2008   no biopsy done   DILATION AND CURETTAGE OF UTERUS  09/2020   WISDOM TOOTH EXTRACTION     Family History  Problem Relation Age of Onset   Cancer Paternal Grandmother        lymph node   Gout Father    Colon cancer Neg Hx    Breast cancer Neg Hx    No Known Allergies Current Medications:   Current Outpatient Medications:    amLODipine (NORVASC) 5 MG tablet, Take 1 tablet (5 mg total) by mouth daily., Disp: 90 tablet, Rfl: 3   fexofenadine (ALLEGRA) 180 MG tablet, Take 180 mg by mouth as needed for rhinitis., Disp: , Rfl:    Multiple Vitamin (MULTI VITAMIN PO), Take 1 Capful by mouth daily., Disp: , Rfl:    norethindrone (ORTHO MICRONOR) 0.35 MG tablet, Take 1 tablet (0.35 mg total) by mouth daily., Disp: 84 tablet, Rfl: 3  Review of Systems:   ROS See pertinent positives and negatives as per the HPI.  Vitals:    Vitals:   04/08/23 1129  BP: 110/80  Pulse: (!) 118  Temp: 98.2 F (36.8 C)  TempSrc: Temporal  SpO2: 98%  Weight: 153 lb (69.4 kg)  Height: 5\' 4"  (1.626 m)     Body mass index is 26.26 kg/m.  Physical Exam:   Physical Exam Vitals and nursing note reviewed.  Constitutional:      General: She is not in acute distress.    Appearance: She is well-developed. She is not ill-appearing or toxic-appearing.  HENT:     Head: Normocephalic and atraumatic.     Right Ear: Tympanic membrane, ear canal and external ear normal. Tympanic membrane is not erythematous, retracted or bulging.     Left Ear: Tympanic membrane, ear canal and external ear normal. Tympanic membrane is not erythematous, retracted or bulging.     Nose: Nose normal.     Right Sinus: No maxillary sinus tenderness or frontal sinus tenderness.     Left Sinus: No maxillary sinus tenderness or frontal sinus tenderness.     Mouth/Throat:     Pharynx: Uvula midline. Posterior oropharyngeal erythema present.     Tonsils: Tonsillar exudate present. 2+ on the right. 1+ on the left.  Eyes:  General: Lids are normal.     Conjunctiva/sclera: Conjunctivae normal.  Neck:     Trachea: Trachea normal.  Cardiovascular:     Rate and Rhythm: Normal rate and regular rhythm.     Heart sounds: Normal heart sounds, S1 normal and S2 normal.  Pulmonary:     Effort: Pulmonary effort is normal.     Breath sounds: Normal breath sounds. No decreased breath sounds, wheezing, rhonchi or rales.  Lymphadenopathy:     Cervical: No cervical adenopathy.  Skin:    General: Skin is warm and dry.  Neurological:     Mental Status: She is alert.  Psychiatric:        Speech: Speech normal.        Behavior: Behavior normal. Behavior is cooperative.    Results for orders placed or performed in visit on 04/08/23  POCT rapid strep A  Result Value Ref Range   Rapid Strep A Screen Negative Negative    Assessment and Plan:   Sore throat No  red flags on exam.   Will initiate prednisone and augmentin per orders.  Discussed that if unilateral symptom(s) worsen -- LOW THRESHOLD to get CT scan for further evaluation, as I cannot rule out a forming abscess in her right tonsil  Discussed taking medications as prescribed.  Reviewed return precautions including new or worsening fever, SOB, new or worsening cough or other concerns.  Push fluids and rest.  I recommend that patient follow-up if symptoms worsen or persist despite treatment x 7-10 days, sooner if needed.   I,Emily Lagle,acting as a Neurosurgeon for [employer], PA.,have documented all relevant documentation on the behalf of Jarold Motto, PA,as directed by  Jarold Motto, PA while in the presence of Jarold Motto, [place].  I, Jarold Motto, [place], have reviewed all documentation for this visit. The documentation on 04/08/23 for the exam, diagnosis, procedures, and orders are all accurate and complete.  Jarold Motto, PA-C

## 2023-04-08 NOTE — Patient Instructions (Signed)
It was great to see you!  Start Augmentin and prednisone TYLENOL for fevers; avoid ibuprofen while taking prednisone  If worsening right-sided throat pain -- please let me know or go to the ER   Take care,  Jarold Motto PA-C

## 2023-05-01 IMAGING — US US MFM MCA DOPPLER
1 series · 14 of 21 positions shown · non-contrast
Comparison: none

[Series 1: us mfm mca doppler · 14 of 21 slices shown]
[im 1/21]
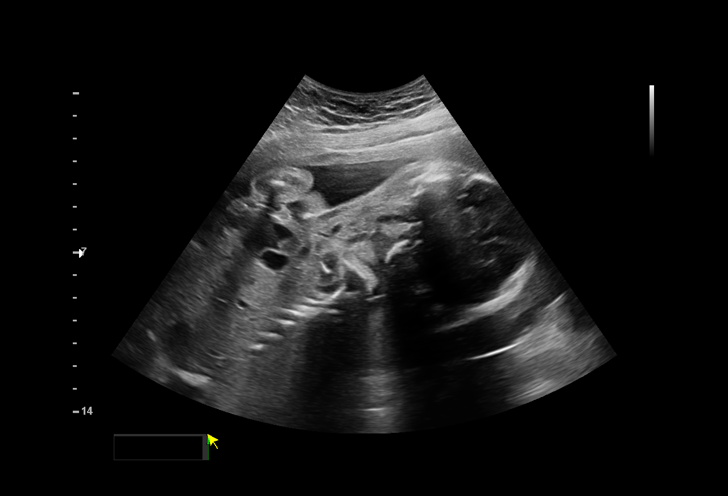
[im 3/21]
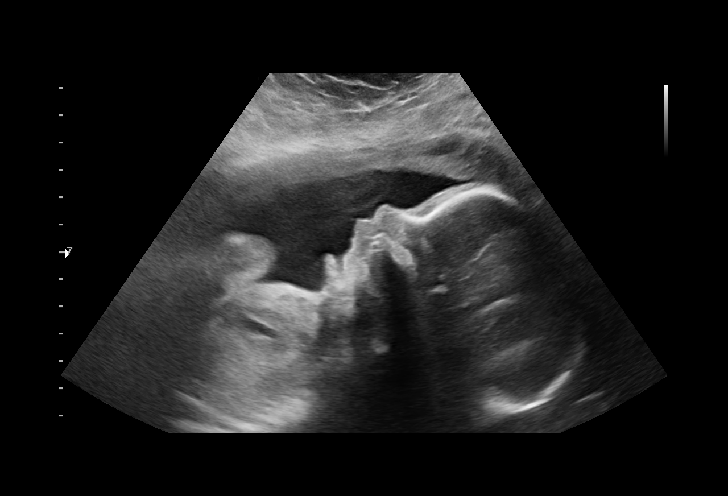
[im 4/21]
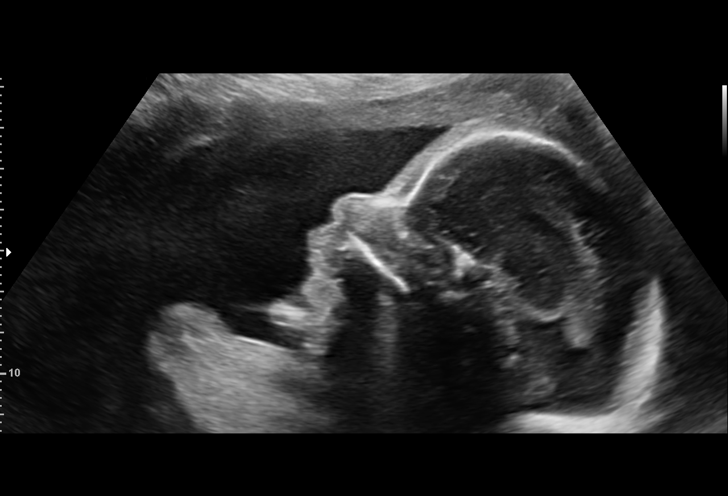
[im 6/21]
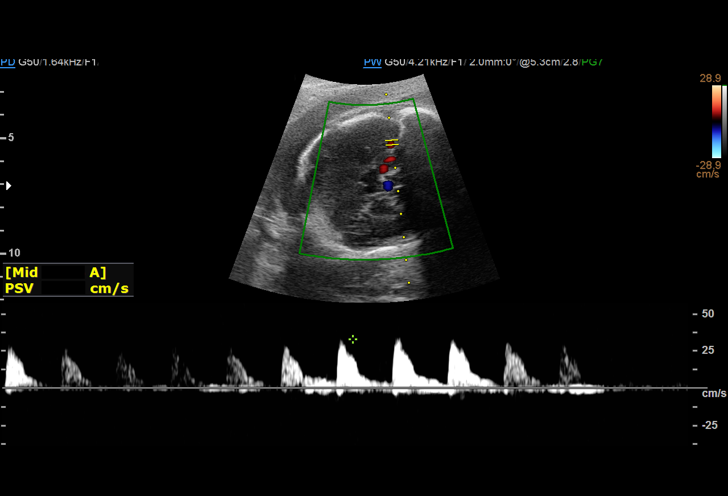
[im 7/21]
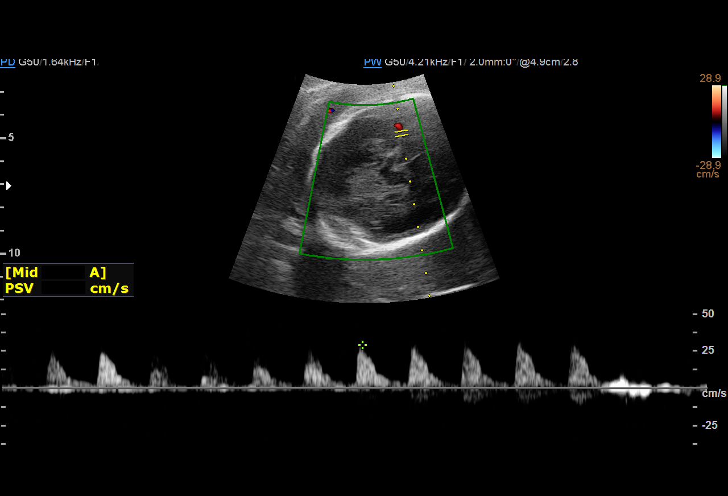
[im 9/21]
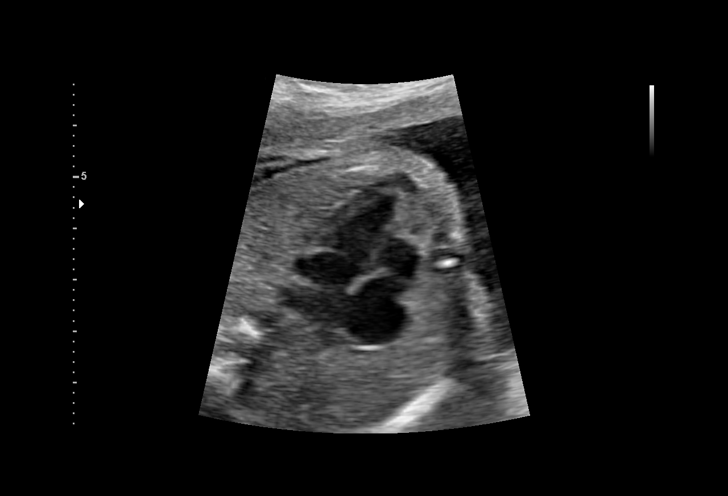
[im 10/21]
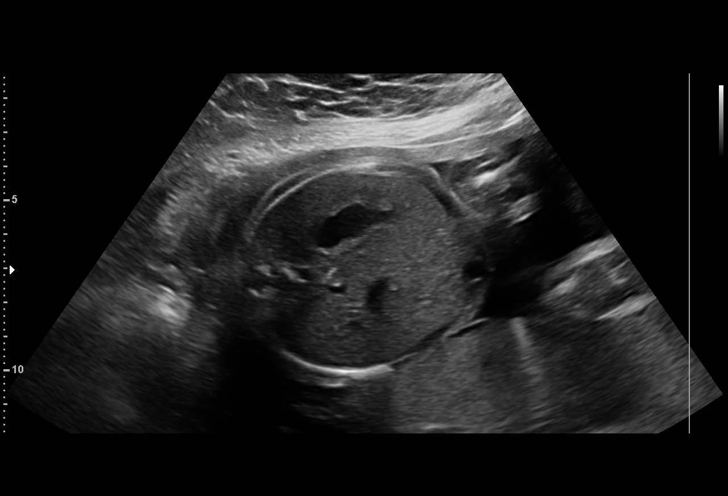
[im 12/21]
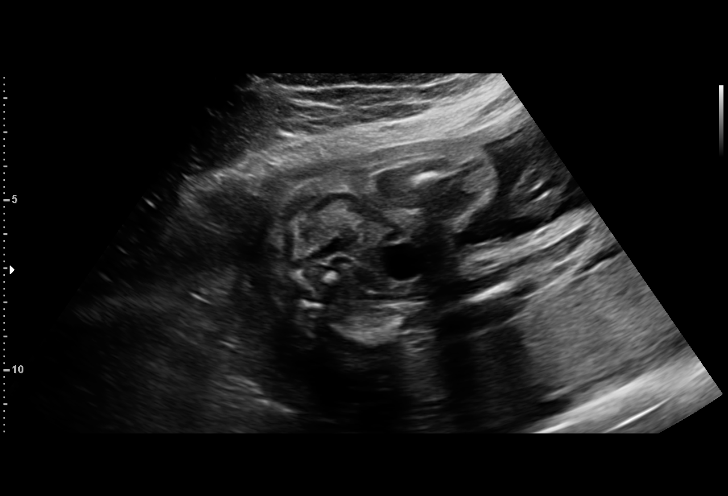
[im 13/21]
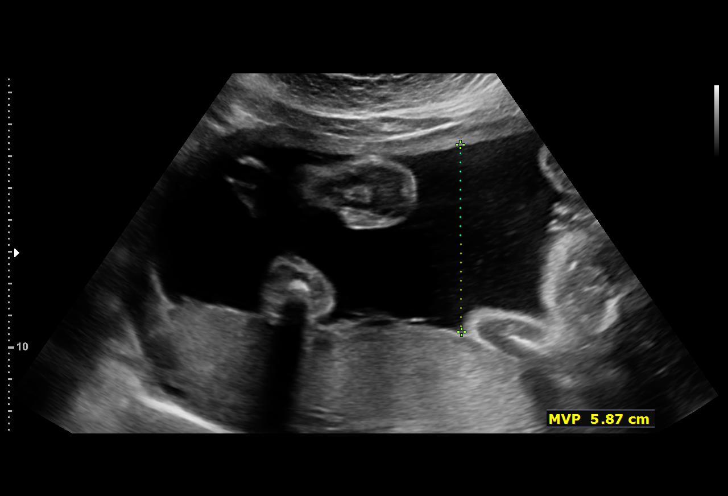
[im 15/21]
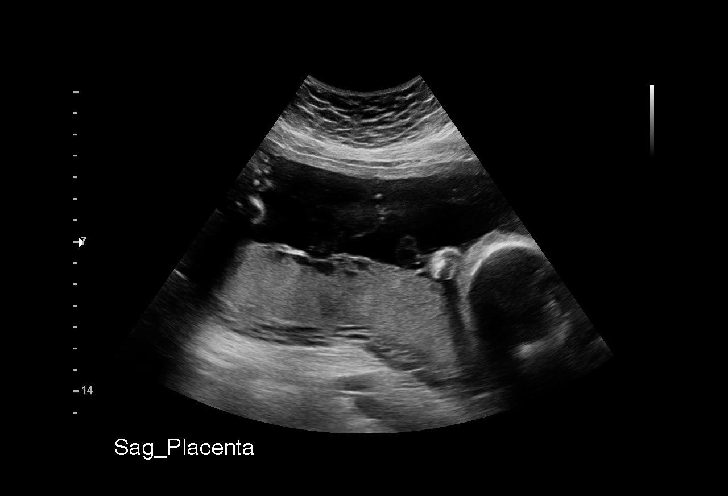
[im 16/21]
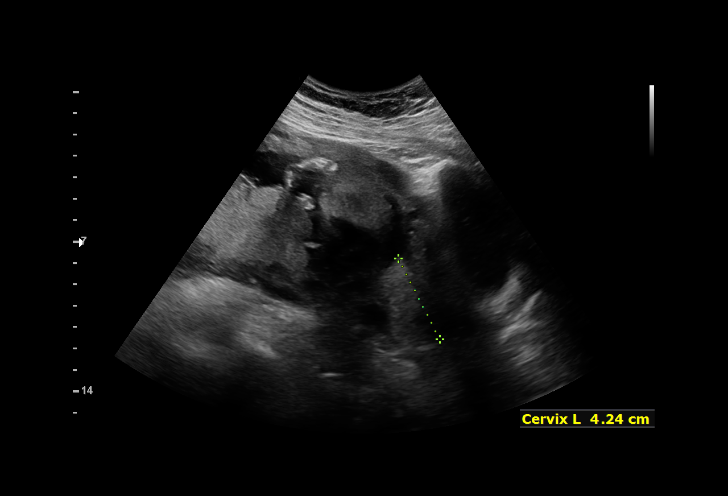
[im 18/21]
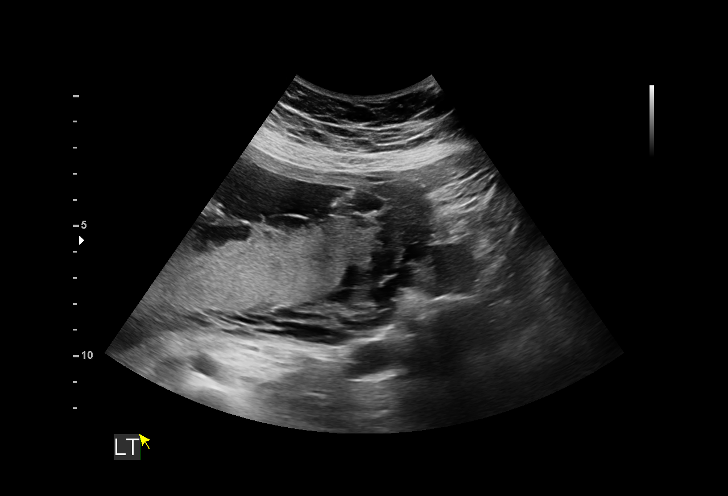
[im 19/21]
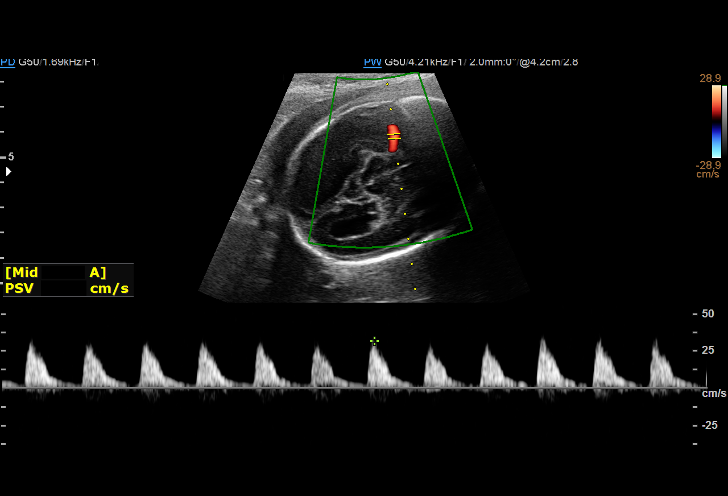
[im 21/21]
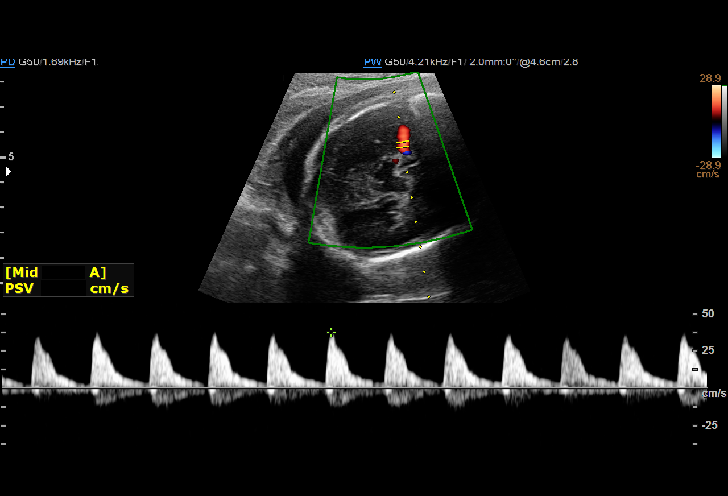

[14 of 21 positions shown; findings below may reference images not displayed]

Indications

 25 weeks gestation of pregnancy
 Adarsh isoimmunization during pregnancy in
 second trimester, single or unspecified fetus
 (both patient and husband are Adarsh antigen
 negative)
 Advanced maternal age multigravida 35+,
 second trimester
 Prior poor obstetrical history antepartum,
 second trimester (prev D&E for T21 and
 cystic hygroma)
 Low Risk NIPS (female); AFP negative
 Isoimmunization - Rh (anti-c antibodies)
Fetal Evaluation

 Num Of Fetuses:         1
 Fetal Heart Rate(bpm):  129
 Cardiac Activity:       Observed
 Presentation:           Cephalic
 Placenta:               Posterior
 P. Cord Insertion:      Previously Visualized

 Amniotic Fluid
 AFI FV:      Within normal limits

                             Largest Pocket(cm)

Biometry
 LV:        6.1  mm
OB History

 Gravidity:    3         Term:   1         SAB:   1
 Living:       1
Gestational Age

 LMP:           25w 1d        Date:  04/11/20                 EDD:   01/16/21
 Best:          25w 1d     Det. By:  LMP  (04/11/20)          EDD:   01/16/21
Anatomy

 Ventricles:            Appears normal         Kidneys:                Appear normal
 Heart:                 Appears normal         Bladder:                Appears normal
                        (4CH, axis, and
                        situs)
 Stomach:               Appears normal, left
                        sided

 Other:  A full anatomical study was previously performed.
Doppler - Fetal Vessels

 Middle Cerebral Artery
                                                      PSV   MoM
                                                    (cm/s)


Cervix Uterus Adnexa

 Cervix
 Length:           4.24  cm.
 Normal appearance by transabdominal scan.

 Right Ovary
 Within normal limits.

 Left Ovary
 Within normal limits.
Impression

 Ms. Wasson is here for MCA dopplers screening as she has
 positive Adarsh antibodies (reports show mother and FOB is [place]
 neg) however she has Anti-c antibodies.
 Today we observed normal MCA of 1.03 MoM, therefore no
 increased risk for anemia.
 There was good fetal movement and amniotic fluid.  No
 evidence of fetal hydrops.
Recommendations

 Growth and MCA's in 2 weeks
 MCA's and amniotic fluid volume in 4 weeks.

## 2023-06-12 IMAGING — US US MFM OB FOLLOW-UP
1 series · 13 of 28 positions shown · non-contrast
Comparison: none

[Series 1: us mfm ob follow-up · 47 acquisitions, 13 frames shown]
[im 2/47]
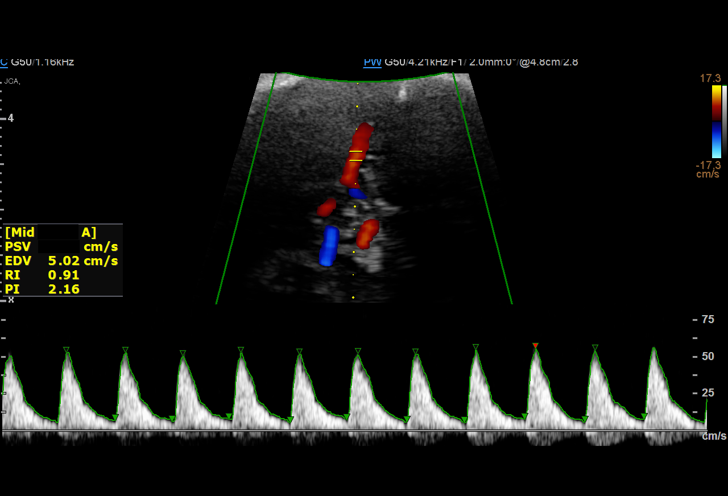
[im 6/47]
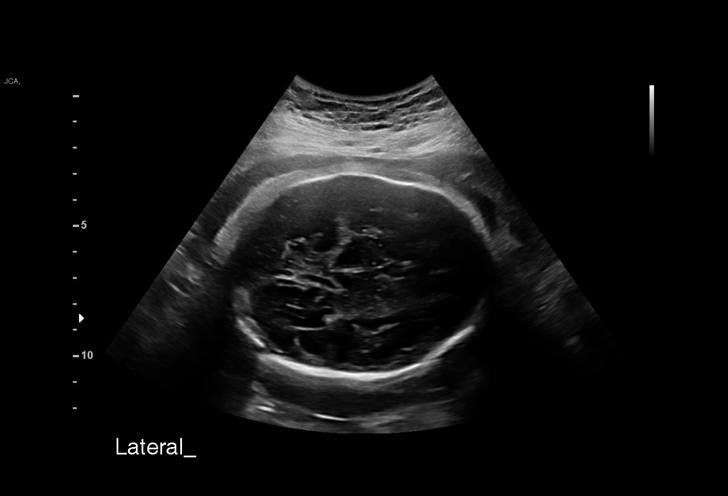
[im 9/47]
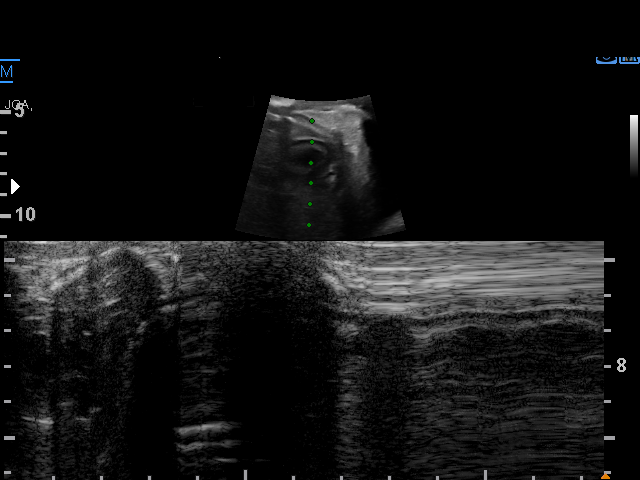
[im 12/47]
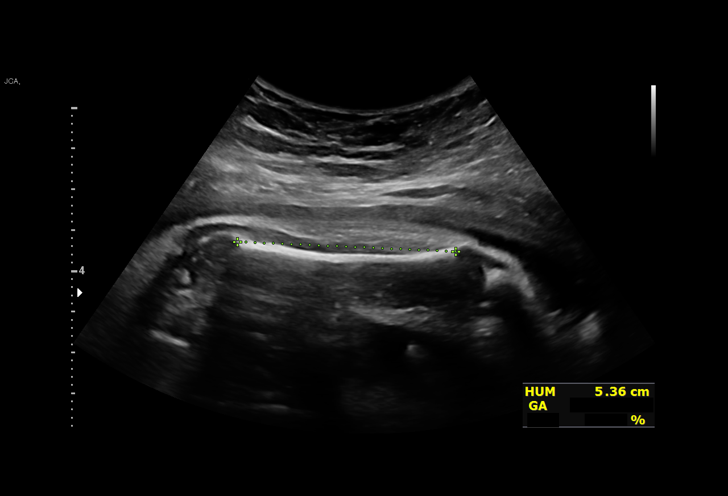
[im 16/47]
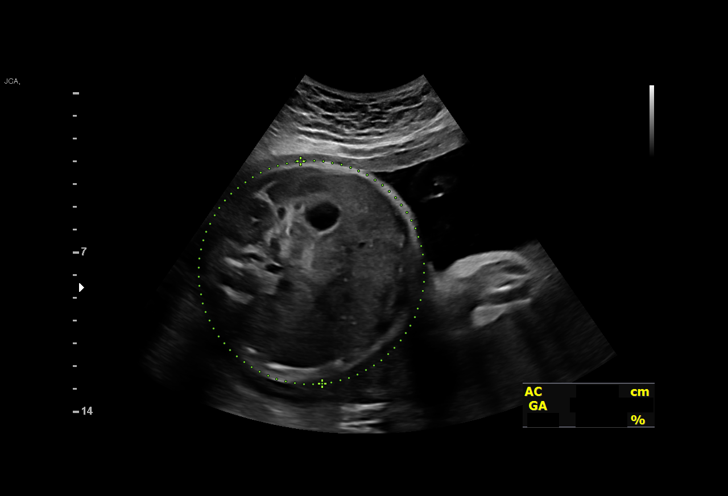
[im 19/47]
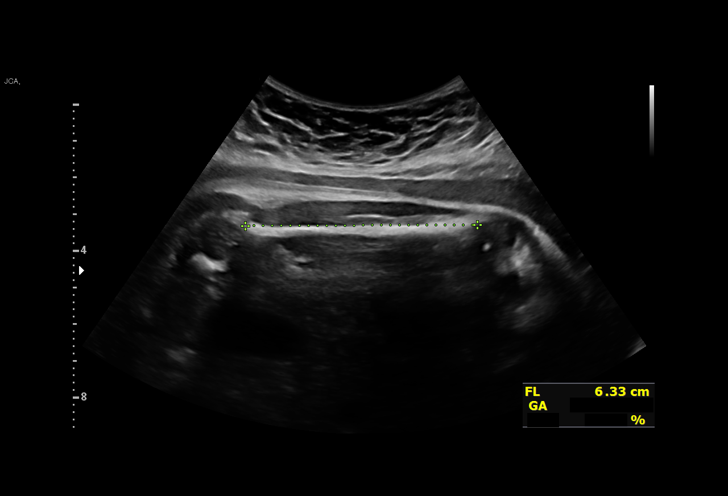
[im 24/47]
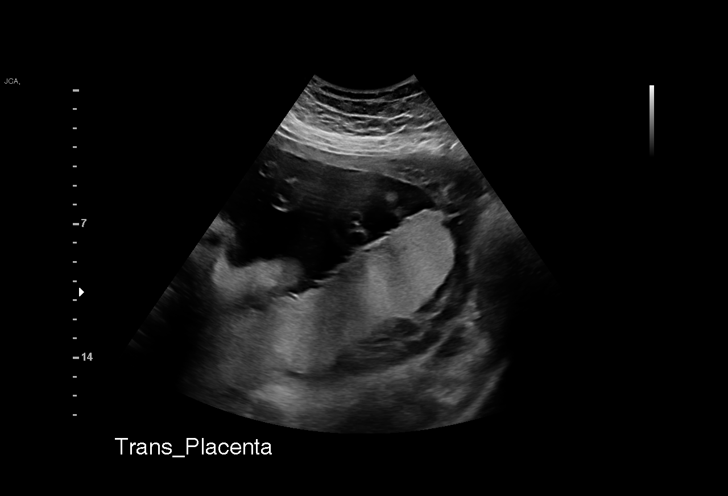
[im 28/47]
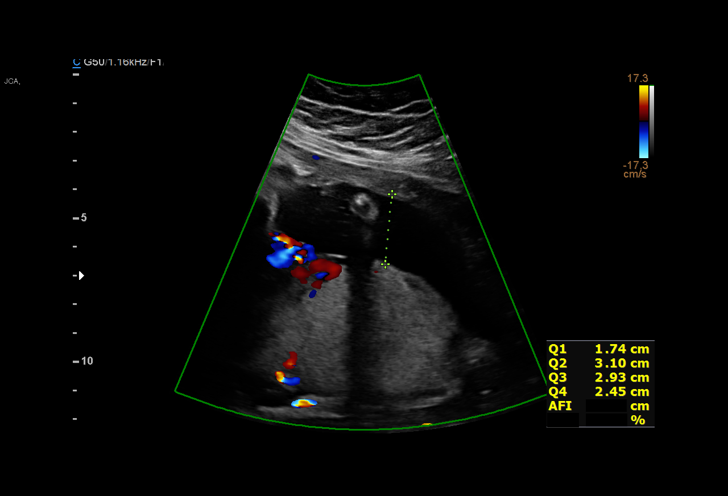
[im 31/47]
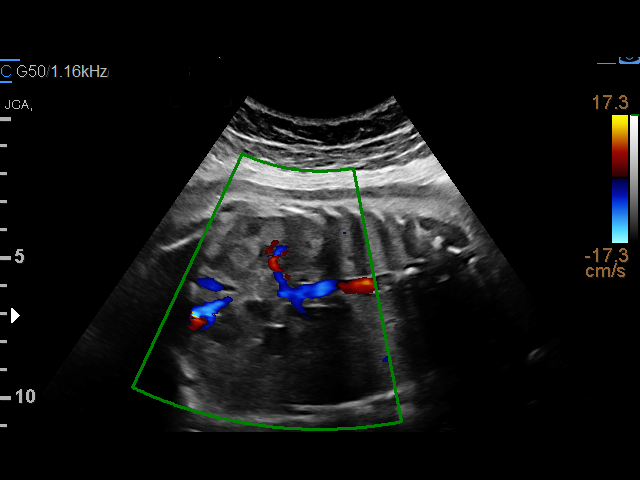
[im 35/47]
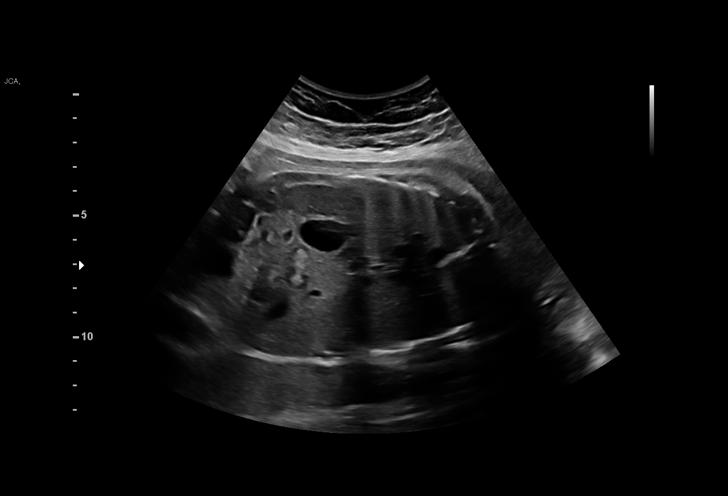
[im 38/47]
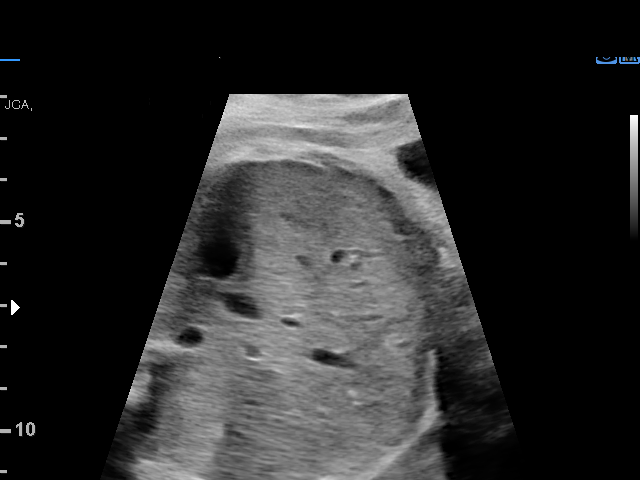
[im 41/47]
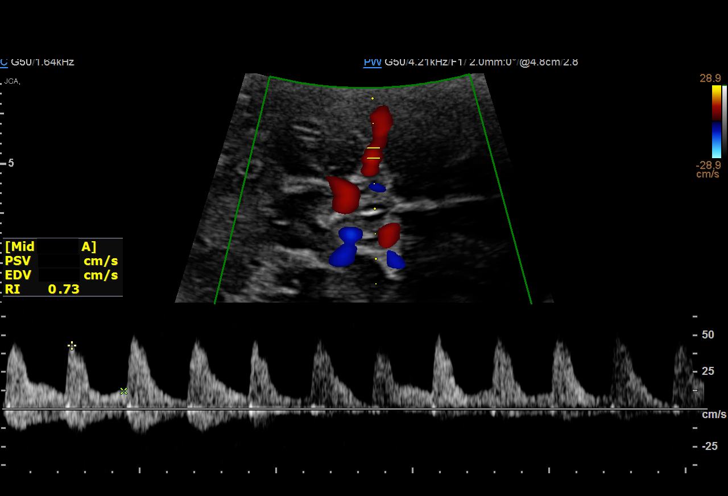
[im 45/47]
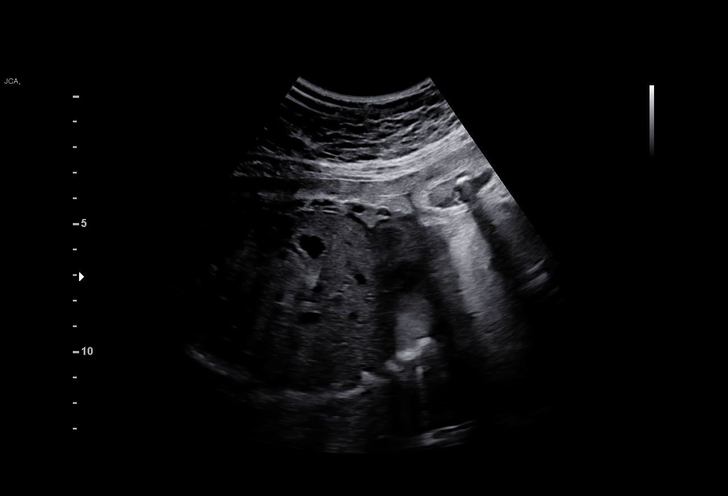

[13 of 28 positions shown; findings below may reference images not displayed]

Indications

 De Fre Intelligen isoimmunization during pregnancy in
 third trimester, fetus 1 of multiple gestation
 Isoimmunization - (anti-c antibodies)
 Prior poor obstetrical history antepartum,
 second trimester (prev D&E for T21 and
 cystic hygroma)
 Advanced maternal age multigravida 35+,
 third trimester
 Low Risk NIPS (female); AFP negative
 31 weeks gestation of pregnancy
Fetal Evaluation

 Num Of Fetuses:         1
 Fetal Heart Rate(bpm):  155
 Cardiac Activity:       Observed
 Presentation:           Cephalic
 Placenta:               Posterior
 P. Cord Insertion:      Visualized

 Amniotic Fluid
 AFI FV:      Within normal limits

 AFI Sum(cm)     %Tile       Largest Pocket(cm)
 10.22           17

 RUQ(cm)       RLQ(cm)       LUQ(cm)        LLQ(cm)

Biometry

 BPD:        78  mm     G. Age:  31w 2d         44  %    CI:        69.21   %    70 - 86
                                                         FL/HC:      21.4   %    19.3 -
 HC:      299.4  mm     G. Age:  33w 1d         70  %    HC/AC:      0.99        0.96 -
 AC:      303.1  mm     G. Age:  34w 2d       > 99  %    FL/BPD:     82.1   %    71 - 87
 FL:         64  mm     G. Age:  33w 0d         85  %    FL/AC:      21.1   %    20 - 24
 HUM:      53.7  mm     G. Age:  31w 2d         55  %

 LV:          7  mm

 Est. FW:    9970  gm    4 lb 14 oz      97  %
OB History

 Gravidity:    3         Term:   1         SAB:   1
 Living:       1
Gestational Age

 LMP:           31w 1d        Date:  04/11/20                 EDD:   01/16/21
 U/S Today:     33w 0d                                        EDD:   01/03/21
 Best:          31w 1d     Det. By:  LMP  (04/11/20)          EDD:   01/16/21
Anatomy



 Other:  Nasal bone, Lenses, VC, 3VV and 3VTV visualized previously.
         Heels/feet and open hands visualized previously.
Doppler - Fetal Vessels

 Middle Cerebral Artery
  S/D                                                 PSV   MoM
                                                    (cm/s)


Comments

 This patient has been followed due to isoimmunization.  She
 has tested positive for the [place] and anti-little c antibodies.
 Her most recent antibody titer levels from September 2020 showed
 Sndzana Birdjan antibody titer level of [DATE].  This value has been
 stable for the past 3 months.  The anti-c antibody titer level
 was not detected during her most recent blood test from
 [facility].  She denies any problems since her last exam and
 reports that she has screened negative for gestational
 diabetes.
 The fetal growth measures large for her gestational [AGE]th
 percentile).  There was normal amniotic fluid noted.
 The peak systolic velocity of the middle cerebral artery was
 less than 1.5 multiple of the median for her gestational age,
 indicating that her fetus is not anemic at this time.
 There were no signs of fetal hydrops noted on today's exam.
 The patient was reassured by today's findings.
Recommendations

 She will return in 2 weeks for another MCA Doppler study.

 As her husband is negative for the De Fre Intelligen antigen and her anti-
 little C antibodies were undetectable, delivery may may occur
 at between 39-40 weeks as long as the MCA Doppler studies
 do not indicate fetal anemia.

## 2023-07-05 ENCOUNTER — Other Ambulatory Visit (HOSPITAL_BASED_OUTPATIENT_CLINIC_OR_DEPARTMENT_OTHER): Payer: Self-pay

## 2023-09-05 ENCOUNTER — Other Ambulatory Visit: Payer: Self-pay | Admitting: Physician Assistant

## 2023-09-05 DIAGNOSIS — Z1231 Encounter for screening mammogram for malignant neoplasm of breast: Secondary | ICD-10-CM

## 2023-09-09 ENCOUNTER — Ambulatory Visit (HOSPITAL_BASED_OUTPATIENT_CLINIC_OR_DEPARTMENT_OTHER)
Admission: RE | Admit: 2023-09-09 | Discharge: 2023-09-09 | Disposition: A | Discharge status: Home / Self Care | Source: Ambulatory Visit

## 2023-09-09 ENCOUNTER — Encounter (HOSPITAL_BASED_OUTPATIENT_CLINIC_OR_DEPARTMENT_OTHER): Payer: Self-pay | Admitting: Radiology

## 2023-09-09 DIAGNOSIS — Z1231 Encounter for screening mammogram for malignant neoplasm of breast: Secondary | ICD-10-CM | POA: Insufficient documentation

## 2023-09-19 ENCOUNTER — Other Ambulatory Visit

## 2023-09-19 DIAGNOSIS — Z006 Encounter for examination for normal comparison and control in clinical research program: Secondary | ICD-10-CM

## 2023-09-26 ENCOUNTER — Other Ambulatory Visit (HOSPITAL_COMMUNITY)
Admission: RE | Admit: 2023-09-26 | Discharge: 2023-09-26 | Disposition: A | Discharge status: Home / Self Care | Source: Ambulatory Visit | Attending: Obstetrics and Gynecology | Admitting: Obstetrics and Gynecology

## 2023-09-26 ENCOUNTER — Encounter: Payer: Self-pay | Admitting: Obstetrics and Gynecology

## 2023-09-26 ENCOUNTER — Ambulatory Visit: Admitting: Obstetrics and Gynecology

## 2023-09-26 VITALS — BP 127/85 | HR 96 | Ht 64.0 in | Wt 155.0 lb

## 2023-09-26 DIAGNOSIS — Z01419 Encounter for gynecological examination (general) (routine) without abnormal findings: Secondary | ICD-10-CM | POA: Diagnosis present

## 2023-09-26 NOTE — Progress Notes (Signed)
   ANNUAL EXAM Patient name: Lindsey Sellers MRN [id number]  Date of birth: 06-09-1985 Chief Complaint:   Gynecologic Exam  History of Present Illness:   Lindsey Sellers is a 38 y.o. [id number] female being seen today for a routine annual exam.   Current concerns: None Current birth control: POP  Patient's last menstrual period was 09/03/2023.   Last MXR: 08/2023, dense birad 1 Last Pap/Pap History:  2021: pap/hpv wnl  Review of Systems:   Pertinent items are noted in HPI Denies any headaches, blurred vision, fatigue, shortness of breath, chest pain, abdominal pain, abnormal vaginal discharge/itching/odor/irritation, problems with periods, bowel movements, urination, or intercourse unless otherwise stated above.  Pertinent History Reviewed:  Reviewed past medical,surgical, social and family history.  Reviewed problem list, medications and allergies. Physical Assessment:   Vitals:   09/26/23 0931 09/26/23 0935  BP: (!) 138/91 127/85  Pulse: 96 96  Weight: 155 lb (70.3 kg)   Height: 5\' 4"  (1.626 m)   Body mass index is 26.61 kg/m.   Physical Examination:  General appearance - well appearing, and in no distress Mental status - alert, oriented to person, place, and time Psych:  She has a normal mood and affect Skin - warm and dry, normal color, no suspicious lesions noted Chest - effort normal Heart - normal rate  Breasts - breasts appear normal, no suspicious masses, no skin or nipple changes or axillary nodes Abdomen - soft, nontender, nondistended, no masses or organomegaly Pelvic -  Performed and: VULVA: normal appearing vulva with no masses, tenderness or lesions  VAGINA: normal appearing vagina with normal color and discharge, no lesions  CERVIX: normal appearing cervix without discharge or lesions, no CMT UTERUS: Not examined ADNEXA: Not examined Extremities:  No swelling or varicosities noted  Chaperone present for exam  No results found for this or any  previous visit (from the past 24 hours).  Assessment & Plan:  Lindsey Sellers was seen today for gynecologic exam.  Diagnoses and all orders for this visit:  Encounter for annual routine gynecological examination -     Cytology - PAP( [facility])  - Cervical cancer screening: Discussed guidelines. Pap with HPV done - STD Testing: not indicated - Birth Control: POP - Breast Health: Encouraged self breast awareness/SBE. Teaching provided. Discussed limits of clinical breast exam for detecting breast cancer. MXR is up to date: 08/2023 - F/U 12-24 months and prn  No orders of the defined types were placed in this encounter.   Meds: No orders of the defined types were placed in this encounter.   Follow-up: Return in about 2 years (around 09/25/2025) for annual.  Lacey Pian, MD 09/26/2023 9:53 AM

## 2023-09-27 LAB — GENECONNECT MOLECULAR SCREEN: Genetic Analysis Overall Interpretation: NEGATIVE

## 2023-10-01 ENCOUNTER — Ambulatory Visit: Payer: Self-pay | Admitting: Obstetrics and Gynecology

## 2023-10-01 LAB — CYTOLOGY - PAP
Comment: NEGATIVE
Diagnosis: NEGATIVE
High risk HPV: NEGATIVE

## 2023-11-18 ENCOUNTER — Ambulatory Visit: Admitting: Dermatology

## 2023-11-27 ENCOUNTER — Encounter: Payer: Self-pay | Admitting: Dermatology

## 2023-11-27 ENCOUNTER — Ambulatory Visit: Admitting: Dermatology

## 2023-11-27 VITALS — BP 154/101

## 2023-11-27 DIAGNOSIS — Z1283 Encounter for screening for malignant neoplasm of skin: Secondary | ICD-10-CM | POA: Diagnosis not present

## 2023-11-27 DIAGNOSIS — L578 Other skin changes due to chronic exposure to nonionizing radiation: Secondary | ICD-10-CM

## 2023-11-27 DIAGNOSIS — L814 Other melanin hyperpigmentation: Secondary | ICD-10-CM

## 2023-11-27 DIAGNOSIS — D1801 Hemangioma of skin and subcutaneous tissue: Secondary | ICD-10-CM

## 2023-11-27 DIAGNOSIS — W908XXA Exposure to other nonionizing radiation, initial encounter: Secondary | ICD-10-CM

## 2023-11-27 DIAGNOSIS — L821 Other seborrheic keratosis: Secondary | ICD-10-CM | POA: Diagnosis not present

## 2023-11-27 DIAGNOSIS — D229 Melanocytic nevi, unspecified: Secondary | ICD-10-CM

## 2023-11-27 NOTE — Progress Notes (Unsigned)
   Total Body Skin Exam (TBSE) Visit   Subjective  Lindsey Sellers is a 38 y.o. female ESTABLISHED PATIENT who presents for the following:  Total Body Skin Exam (TBSE)  Patient does not have spots of concern to be evaluated. She does apply sunscreen and/or wears protective coverings. Reports Hx of Bx - benign. Denied  family Hx of skin cancers.   The patient has spots, moles and lesions to be evaluated, some may be new or changing and the patient has concerns that these could be cancer.  The following portions of the chart were reviewed this encounter and updated as appropriate: medications, allergies, medical history  Review of Systems:  No other skin or systemic complaints except as noted in HPI or Assessment and Plan.  Objective  Well appearing patient in no apparent distress; mood and affect are within normal limits.  A full examination was performed including scalp, head, eyes, ears, nose, lips, neck, chest, axillae, abdomen, back, buttocks, bilateral upper extremities, bilateral lower extremities, hands, feet, fingers, toes, fingernails, and toenails. All findings within normal limits unless otherwise noted below.   Relevant physical exam findings are noted in the Assessment and Plan.    Assessment & Plan   LENTIGINES, SEBORRHEIC KERATOSES, HEMANGIOMAS - Benign normal skin lesions - Benign-appearing - Call for any changes  MELANOCYTIC NEVI - Tan-brown and/or pink-flesh-colored symmetric macules and papules - Benign appearing on exam today - Observation - Call clinic for new or changing moles - Recommend daily use of broad spectrum spf 30+ sunscreen to sun-exposed areas.   ACTINIC DAMAGE - Chronic condition, secondary to cumulative UV/sun exposure - diffuse scaly erythematous macules with underlying dyspigmentation - Recommend daily broad spectrum sunscreen SPF 30+ to sun-exposed areas, reapply every 2 hours as needed.  - Staying in the shade or wearing long  sleeves, sun glasses (UVA+UVB protection) and wide brim hats (4-inch brim around the entire circumference of the hat) are also recommended for sun protection.  - Call for new or changing lesions.   SKIN CANCER SCREENING PERFORMED TODAY.    No follow-ups on file.   Documentation: I have reviewed the above documentation for accuracy and completeness, and I agree with the above.  I, Shirron Maranda, CMA, am acting as scribe for [employer], DO.   Delon Lenis, DO

## 2023-11-27 NOTE — Patient Instructions (Signed)

## 2024-02-25 ENCOUNTER — Other Ambulatory Visit: Payer: Self-pay | Admitting: Physician Assistant

## 2024-02-25 ENCOUNTER — Other Ambulatory Visit: Payer: Self-pay

## 2024-02-25 ENCOUNTER — Other Ambulatory Visit (HOSPITAL_BASED_OUTPATIENT_CLINIC_OR_DEPARTMENT_OTHER): Payer: Self-pay

## 2024-02-25 MED ORDER — AMLODIPINE BESYLATE 5 MG PO TABS
5.0000 mg | ORAL_TABLET | Freq: Every day | ORAL | 3 refills | Status: AC
  Filled 2024-02-25 – 2024-04-19 (×2): qty 90, 90d supply, fill #0

## 2024-02-25 MED ORDER — NORETHINDRONE 0.35 MG PO TABS
1.0000 | ORAL_TABLET | Freq: Every day | ORAL | 3 refills | Status: AC
  Filled 2024-02-25: qty 84, 84d supply, fill #0
  Filled 2024-05-21: qty 84, 84d supply, fill #1

## 2024-04-03 ENCOUNTER — Encounter: Payer: Self-pay | Admitting: Physician Assistant

## 2024-04-03 ENCOUNTER — Ambulatory Visit: Payer: Managed Care, Other (non HMO) | Admitting: Physician Assistant

## 2024-04-03 VITALS — BP 138/86 | HR 92 | Temp 97.4°F | Ht 64.0 in | Wt 156.0 lb

## 2024-04-03 DIAGNOSIS — Z0000 Encounter for general adult medical examination without abnormal findings: Secondary | ICD-10-CM

## 2024-04-03 DIAGNOSIS — Z3041 Encounter for surveillance of contraceptive pills: Secondary | ICD-10-CM

## 2024-04-03 DIAGNOSIS — I10 Essential (primary) hypertension: Secondary | ICD-10-CM

## 2024-04-03 NOTE — Progress Notes (Signed)
 Subjective:    Lindsey Sellers is a 38 y.o. female and is here for a comprehensive physical exam.  HPI  There are no preventive care reminders to display for this patient.  Discussed the use of AI scribe software for clinical note transcription with the patient, who gave verbal consent to proceed.  History of Present Illness   Lindsey Sellers is a 38 year old female who presents for a routine check-up and stress management discussion.  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 She is considering a role change to improve her physical and mental health.  She eats vegetables and avoids fried foods but is not exercising regularly due to her schedule. She is working on losing remaining postpartum weight and knows she needs more physical activity.  Her Pap smear and mammogram are up to date, with the last mammogram in April. Her menstrual cycles and birth control pills are working well without concerns.  She denies headaches, neurologic symptoms, leg pain or swelling, and has no issues with her current medications.       Health Maintenance: Immunizations -- UpToDate  Colonoscopy -- n/a Mammogram -- UpToDate  PAP -- UpToDate  Bone Density -- N/A  Diet -- eats vegetables, avoid fried foods Exercise -- very limited  Sleep habits -- no major concerns Mood -- overall normal  UTD with dentist? - yes UTD with eye doctor? - yes  Weight history: Wt Readings from Last 10 Encounters:  04/03/24 156 lb (70.8 kg)  09/26/23 155 lb (70.3 kg)  04/08/23 153 lb (69.4 kg)  04/01/23 155 lb (70.3 kg)  07/02/22 161 lb (73 kg)  06/05/22 160 lb 4 oz (72.7 kg)  12/05/21 168 lb (76.2 kg)  10/10/21 172 lb (78 kg)  07/10/21 169 lb (76.7 kg)  06/12/21 170 lb 8 oz (77.3 kg)   Body mass index is 26.78 kg/m. Patient's last menstrual period was 03/14/2024 (exact date).  Alcohol use:  reports that she does  not currently use alcohol after a past usage of about 1.0 standard drink of alcohol per week.  Tobacco use:  Tobacco Use: Low Risk (04/03/2024)   Patient History    Smoking Tobacco Use: Never    Smokeless Tobacco Use: Never    Passive Exposure: Not on file   Eligible for lung cancer screening? no     04/03/2024    8:03 AM  Depression screen PHQ 2/9  Decreased Interest 0  Down, Depressed, Hopeless 0  PHQ - 2 Score 0     Other providers/specialists: Patient Care Team: Job Lukes, [place] as PCP - General (Physician Assistant) Rodgers Barnie RAMAN, CNM as Referring Physician (Certified Nurse Midwife)    PMHx, SurgHx, SocialHx, Medications, and Allergies were reviewed in the Visit Navigator and updated as appropriate.   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  HENT:  Negative for hearing loss, sinus pain and sore throat.   Respiratory:  Negative for cough and hemoptysis.   Cardiovascular:  Negative for chest pain, palpitations, leg swelling and PND.  Gastrointestinal:  Negative for abdominal pain, constipation, diarrhea, heartburn, nausea and vomiting.  Genitourinary:  Negative for dysuria, frequency and urgency.  Musculoskeletal:  Negative for back pain, myalgias and neck pain.  Skin:  Negative for itching and rash.   Neurological:  Negative for dizziness, tingling, seizures and headaches.  Endo/Heme/Allergies:  Negative for polydipsia.  Psychiatric/Behavioral:  Negative for depression. The patient is not nervous/anxious.     Objective:   BP 138/86 (BP Location: Left Arm, Patient Position: Sitting, Cuff Size: Normal)   Pulse 92   Temp (!) 97.4 F (36.3 C) (Temporal)   Ht 5' 4 (1.626 m)   Wt 156 lb (70.8 kg)   LMP 03/14/2024 (Exact Date)   SpO2 99%   BMI 26.78 kg/m  Body mass index is 26.78 kg/m.   General Appearance:    Alert, cooperative, no distress, appears stated age  Head:    Normocephalic, without obvious abnormality, atraumatic  Eyes:    PERRL, conjunctiva/corneas clear, EOM's intact, fundi    benign, both eyes  Ears:    Normal TM's and external ear canals, both ears  Nose:   Nares normal, septum midline, mucosa normal, no drainage    or sinus tenderness  Throat:   Lips, mucosa, and tongue normal; teeth and gums normal  Neck:   Supple, symmetrical, trachea midline, no adenopathy;    thyroid:  no enlargement/tenderness/nodules; no carotid   bruit or JVD  Back:     Symmetric, no curvature, ROM normal, no CVA tenderness  Lungs:     Clear to auscultation bilaterally, respirations unlabored  Chest Wall:    No tenderness or deformity   Heart:    Regular rate and rhythm, S1 and S2 normal, no murmur, rub or gallop  Breast Exam:    Deferred  Abdomen:     Soft, non-tender, bowel sounds active all four quadrants,    no masses, no organomegaly  Genitalia:    Deferred   Extremities:   Extremities normal, atraumatic, no cyanosis or edema  Pulses:   2+ and symmetric all extremities  Skin:   Skin color, texture, turgor normal, no rashes or lesions  Lymph nodes:   Cervical, supraclavicular, and axillary nodes normal  Neurologic:   CNII-XII intact, normal strength, sensation and reflexes    throughout    Assessment/Plan:   Assessment and Plan    Woman's Wellness Visit Routine visit with  stress related to job changes. Blood pressure slightly elevated. Pap smears and mammograms current. No issues with birth control or menstrual cycle. Diet moderate, exercise limited. - Ordered blood work. - Encouraged regular exercise. - Discussed job role changes to reduce stress.  Essential hypertension Blood pressure slightly elevated, no issues with current antihypertensive regimen.  Continue amlodipine  5 mg daily     Oral contraceptive pill surveillance  Stable on Incassia  Blood pressure well controlled Continue management    Lucie Buttner, PA-C [facility]           [1]  Social History Tobacco Use   Smoking status: Never   Smokeless tobacco: Never  Vaping Use   Vaping status: Never Used  Substance Use Topics   Alcohol use: Not Currently    Alcohol/week: 1.0 standard drink of alcohol    Types: 1 Glasses of wine per week   Drug use: Never

## 2024-04-03 NOTE — Addendum Note (Signed)
 Addended by: Thoren Hosang J on: 04/03/2024 08:39 AM   Modules accepted: Orders

## 2024-04-06 ENCOUNTER — Other Ambulatory Visit

## 2024-04-06 ENCOUNTER — Ambulatory Visit: Payer: Self-pay | Admitting: Physician Assistant

## 2024-04-06 DIAGNOSIS — Z0000 Encounter for general adult medical examination without abnormal findings: Secondary | ICD-10-CM

## 2024-04-06 DIAGNOSIS — Z13220 Encounter for screening for lipoid disorders: Secondary | ICD-10-CM

## 2024-04-06 LAB — CBC WITH DIFFERENTIAL/PLATELET
Basophils Absolute: 0 K/uL (ref 0.0–0.1)
Basophils Relative: 0.5 % (ref 0.0–3.0)
Eosinophils Absolute: 0.1 K/uL (ref 0.0–0.7)
Eosinophils Relative: 1.4 % (ref 0.0–5.0)
HCT: 41.4 % (ref 36.0–46.0)
Hemoglobin: 14.3 g/dL (ref 12.0–15.0)
Lymphocytes Relative: 26.8 % (ref 12.0–46.0)
Lymphs Abs: 1.6 K/uL (ref 0.7–4.0)
MCHC: 34.6 g/dL (ref 30.0–36.0)
MCV: 94.1 fl (ref 78.0–100.0)
Monocytes Absolute: 0.4 K/uL (ref 0.1–1.0)
Monocytes Relative: 7 % (ref 3.0–12.0)
Neutro Abs: 3.8 K/uL (ref 1.4–7.7)
Neutrophils Relative %: 64.3 % (ref 43.0–77.0)
Platelets: 224 K/uL (ref 150.0–400.0)
RBC: 4.4 Mil/uL (ref 3.87–5.11)
RDW: 12.5 % (ref 11.5–15.5)
WBC: 6 K/uL (ref 4.0–10.5)

## 2024-04-06 LAB — COMPREHENSIVE METABOLIC PANEL WITH GFR
ALT: 16 U/L (ref 0–35)
AST: 13 U/L (ref 0–37)
Albumin: 4.6 g/dL (ref 3.5–5.2)
Alkaline Phosphatase: 46 U/L (ref 39–117)
BUN: 8 mg/dL (ref 6–23)
CO2: 26 meq/L (ref 19–32)
Calcium: 9.4 mg/dL (ref 8.4–10.5)
Chloride: 104 meq/L (ref 96–112)
Creatinine, Ser: 0.65 mg/dL (ref 0.40–1.20)
GFR: 109.91 mL/min (ref >60.00)
Glucose, Bld: 60 mg/dL — ABNORMAL LOW (ref 70–99)
Potassium: 4.1 meq/L (ref 3.5–5.1)
Sodium: 139 meq/L (ref 135–145)
Total Bilirubin: 0.3 mg/dL (ref 0.2–1.2)
Total Protein: 7 g/dL (ref 6.0–8.3)

## 2024-04-06 LAB — LIPID PANEL
Cholesterol: 152 mg/dL (ref 0–200)
HDL: 65.9 mg/dL (ref >39.00)
LDL Cholesterol: 71 mg/dL (ref 0–99)
NonHDL: 86.38
Total CHOL/HDL Ratio: 2
Triglycerides: 77 mg/dL (ref 0.0–149.0)
VLDL: 15.4 mg/dL (ref 0.0–40.0)

## 2024-04-19 ENCOUNTER — Other Ambulatory Visit (HOSPITAL_BASED_OUTPATIENT_CLINIC_OR_DEPARTMENT_OTHER): Payer: Self-pay

## 2024-11-30 ENCOUNTER — Ambulatory Visit: Admitting: Dermatology

## 2025-04-05 ENCOUNTER — Encounter: Admitting: Physician Assistant
